# Patient Record
Sex: Female | Born: 1965 | Race: White | Hispanic: No | Marital: Single | Smoking: Current every day smoker
Health system: Southern US, Community
[De-identification: ages and names within clinical notes are randomized; demographics above are authoritative.]

## PROBLEM LIST (undated history)

## (undated) DIAGNOSIS — B269 Mumps without complication: Secondary | ICD-10-CM

## (undated) DIAGNOSIS — Z9109 Other allergy status, other than to drugs and biological substances: Secondary | ICD-10-CM

## (undated) DIAGNOSIS — K219 Gastro-esophageal reflux disease without esophagitis: Secondary | ICD-10-CM

## (undated) DIAGNOSIS — R87629 Unspecified abnormal cytological findings in specimens from vagina: Secondary | ICD-10-CM

## (undated) DIAGNOSIS — J302 Other seasonal allergic rhinitis: Secondary | ICD-10-CM

## (undated) DIAGNOSIS — B059 Measles without complication: Secondary | ICD-10-CM

## (undated) DIAGNOSIS — B019 Varicella without complication: Secondary | ICD-10-CM

## (undated) DIAGNOSIS — I10 Essential (primary) hypertension: Secondary | ICD-10-CM

## (undated) HISTORY — PX: WISDOM TOOTH EXTRACTION: SHX21

## (undated) HISTORY — DX: Unspecified abnormal cytological findings in specimens from vagina: R87.629

## (undated) HISTORY — DX: Mumps without complication: B26.9

## (undated) HISTORY — DX: Other allergy status, other than to drugs and biological substances: Z91.09

## (undated) HISTORY — DX: Gastro-esophageal reflux disease without esophagitis: K21.9

## (undated) HISTORY — DX: Other seasonal allergic rhinitis: J30.2

## (undated) HISTORY — DX: Measles without complication: B05.9

## (undated) HISTORY — PX: ESSURE TUBAL LIGATION: SUR464

## (undated) HISTORY — DX: Varicella without complication: B01.9

---
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
## 2014-02-23 ENCOUNTER — Ambulatory Visit (INDEPENDENT_AMBULATORY_CARE_PROVIDER_SITE_OTHER): Payer: 59 | Admitting: Physician Assistant

## 2014-02-23 ENCOUNTER — Encounter: Payer: Self-pay | Admitting: Physician Assistant

## 2014-02-23 VITALS — BP 128/88 | HR 82 | Temp 98.5°F | Resp 16 | Ht 64.0 in | Wt 198.0 lb

## 2014-02-23 DIAGNOSIS — J329 Chronic sinusitis, unspecified: Secondary | ICD-10-CM | POA: Insufficient documentation

## 2014-02-23 DIAGNOSIS — K219 Gastro-esophageal reflux disease without esophagitis: Secondary | ICD-10-CM | POA: Insufficient documentation

## 2014-02-23 DIAGNOSIS — Z1239 Encounter for other screening for malignant neoplasm of breast: Secondary | ICD-10-CM

## 2014-02-23 DIAGNOSIS — F429 Obsessive-compulsive disorder, unspecified: Secondary | ICD-10-CM | POA: Insufficient documentation

## 2014-02-23 DIAGNOSIS — Z1211 Encounter for screening for malignant neoplasm of colon: Secondary | ICD-10-CM

## 2014-02-23 DIAGNOSIS — Z0000 Encounter for general adult medical examination without abnormal findings: Secondary | ICD-10-CM | POA: Insufficient documentation

## 2014-02-23 LAB — CBC WITH DIFFERENTIAL/PLATELET
BASOS PCT: 1 % (ref 0–1)
Basophils Absolute: 0.1 10*3/uL (ref 0.0–0.1)
EOS ABS: 0.1 10*3/uL (ref 0.0–0.7)
EOS PCT: 1 % (ref 0–5)
HCT: 39.1 % (ref 36.0–46.0)
HEMOGLOBIN: 12.8 g/dL (ref 12.0–15.0)
Lymphocytes Relative: 31 % (ref 12–46)
Lymphs Abs: 2.9 10*3/uL (ref 0.7–4.0)
MCH: 28.6 pg (ref 26.0–34.0)
MCHC: 32.7 g/dL (ref 30.0–36.0)
MCV: 87.5 fL (ref 78.0–100.0)
MONOS PCT: 7 % (ref 3–12)
Monocytes Absolute: 0.6 10*3/uL (ref 0.1–1.0)
Neutro Abs: 5.5 10*3/uL (ref 1.7–7.7)
Neutrophils Relative %: 60 % (ref 43–77)
Platelets: 189 10*3/uL (ref 150–400)
RBC: 4.47 MIL/uL (ref 3.87–5.11)
RDW: 14.3 % (ref 11.5–15.5)
WBC: 9.2 10*3/uL (ref 4.0–10.5)

## 2014-02-23 LAB — HEPATIC FUNCTION PANEL
ALK PHOS: 78 U/L (ref 39–117)
ALT: 21 U/L (ref 0–35)
AST: 25 U/L (ref 0–37)
Albumin: 4.1 g/dL (ref 3.5–5.2)
BILIRUBIN TOTAL: 0.2 mg/dL (ref 0.2–1.2)
Bilirubin, Direct: <0.1 mg/dL (ref 0.0–0.3)
TOTAL PROTEIN: 6.7 g/dL (ref 6.0–8.3)

## 2014-02-23 LAB — LIPID PANEL
Cholesterol: 246 mg/dL — ABNORMAL HIGH (ref 0–200)
HDL: 53 mg/dL (ref >39)
LDL Cholesterol: 151 mg/dL — ABNORMAL HIGH (ref 0–99)
Total CHOL/HDL Ratio: 4.6 Ratio
Triglycerides: 209 mg/dL — ABNORMAL HIGH (ref <150)
VLDL: 42 mg/dL — ABNORMAL HIGH (ref 0–40)

## 2014-02-23 LAB — BASIC METABOLIC PANEL
BUN: 11 mg/dL (ref 6–23)
CALCIUM: 8.9 mg/dL (ref 8.4–10.5)
CO2: 28 meq/L (ref 19–32)
CREATININE: 0.65 mg/dL (ref 0.50–1.10)
Chloride: 103 mEq/L (ref 96–112)
GLUCOSE: 89 mg/dL (ref 70–99)
Potassium: 4.5 mEq/L (ref 3.5–5.3)
SODIUM: 139 meq/L (ref 135–145)

## 2014-02-23 LAB — HEMOGLOBIN A1C
Hgb A1c MFr Bld: 5.5 % (ref <5.7)
MEAN PLASMA GLUCOSE: 111 mg/dL (ref <117)

## 2014-02-23 LAB — TSH: TSH: 0.698 u[IU]/mL (ref 0.350–4.500)

## 2014-02-23 MED ORDER — AZITHROMYCIN 250 MG PO TABS: ORAL_TABLET | ORAL | Status: DC

## 2014-02-23 MED ORDER — FLUTICASONE PROPIONATE 50 MCG/ACT NA SUSP: 2.0000 | Freq: Every day | NASAL | Status: DC

## 2014-02-23 MED ORDER — OMEPRAZOLE 20 MG PO CPDR: 20.0000 mg | DELAYED_RELEASE_CAPSULE | Freq: Every day | ORAL | Status: DC

## 2014-02-23 MED ORDER — METHYLPREDNISOLONE ACETATE 40 MG/ML IJ SUSP
40.0000 mg | Freq: Once | INTRAMUSCULAR | Status: AC
  Administered 2014-02-23: 40 mg via INTRAMUSCULAR

## 2014-02-23 NOTE — Assessment & Plan Note (Signed)
History reviewed.  Referral placed for Mammogram and Colonoscopy. Immunizations UTD.  Will obtain fasting labs.  Patient to return for pelvic examination and PAP smear.

## 2014-02-23 NOTE — Progress Notes (Signed)
Pre visit review using our clinic review tool, if applicable. No additional management support is needed unless otherwise documented below in the visit note/SLS  

## 2014-02-23 NOTE — Assessment & Plan Note (Signed)
Symptoms controlled with Prozac.

## 2014-02-23 NOTE — Assessment & Plan Note (Signed)
Rx Azithromycin.  Increase fluids. Rest.  Saline nasal spray.  Flonase and Claritin.  Humidifier in bedroom.  Depo-Medrol injection given.

## 2014-02-23 NOTE — Patient Instructions (Signed)
Please obtain labs.  I will call you with your results. Please start Omeprazole daily.  Decrease alcohol intake.  Read information below on smoking cessation.  STOP BC Powders.  Use tylenol if needed for headache.  For sinusitis -- Increase fluid intake.  Rest.  Take anitbiotic as prescribed.  Use Flonase daily.  Place a humidifier in the bedroom.  For OCD -- Continue Prozac as directed.  Will schedule follow-up based on lab results.  Smoking Cessation Quitting smoking is important to your health and has many advantages. However, it is not always easy to quit since nicotine is a very addictive drug. Often times, people try 3 times or more before being able to quit. This document explains the best ways for you to prepare to quit smoking. Quitting takes hard work and a lot of effort, but you can do it. ADVANTAGES OF QUITTING SMOKING  You will live longer, feel better, and live better.  Your body will feel the impact of quitting smoking almost immediately.  Within 20 minutes, blood pressure decreases. Your pulse returns to its normal level.  After 8 hours, carbon monoxide levels in the blood return to normal. Your oxygen level increases.  After 24 hours, the chance of having a heart attack starts to decrease. Your breath, hair, and body stop smelling like smoke.  After 48 hours, damaged nerve endings begin to recover. Your sense of taste and smell improve.  After 72 hours, the body is virtually free of nicotine. Your bronchial tubes relax and breathing becomes easier.  After 2 to 12 weeks, lungs can hold more air. Exercise becomes easier and circulation improves.  The risk of having a heart attack, stroke, cancer, or lung disease is greatly reduced.  After 1 year, the risk of coronary heart disease is cut in half.  After 5 years, the risk of stroke falls to the same as a nonsmoker.  After 10 years, the risk of lung cancer is cut in half and the risk of other cancers decreases  significantly.  After 15 years, the risk of coronary heart disease drops, usually to the level of a nonsmoker.  If you are pregnant, quitting smoking will improve your chances of having a healthy baby.  The people you live with, especially any children, will be healthier.  You will have extra money to spend on things other than cigarettes. QUESTIONS TO THINK ABOUT BEFORE ATTEMPTING TO QUIT You may want to talk about your answers with your caregiver.  Why do you want to quit?  If you tried to quit in the past, what helped and what did not?  What will be the most difficult situations for you after you quit? How will you plan to handle them?  Who can help you through the tough times? Your family? Friends? A caregiver?  What pleasures do you get from smoking? What ways can you still get pleasure if you quit? Here are some questions to ask your caregiver:  How can you help me to be successful at quitting?  What medicine do you think would be best for me and how should I take it?  What should I do if I need more help?  What is smoking withdrawal like? How can I get information on withdrawal? GET READY  Set a quit date.  Change your environment by getting rid of all cigarettes, ashtrays, matches, and lighters in your home, car, or work. Do not let people smoke in your home.  Review your past attempts to quit.  Think about what worked and what did not. GET SUPPORT AND ENCOURAGEMENT You have a better chance of being successful if you have help. You can get support in many ways.  Tell your family, friends, and co-workers that you are going to quit and need their support. Ask them not to smoke around you.  Get individual, group, or telephone counseling and support. Programs are available at [employer] and health centers. Call your local health department for information about programs in your area.  Spiritual beliefs and practices may help some smokers quit.  Download a "quit  meter" on your computer to keep track of quit statistics, such as how long you have gone without smoking, cigarettes not smoked, and money saved.  Get a self-help book about quitting smoking and staying off of tobacco. Boyle yourself from urges to smoke. Talk to someone, go for a walk, or occupy your time with a task.  Change your normal routine. Take a different route to work. Drink tea instead of coffee. Eat breakfast in a different place.  Reduce your stress. Take a hot bath, exercise, or read a book.  Plan something enjoyable to do every day. Reward yourself for not smoking.  Explore interactive web-based programs that specialize in helping you quit. GET MEDICINE AND USE IT CORRECTLY Medicines can help you stop smoking and decrease the urge to smoke. Combining medicine with the above behavioral methods and support can greatly increase your chances of successfully quitting smoking.  Nicotine replacement therapy helps deliver nicotine to your body without the negative effects and risks of smoking. Nicotine replacement therapy includes nicotine gum, lozenges, inhalers, nasal sprays, and skin patches. Some may be available over-the-counter and others require a prescription.  Antidepressant medicine helps people abstain from smoking, but how this works is unknown. This medicine is available by prescription.  Nicotinic receptor partial agonist medicine simulates the effect of nicotine in your brain. This medicine is available by prescription. Ask your caregiver for advice about which medicines to use and how to use them based on your health history. Your caregiver will tell you what side effects to look out for if you choose to be on a medicine or therapy. Carefully read the information on the package. Do not use any other product containing nicotine while using a nicotine replacement product.  RELAPSE OR DIFFICULT SITUATIONS Most relapses occur within the  first 3 months after quitting. Do not be discouraged if you start smoking again. Remember, most people try several times before finally quitting. You may have symptoms of withdrawal because your body is used to nicotine. You may crave cigarettes, be irritable, feel very hungry, cough often, get headaches, or have difficulty concentrating. The withdrawal symptoms are only temporary. They are strongest when you first quit, but they will go away within 10 14 days. To reduce the chances of relapse, try to:  Avoid drinking alcohol. Drinking lowers your chances of successfully quitting.  Reduce the amount of caffeine you consume. Once you quit smoking, the amount of caffeine in your body increases and can give you symptoms, such as a rapid heartbeat, sweating, and anxiety.  Avoid smokers because they can make you want to smoke.  Do not let weight gain distract you. Many smokers will gain weight when they quit, usually less than 10 pounds. Eat a healthy diet and stay active. You can always lose the weight gained after you quit.  Find ways to improve your mood other than smoking.  FOR MORE INFORMATION  www.smokefree.gov  Document Released: 10/14/2001 Document Revised: 04/20/2012 Document Reviewed: 01/29/2012 [facility] Patient Information 2014 [place], [place].  Preventive Care for Adults, Female A healthy lifestyle and preventive care can promote health and wellness. Preventive health guidelines for women include the following key practices.  A routine yearly physical is a good way to check with your health care provider about your health and preventive screening. It is a chance to share any concerns and updates on your health and to receive a thorough exam.  Visit your dentist for a routine exam and preventive care every 6 months. Brush your teeth twice a day and floss once a day. Good oral hygiene prevents tooth decay and gum disease.  The frequency of eye exams is based on your age, health, family  medical history, use of contact lenses, and other factors. Follow your health care provider's recommendations for frequency of eye exams.  Eat a healthy diet. Foods like vegetables, fruits, whole grains, low-fat dairy products, and lean protein foods contain the nutrients you need without too many calories. Decrease your intake of foods high in solid fats, added sugars, and salt. Eat the right amount of calories for you.Get information about a proper diet from your health care provider, if necessary.  Regular physical exercise is one of the most important things you can do for your health. Most adults should get at least 150 minutes of moderate-intensity exercise (any activity that increases your heart rate and causes you to sweat) each week. In addition, most adults need muscle-strengthening exercises on 2 or more days a week.  Maintain a healthy weight. The body mass index (BMI) is a screening tool to identify possible weight problems. It provides an estimate of body fat based on height and weight. Your health care provider can find your BMI, and can help you achieve or maintain a healthy weight.For adults 20 years and older:  A BMI below 18.5 is considered underweight.  A BMI of 18.5 to 24.9 is normal.  A BMI of 25 to 29.9 is considered overweight.  A BMI of 30 and above is considered obese.  Maintain normal blood lipids and cholesterol levels by exercising and minimizing your intake of saturated fat. Eat a balanced diet with plenty of fruit and vegetables. Blood tests for lipids and cholesterol should begin at age 46 and be repeated every 5 years. If your lipid or cholesterol levels are high, you are over 50, or you are at high risk for heart disease, you may need your cholesterol levels checked more frequently.Ongoing high lipid and cholesterol levels should be treated with medicines if diet and exercise are not working.  If you smoke, find out from your health care provider how to quit. If  you do not use tobacco, do not start.  Lung cancer screening is recommended for adults aged 61 80 years who are at high risk for developing lung cancer because of a history of smoking. A yearly low-dose CT scan of the lungs is recommended for people who have at least a 30-pack-year history of smoking and are a current smoker or have quit within the past 15 years. A pack year of smoking is smoking an average of 1 pack of cigarettes a day for 1 year (for example: 1 pack a day for 30 years or 2 packs a day for 15 years). Yearly screening should continue until the smoker has stopped smoking for at least 15 years. Yearly screening should be stopped for people who develop  a health problem that would prevent them from having lung cancer treatment.  If you are pregnant, do not drink alcohol. If you are breastfeeding, be very cautious about drinking alcohol. If you are not pregnant and choose to drink alcohol, do not have more than 1 drink per day. One drink is considered to be 12 ounces (355 mL) of beer, 5 ounces (148 mL) of wine, or 1.5 ounces (44 mL) of liquor.  Avoid use of street drugs. Do not share needles with anyone. Ask for help if you need support or instructions about stopping the use of drugs.  High blood pressure causes heart disease and increases the risk of stroke. Your blood pressure should be checked at least every 1 to 2 years. Ongoing high blood pressure should be treated with medicines if weight loss and exercise do not work.  If you are 8 50 years old, ask your health care provider if you should take aspirin to prevent strokes.  Diabetes screening involves taking a blood sample to check your fasting blood sugar level. This should be done once every 3 years, after age 89, if you are within normal weight and without risk factors for diabetes. Testing should be considered at a younger age or be carried out more frequently if you are overweight and have at least 1 risk factor for  diabetes.  Breast cancer screening is essential preventive care for women. You should practice "breast self-awareness." This means understanding the normal appearance and feel of your breasts and may include breast self-examination. Any changes detected, no matter how small, should be reported to a health care provider. Women in their 29s and 30s should have a clinical breast exam (CBE) by a health care provider as part of a regular health exam every 1 to 3 years. After age 40, women should have a CBE every year. Starting at age 52, women should consider having a mammogram (breast X-ray test) every year. Women who have a family history of breast cancer should talk to their health care provider about genetic screening. Women at a high risk of breast cancer should talk to their health care providers about having an MRI and a mammogram every year.  Breast cancer gene (BRCA)-related cancer risk assessment is recommended for women who have family members with BRCA-related cancers. BRCA-related cancers include breast, ovarian, tubal, and peritoneal cancers. Having family members with these cancers may be associated with an increased risk for harmful changes (mutations) in the breast cancer genes BRCA1 and BRCA2. Results of the assessment will determine the need for genetic counseling and BRCA1 and BRCA2 testing.  The Pap test is a screening test for cervical cancer. A Pap test can show cell changes on the cervix that might become cervical cancer if left untreated. A Pap test is a procedure in which cells are obtained and examined from the lower end of the uterus (cervix).  Women should have a Pap test starting at age 41.  Between ages 27 and 72, Pap tests should be repeated every 2 years.  Beginning at age 48, you should have a Pap test every 3 years as long as the past 3 Pap tests have been normal.  Some women have medical problems that increase the chance of getting cervical cancer. Talk to your health  care provider about these problems. It is especially important to talk to your health care provider if a new problem develops soon after your last Pap test. In these cases, your health care provider may recommend  more frequent screening and Pap tests.  The above recommendations are the same for women who have or have not gotten the vaccine for human papillomavirus (HPV).  If you had a hysterectomy for a problem that was not cancer or a condition that could lead to cancer, then you no longer need Pap tests. Even if you no longer need a Pap test, a regular exam is a good idea to make sure no other problems are starting.  If you are between ages 12 and 28 years, and you have had normal Pap tests going back 10 years, you no longer need Pap tests. Even if you no longer need a Pap test, a regular exam is a good idea to make sure no other problems are starting.  If you have had past treatment for cervical cancer or a condition that could lead to cancer, you need Pap tests and screening for cancer for at least 20 years after your treatment.  If Pap tests have been discontinued, risk factors (such as a new sexual partner) need to be reassessed to determine if screening should be resumed.  The HPV test is an additional test that may be used for cervical cancer screening. The HPV test looks for the virus that can cause the cell changes on the cervix. The cells collected during the Pap test can be tested for HPV. The HPV test could be used to screen women aged 82 years and older, and should be used in women of any age who have unclear Pap test results. After the age of 85, women should have HPV testing at the same frequency as a Pap test.  Colorectal cancer can be detected and often prevented. Most routine colorectal cancer screening begins at the age of 105 years and continues through age 56 years. However, your health care provider may recommend screening at an earlier age if you have risk factors for colon  cancer. On a yearly basis, your health care provider may provide home test kits to check for hidden blood in the stool. Use of a small camera at the end of a tube, to directly examine the colon (sigmoidoscopy or colonoscopy), can detect the earliest forms of colorectal cancer. Talk to your health care provider about this at age 5, when routine screening begins. Direct exam of the colon should be repeated every 5 10 years through age 39 years, unless early forms of pre-cancerous polyps or small growths are found.  People who are at an increased risk for hepatitis B should be screened for this virus. You are considered at high risk for hepatitis B if:  You were born in a country where hepatitis B occurs often. Talk with your health care provider about which countries are considered high risk.  Your parents were born in a high-risk country and you have not received a shot to protect against hepatitis B (hepatitis B vaccine).  You have HIV or AIDS.  You use needles to inject street drugs.  You live with, or have sex with, someone who has Hepatitis B.  You get hemodialysis treatment.  You take certain medicines for conditions like cancer, organ transplantation, and autoimmune conditions.  Hepatitis C blood testing is recommended for all people born from [birth year] through 1965 and any individual with known risks for hepatitis C.  Practice safe sex. Use condoms and avoid high-risk sexual practices to reduce the spread of sexually transmitted infections (STIs). STIs include gonorrhea, chlamydia, syphilis, trichomonas, herpes, HPV, and human immunodeficiency virus (HIV). Herpes, HIV,  and HPV are viral illnesses that have no cure. They can result in disability, cancer, and death. Sexually active women aged 59 years and younger should be checked for chlamydia. Older women with new or multiple partners should also be tested for chlamydia. Testing for other STIs is recommended if you are sexually active and at  increased risk.  Osteoporosis is a disease in which the bones lose minerals and strength with aging. This can result in serious bone fractures or breaks. The risk of osteoporosis can be identified using a bone density scan. Women ages 55 years and over and women at risk for fractures or osteoporosis should discuss screening with their health care providers. Ask your health care provider whether you should take a calcium supplement or vitamin D to reduce the rate of osteoporosis.  Menopause can be associated with physical symptoms and risks. Hormone replacement therapy is available to decrease symptoms and risks. You should talk to your health care provider about whether hormone replacement therapy is right for you.  Use sunscreen. Apply sunscreen liberally and repeatedly throughout the day. You should seek shade when your shadow is shorter than you. Protect yourself by wearing long sleeves, pants, a wide-brimmed hat, and sunglasses year round, whenever you are outdoors.  Once a month, do a whole body skin exam, using a mirror to look at the skin on your back. Tell your health care provider of new moles, moles that have irregular borders, moles that are larger than a pencil eraser, or moles that have changed in shape or color.  Stay current with required vaccines (immunizations).  Influenza vaccine. All adults should be immunized every year.  Tetanus, diphtheria, and acellular pertussis (Td, Tdap) vaccine. Pregnant women should receive 1 dose of Tdap vaccine during each pregnancy. The dose should be obtained regardless of the length of time since the last dose. Immunization is preferred during the 27th 36th week of gestation. An adult who has not previously received Tdap or who does not know her vaccine status should receive 1 dose of Tdap. This initial dose should be followed by tetanus and diphtheria toxoids (Td) booster doses every 10 years. Adults with an unknown or incomplete history of completing  a 3-dose immunization series with Td-containing vaccines should begin or complete a primary immunization series including a Tdap dose. Adults should receive a Td booster every 10 years.  Varicella vaccine. An adult without evidence of immunity to varicella should receive 2 doses or a second dose if she has previously received 1 dose. Pregnant females who do not have evidence of immunity should receive the first dose after pregnancy. This first dose should be obtained before leaving the health care facility. The second dose should be obtained 4 8 weeks after the first dose.  Human papillomavirus (HPV) vaccine. Females aged 41 26 years who have not received the vaccine previously should obtain the 3-dose series. The vaccine is not recommended for use in pregnant females. However, pregnancy testing is not needed before receiving a dose. If a female is found to be pregnant after receiving a dose, no treatment is needed. In that case, the remaining doses should be delayed until after the pregnancy. Immunization is recommended for any person with an immunocompromised condition through the age of 75 years if she did not get any or all doses earlier. During the 3-dose series, the second dose should be obtained 4 8 weeks after the first dose. The third dose should be obtained 24 weeks after the first dose and  16 weeks after the second dose.  Zoster vaccine. One dose is recommended for adults aged 62 years or older unless certain conditions are present.  Measles, mumps, and rubella (MMR) vaccine. Adults born before [birth year] generally are considered immune to measles and mumps. Adults born in [birth year] or later should have 1 or more doses of MMR vaccine unless there is a contraindication to the vaccine or there is laboratory evidence of immunity to each of the three diseases. A routine second dose of MMR vaccine should be obtained at least 28 days after the first dose for students attending postsecondary schools, health care  workers, or international travelers. People who received inactivated measles vaccine or an unknown type of measles vaccine during 1963 1967 should receive 2 doses of MMR vaccine. People who received inactivated mumps vaccine or an unknown type of mumps vaccine before 1979 and are at high risk for mumps infection should consider immunization with 2 doses of MMR vaccine. For females of childbearing age, rubella immunity should be determined. If there is no evidence of immunity, females who are not pregnant should be vaccinated. If there is no evidence of immunity, females who are pregnant should delay immunization until after pregnancy. Unvaccinated health care workers born before [birth year] who lack laboratory evidence of measles, mumps, or rubella immunity or laboratory confirmation of disease should consider measles and mumps immunization with 2 doses of MMR vaccine or rubella immunization with 1 dose of MMR vaccine.  Pneumococcal 13-valent conjugate (PCV13) vaccine. When indicated, a person who is uncertain of her immunization history and has no record of immunization should receive the PCV13 vaccine. An adult aged 62 years or older who has certain medical conditions and has not been previously immunized should receive 1 dose of PCV13 vaccine. This PCV13 should be followed with a dose of pneumococcal polysaccharide (PPSV23) vaccine. The PPSV23 vaccine dose should be obtained at least 8 weeks after the dose of PCV13 vaccine. An adult aged 19 years or older who has certain medical conditions and previously received 1 or more doses of PPSV23 vaccine should receive 1 dose of PCV13. The PCV13 vaccine dose should be obtained 1 or more years after the last PPSV23 vaccine dose.  Pneumococcal polysaccharide (PPSV23) vaccine. When PCV13 is also indicated, PCV13 should be obtained first. All adults aged 47 years and older should be immunized. An adult younger than age 52 years who has certain medical conditions should be  immunized. Any person who resides in a nursing home or long-term care facility should be immunized. An adult smoker should be immunized. People with an immunocompromised condition and certain other conditions should receive both PCV13 and PPSV23 vaccines. People with human immunodeficiency virus (HIV) infection should be immunized as soon as possible after diagnosis. Immunization during chemotherapy or radiation therapy should be avoided. Routine use of PPSV23 vaccine is not recommended for American Indians, [place] Natives, or people younger than 65 years unless there are medical conditions that require PPSV23 vaccine. When indicated, people who have unknown immunization and have no record of immunization should receive PPSV23 vaccine. One-time revaccination 5 years after the first dose of PPSV23 is recommended for people aged 14 64 years who have chronic kidney failure, nephrotic syndrome, asplenia, or immunocompromised conditions. People who received 1 2 doses of PPSV23 before age 82 years should receive another dose of PPSV23 vaccine at age 90 years or later if at least 5 years have passed since the previous dose. Doses of PPSV23 are not needed for people immunized  with PPSV23 at or after age 106 years.  Meningococcal vaccine. Adults with asplenia or persistent complement component deficiencies should receive 2 doses of quadrivalent meningococcal conjugate (MenACWY-D) vaccine. The doses should be obtained at least 2 months apart. Microbiologists working with certain meningococcal bacteria, [employer] recruits, people at risk during an outbreak, and people who travel to or live in countries with a high rate of meningitis should be immunized. A first-year college student up through age 73 years who is living in a residence hall should receive a dose if she did not receive a dose on or after her 16th birthday. Adults who have certain high-risk conditions should receive one or more doses of vaccine.  Hepatitis A  vaccine. Adults who wish to be protected from this disease, have certain high-risk conditions, work with hepatitis A-infected animals, work in hepatitis A research labs, or travel to or work in countries with a high rate of hepatitis A should be immunized. Adults who were previously unvaccinated and who anticipate close contact with an international adoptee during the first 60 days after arrival in the [place] from a country with a high rate of hepatitis A should be immunized.  Hepatitis B vaccine. Adults who wish to be protected from this disease, have certain high-risk conditions, may be exposed to blood or other infectious body fluids, are household contacts or sex partners of hepatitis B positive people, are clients or workers in certain care facilities, or travel to or work in countries with a high rate of hepatitis B should be immunized.  Haemophilus influenzae type b (Hib) vaccine. A previously unvaccinated person with asplenia or sickle cell disease or having a scheduled splenectomy should receive 1 dose of Hib vaccine. Regardless of previous immunization, a recipient of a hematopoietic stem cell transplant should receive a 3-dose series 6 12 months after her successful transplant. Hib vaccine is not recommended for adults with HIV infection. Preventive Services / Frequency Ages 54 to 39years  Blood pressure check.** / Every 1 to 2 years.  Lipid and cholesterol check.** / Every 5 years beginning at age 69.  Clinical breast exam.** / Every 3 years for women in their 5s and 15s.  BRCA-related cancer risk assessment.** / For women who have family members with a BRCA-related cancer (breast, ovarian, tubal, or peritoneal cancers).  Pap test.** / Every 2 years from ages 27 through 28. Every 3 years starting at age 64 through age 19 or 43 with a history of 3 consecutive normal Pap tests.  HPV screening.** / Every 3 years from ages 25 through ages 77 to 68 with a history of 3 consecutive  normal Pap tests.  Hepatitis C blood test.** / For any individual with known risks for hepatitis C.  Skin self-exam. / Monthly.  Influenza vaccine. / Every year.  Tetanus, diphtheria, and acellular pertussis (Tdap, Td) vaccine.** / Consult your health care provider. Pregnant women should receive 1 dose of Tdap vaccine during each pregnancy. 1 dose of Td every 10 years.  Varicella vaccine.** / Consult your health care provider. Pregnant females who do not have evidence of immunity should receive the first dose after pregnancy.  HPV vaccine. / 3 doses over 6 months, if 19 and younger. The vaccine is not recommended for use in pregnant females. However, pregnancy testing is not needed before receiving a dose.  Measles, mumps, rubella (MMR) vaccine.** / You need at least 1 dose of MMR if you were born in 1957 or later. You may also need a  2nd dose. For females of childbearing age, rubella immunity should be determined. If there is no evidence of immunity, females who are not pregnant should be vaccinated. If there is no evidence of immunity, females who are pregnant should delay immunization until after pregnancy.  Pneumococcal 13-valent conjugate (PCV13) vaccine.** / Consult your health care provider.  Pneumococcal polysaccharide (PPSV23) vaccine.** / 1 to 2 doses if you smoke cigarettes or if you have certain conditions.  Meningococcal vaccine.** / 1 dose if you are age 26 to 64 years and a Market researcher living in a residence hall, or have one of several medical conditions, you need to get vaccinated against meningococcal disease. You may also need additional booster doses.  Hepatitis A vaccine.** / Consult your health care provider.  Hepatitis B vaccine.** / Consult your health care provider.  Haemophilus influenzae type b (Hib) vaccine.** / Consult your health care provider. Ages 24 to 64years  Blood pressure check.** / Every 1 to 2 years.  Lipid and cholesterol check.** /  Every 5 years beginning at age 15 years.  Lung cancer screening. / Every year if you are aged 22 80 years and have a 30-pack-year history of smoking and currently smoke or have quit within the past 15 years. Yearly screening is stopped once you have quit smoking for at least 15 years or develop a health problem that would prevent you from having lung cancer treatment.  Clinical breast exam.** / Every year after age 14 years.  BRCA-related cancer risk assessment.** / For women who have family members with a BRCA-related cancer (breast, ovarian, tubal, or peritoneal cancers).  Mammogram.** / Every year beginning at age 53 years and continuing for as long as you are in good health. Consult with your health care provider.  Pap test.** / Every 3 years starting at age 44 years through age 87 or 69 years with a history of 3 consecutive normal Pap tests.  HPV screening.** / Every 3 years from ages 75 years through ages 64 to 79 years with a history of 3 consecutive normal Pap tests.  Fecal occult blood test (FOBT) of stool. / Every year beginning at age 34 years and continuing until age 7 years. You may not need to do this test if you get a colonoscopy every 10 years.  Flexible sigmoidoscopy or colonoscopy.** / Every 5 years for a flexible sigmoidoscopy or every 10 years for a colonoscopy beginning at age 26 years and continuing until age 65 years.  Hepatitis C blood test.** / For all people born from [birth year] through 1965 and any individual with known risks for hepatitis C.  Skin self-exam. / Monthly.  Influenza vaccine. / Every year.  Tetanus, diphtheria, and acellular pertussis (Tdap/Td) vaccine.** / Consult your health care provider. Pregnant women should receive 1 dose of Tdap vaccine during each pregnancy. 1 dose of Td every 10 years.  Varicella vaccine.** / Consult your health care provider. Pregnant females who do not have evidence of immunity should receive the first dose after  pregnancy.  Zoster vaccine.** / 1 dose for adults aged 79 years or older.  Measles, mumps, rubella (MMR) vaccine.** / You need at least 1 dose of MMR if you were born in 1957 or later. You may also need a 2nd dose. For females of childbearing age, rubella immunity should be determined. If there is no evidence of immunity, females who are not pregnant should be vaccinated. If there is no evidence of immunity, females who are pregnant should delay  immunization until after pregnancy.  Pneumococcal 13-valent conjugate (PCV13) vaccine.** / Consult your health care provider.  Pneumococcal polysaccharide (PPSV23) vaccine.** / 1 to 2 doses if you smoke cigarettes or if you have certain conditions.  Meningococcal vaccine.** / Consult your health care provider.  Hepatitis A vaccine.** / Consult your health care provider.  Hepatitis B vaccine.** / Consult your health care provider.  Haemophilus influenzae type b (Hib) vaccine.** / Consult your health care provider. Ages 52 years and over  Blood pressure check.** / Every 1 to 2 years.  Lipid and cholesterol check.** / Every 5 years beginning at age 67 years.  Lung cancer screening. / Every year if you are aged 1 80 years and have a 30-pack-year history of smoking and currently smoke or have quit within the past 15 years. Yearly screening is stopped once you have quit smoking for at least 15 years or develop a health problem that would prevent you from having lung cancer treatment.  Clinical breast exam.** / Every year after age 83 years.  BRCA-related cancer risk assessment.** / For women who have family members with a BRCA-related cancer (breast, ovarian, tubal, or peritoneal cancers).  Mammogram.** / Every year beginning at age 74 years and continuing for as long as you are in good health. Consult with your health care provider.  Pap test.** / Every 3 years starting at age 59 years through age 34 or 93 years with 3 consecutive normal Pap  tests. Testing can be stopped between 65 and 70 years with 3 consecutive normal Pap tests and no abnormal Pap or HPV tests in the past 10 years.  HPV screening.** / Every 3 years from ages 59 years through ages 9 or 56 years with a history of 3 consecutive normal Pap tests. Testing can be stopped between 65 and 70 years with 3 consecutive normal Pap tests and no abnormal Pap or HPV tests in the past 10 years.  Fecal occult blood test (FOBT) of stool. / Every year beginning at age 61 years and continuing until age 89 years. You may not need to do this test if you get a colonoscopy every 10 years.  Flexible sigmoidoscopy or colonoscopy.** / Every 5 years for a flexible sigmoidoscopy or every 10 years for a colonoscopy beginning at age 110 years and continuing until age 29 years.  Hepatitis C blood test.** / For all people born from [birth year] through 1967 and any individual with known risks for hepatitis C.  Osteoporosis screening.** / A one-time screening for women ages 51 years and over and women at risk for fractures or osteoporosis.  Skin self-exam. / Monthly.  Influenza vaccine. / Every year.  Tetanus, diphtheria, and acellular pertussis (Tdap/Td) vaccine.** / 1 dose of Td every 10 years.  Varicella vaccine.** / Consult your health care provider.  Zoster vaccine.** / 1 dose for adults aged 44 years or older.  Pneumococcal 13-valent conjugate (PCV13) vaccine.** / Consult your health care provider.  Pneumococcal polysaccharide (PPSV23) vaccine.** / 1 dose for all adults aged 42 years and older.  Meningococcal vaccine.** / Consult your health care provider.  Hepatitis A vaccine.** / Consult your health care provider.  Hepatitis B vaccine.** / Consult your health care provider.  Haemophilus influenzae type b (Hib) vaccine.** / Consult your health care provider. ** Family history and personal history of risk and conditions may change your health care provider's recommendations. Document  Released: 12/16/2001 Document Revised: 08/10/2013 Document Reviewed: 03/17/2011 [facility] Patient Information 2014 [place], [place].

## 2014-02-23 NOTE — Progress Notes (Signed)
Patient presents to clinic today to establish care.  Acute Concerns: Patient complains of sinus pressure, sinus pain, headache and post-nasal drip over the past month.  Does have some seasonal allergies.  Endorses tooth pain but denies ear pain.  Denies recent travel or sick contact.  Chronic Issues: Anxiety/OCD -- controlled with 60 mg Prozac daily  Allergic Rhinitis -- controlled with Claritin.    GERD -- has been taking zantac without adequate relief of symptoms.  Endorses indigestion.  Denies abdominal pain, nausea or vomiting.  Is a current smoker.  Does drink alcohol occasionally. Has been using BC powders.  Health Maintenance: Dental -- Overdue Vision -- Overdue Immunizations --  Endorses Tetanus within past 10 years.  2010. Colonoscopy -- Due for colonoscopy.  Referral needed. Mammogram -- Last mammogram 4-5 years ago.  Due mammogram.   PAP --  Last 5 years ago; last 3 PAPs normal.  Past Medical History  Diagnosis Date  . GERD (gastroesophageal reflux disease)   . Seasonal allergies   . Environmental allergies   . Chicken pox   . Mumps   . Measles     Past Surgical History  Procedure Laterality Date  . Tonsillectomy  1984  . Wisdom tooth extraction    . Essure tubal ligation      No current outpatient prescriptions on file prior to visit.   No current facility-administered medications on file prior to visit.    Allergies  Allergen Reactions  . Penicillins Anaphylaxis    Family History  Problem Relation Age of Onset  . Healthy Mother     Living  . Healthy Father     Living  . Diabetes Maternal Grandfather     Borderline  . COPD Maternal Aunt   . COPD Maternal Uncle   . Alzheimer's disease Maternal Grandmother   . Alzheimer's disease Paternal Grandfather   . Healthy Brother     x1  . Allergies Son     x1    History   Social History  . Marital Status: Single    Spouse Name: N/A    Number of Children: N/A  . Years of Education: N/A    Occupational History  . Not on file.   Social History Main Topics  . Smoking status: Current Every Day Smoker -- 1.00 packs/day for 30 years  . Smokeless tobacco: Never Used  . Alcohol Use: 6.0 oz/week    10 Shots of liquor per week  . Drug Use: No  . Sexual Activity: No   Other Topics Concern  . Not on file   Social History Narrative  . No narrative on file   Review of Systems  Constitutional: Negative for fever and weight loss.  HENT: Negative for ear pain, hearing loss and tinnitus.   Eyes: Negative for blurred vision, double vision and photophobia.  Respiratory: Negative for cough and shortness of breath.   Cardiovascular: Negative for chest pain and palpitations.  Gastrointestinal: Positive for heartburn. Negative for nausea, vomiting, abdominal pain, diarrhea, constipation, blood in stool and melena.  Genitourinary: Negative for dysuria, urgency, frequency, hematuria and flank pain.  Neurological: Positive for headaches. Negative for dizziness and loss of consciousness.  Endo/Heme/Allergies: Positive for environmental allergies.  Psychiatric/Behavioral: Negative for depression, suicidal ideas, hallucinations and substance abuse. The patient is nervous/anxious. The patient does not have insomnia.    BP 128/88  Pulse 82  Temp(Src) 98.5 F (36.9 C) (Oral)  Resp 16  Ht 5\' 4"  (1.626 m)  Wt 198 lb (  89.812 kg)  BMI 33.97 kg/m2  SpO2 98%  Physical Exam  Vitals reviewed. Constitutional: She is oriented to person, place, and time and well-developed, well-nourished, and in no distress.  HENT:  Head: Normocephalic and atraumatic.  Right Ear: External ear normal.  Left Ear: External ear normal.  Nose: Nose normal.  Mouth/Throat: Oropharynx is clear and moist. No oropharyngeal exudate.  TM within normal limits bilaterally.  + TTP of maxillary sinuses noted.  Eyes: Conjunctivae are normal. Pupils are equal, round, and reactive to light.  Neck: Neck supple.   Cardiovascular: Normal rate, regular rhythm, normal heart sounds and intact distal pulses.   Pulmonary/Chest: Effort normal and breath sounds normal. No respiratory distress. She has no wheezes. She has no rales. She exhibits no tenderness.  Abdominal: Soft. Bowel sounds are normal. She exhibits no distension and no mass. There is no tenderness. There is no rebound and no guarding.  Neurological: She is alert and oriented to person, place, and time.  Skin: Skin is warm and dry. No rash noted.  Psychiatric: Affect normal.   Assessment/Plan: GERD (gastroesophageal reflux disease) STOP BC powders.  Rx Omperazole.  Increase fluid intake.  Avoid late-night eating.  Elevate HOB.    Visit for preventive health examination History reviewed.  Referral placed for Mammogram and Colonoscopy. Immunizations UTD.  Will obtain fasting labs.  Patient to return for pelvic examination and PAP smear.  Sinusitis Rx Azithromycin.  Increase fluids. Rest.  Saline nasal spray.  Flonase and Claritin.  Humidifier in bedroom.  Depo-Medrol injection given.  OCD (obsessive compulsive disorder) Symptoms controlled with Prozac.

## 2014-02-23 NOTE — Assessment & Plan Note (Signed)
STOP BC powders.  Rx Omperazole.  Increase fluid intake.  Avoid late-night eating.  Elevate HOB.

## 2014-02-24 ENCOUNTER — Telehealth: Payer: Self-pay | Admitting: Physician Assistant

## 2014-02-24 LAB — URINALYSIS, ROUTINE W REFLEX MICROSCOPIC
Bilirubin Urine: NEGATIVE
Glucose, UA: NEGATIVE mg/dL
HGB URINE DIPSTICK: NEGATIVE
Ketones, ur: NEGATIVE mg/dL
Leukocytes, UA: NEGATIVE
NITRITE: NEGATIVE
Protein, ur: NEGATIVE mg/dL
Specific Gravity, Urine: 1.027 (ref 1.005–1.030)
UROBILINOGEN UA: 0.2 mg/dL (ref 0.0–1.0)
pH: 7 (ref 5.0–8.0)

## 2014-02-24 NOTE — Telephone Encounter (Signed)
Relevant patient education assigned to patient using Emmi. ° °

## 2014-03-13 ENCOUNTER — Telehealth: Payer: Self-pay | Admitting: *Deleted

## 2014-03-13 DIAGNOSIS — F988 Other specified behavioral and emotional disorders with onset usually occurring in childhood and adolescence: Secondary | ICD-10-CM

## 2014-03-13 DIAGNOSIS — F32A Depression, unspecified: Secondary | ICD-10-CM

## 2014-03-13 DIAGNOSIS — F329 Major depressive disorder, single episode, unspecified: Secondary | ICD-10-CM

## 2014-03-13 DIAGNOSIS — F419 Anxiety disorder, unspecified: Principal | ICD-10-CM

## 2014-03-13 MED ORDER — FLUOXETINE HCL 20 MG PO CAPS: 20.0000 mg | ORAL_CAPSULE | Freq: Every day | ORAL | Status: DC

## 2014-03-13 MED ORDER — FLUOXETINE HCL 40 MG PO CAPS: 40.0000 mg | ORAL_CAPSULE | Freq: Every day | ORAL | Status: DC

## 2014-03-13 NOTE — Telephone Encounter (Signed)
We did discuss this at her visit.  Her blood work looks good. I will send in refills of the Prozac to her pharmacy. I can restart the Adderall but will not be back at [facility] until Wednesday. I have to print and sign this Rx. She can pick up the prescription as early as Wednesday morning.

## 2014-03-13 NOTE — Telephone Encounter (Signed)
Received message from pt requesting refill of prozac 20 & 40mg  and wants to restart adderall 10mg . Pt states she was on adderall years ago and talked with Provider at last visit re: both medications and was told that a decision would be made after pt completed labs. Pt wants to know if Rxs can be sent?  Please advise.

## 2014-03-13 NOTE — Telephone Encounter (Signed)
Notified pt and she voices understanding. States she will call back Wednesday morning to check on status of Adderall.

## 2014-03-15 MED ORDER — AMPHETAMINE-DEXTROAMPHETAMINE 10 MG PO TABS: 10.0000 mg | ORAL_TABLET | Freq: Every day | ORAL | Status: DC

## 2014-03-15 NOTE — Telephone Encounter (Signed)
Patient has p/u Rx at front desk per log in/out book/SLS

## 2014-03-15 NOTE — Telephone Encounter (Signed)
Rx printed, signed and placed a front desk for pick up.

## 2014-03-28 ENCOUNTER — Encounter: Payer: Self-pay | Admitting: Physician Assistant

## 2014-03-29 ENCOUNTER — Ambulatory Visit: Payer: 59 | Admitting: Physician Assistant

## 2014-04-12 ENCOUNTER — Ambulatory Visit: Payer: 59 | Admitting: Physician Assistant

## 2014-04-12 DIAGNOSIS — Z0289 Encounter for other administrative examinations: Secondary | ICD-10-CM

## 2014-04-19 ENCOUNTER — Ambulatory Visit: Payer: 59 | Admitting: Physician Assistant

## 2014-04-20 ENCOUNTER — Ambulatory Visit (INDEPENDENT_AMBULATORY_CARE_PROVIDER_SITE_OTHER): Payer: 59 | Admitting: Physician Assistant

## 2014-04-20 ENCOUNTER — Encounter: Payer: Self-pay | Admitting: Physician Assistant

## 2014-04-20 VITALS — BP 116/78 | HR 83 | Temp 98.3°F | Resp 16 | Ht 64.0 in | Wt 205.8 lb

## 2014-04-20 DIAGNOSIS — F988 Other specified behavioral and emotional disorders with onset usually occurring in childhood and adolescence: Secondary | ICD-10-CM

## 2014-04-20 DIAGNOSIS — F429 Obsessive-compulsive disorder, unspecified: Secondary | ICD-10-CM

## 2014-04-20 MED ORDER — AMPHETAMINE-DEXTROAMPHETAMINE 10 MG PO TABS: 10.0000 mg | ORAL_TABLET | Freq: Every day | ORAL | Status: DC

## 2014-04-20 MED ORDER — FLUOXETINE HCL 40 MG PO CAPS: 40.0000 mg | ORAL_CAPSULE | Freq: Every day | ORAL | Status: DC

## 2014-04-20 MED ORDER — AMPHETAMINE-DEXTROAMPHETAMINE 10 MG PO TABS
10.0000 mg | ORAL_TABLET | Freq: Every day | ORAL | Status: DC
Start: 2014-04-20 — End: 2014-04-20

## 2014-04-20 MED ORDER — FLUOXETINE HCL 20 MG PO CAPS: 20.0000 mg | ORAL_CAPSULE | Freq: Every day | ORAL | Status: DC

## 2014-04-20 NOTE — Progress Notes (Signed)
Pre visit review using our clinic review tool, if applicable. No additional management support is needed unless otherwise documented below in the visit note/SLS  

## 2014-04-20 NOTE — Patient Instructions (Signed)
Please continue medications as directed.  Follow-up in 3 months.  Return sooner if you need anything.  Keep up the diet and exercise.  Our goal is 150 minutes of cardio per week!  Exercise to Stay Healthy Exercise helps you become and stay healthy. EXERCISE IDEAS AND TIPS Choose exercises that:  You enjoy.  Fit into your day. You do not need to exercise really hard to be healthy. You can do exercises at a slow or medium level and stay healthy. You can:  Stretch before and after working out.  Try yoga, Pilates, or tai chi.  Lift weights.  Walk fast, swim, jog, run, climb stairs, bicycle, dance, or rollerskate.  Take aerobic classes. Exercises that burn about 150 calories:  Running 1  miles in 15 minutes.  Playing volleyball for 45 to 60 minutes.  Washing and waxing a car for 45 to 60 minutes.  Playing touch football for 45 minutes.  Walking 1  miles in 35 minutes.  Pushing a stroller 1  miles in 30 minutes.  Playing basketball for 30 minutes.  Raking leaves for 30 minutes.  Bicycling 5 miles in 30 minutes.  Walking 2 miles in 30 minutes.  Dancing for 30 minutes.  Shoveling snow for 15 minutes.  Swimming laps for 20 minutes.  Walking up stairs for 15 minutes.  Bicycling 4 miles in 15 minutes.  Gardening for 30 to 45 minutes.  Jumping rope for 15 minutes.  Washing windows or floors for 45 to 60 minutes. Document Released: 11/22/2010 Document Revised: 01/12/2012 Document Reviewed: 11/22/2010 [facility] Patient Information 2015 [place], [place]. This information is not intended to replace advice given to you by your health care provider. Make sure you discuss any questions you have with your health care provider.

## 2014-04-21 ENCOUNTER — Telehealth: Payer: Self-pay | Admitting: Physician Assistant

## 2014-04-21 NOTE — Telephone Encounter (Signed)
Relevant patient education assigned to patient using Emmi. ° °

## 2014-04-23 DIAGNOSIS — F419 Anxiety disorder, unspecified: Secondary | ICD-10-CM

## 2014-04-23 DIAGNOSIS — F32A Depression, unspecified: Secondary | ICD-10-CM | POA: Insufficient documentation

## 2014-04-23 DIAGNOSIS — F988 Other specified behavioral and emotional disorders with onset usually occurring in childhood and adolescence: Secondary | ICD-10-CM | POA: Insufficient documentation

## 2014-04-23 DIAGNOSIS — F329 Major depressive disorder, single episode, unspecified: Secondary | ICD-10-CM | POA: Insufficient documentation

## 2014-04-23 NOTE — Assessment & Plan Note (Signed)
Well controlled.  Continue current regimen.  Vital signs within normal limits.  Follow-up 6 months.

## 2014-04-23 NOTE — Progress Notes (Signed)
Patient presents to clinic today  For medication management.  Patient currently on daily Adderall for ADD and Prozac 60 mg for OCD symptoms.  Patient endorses doing well on medications.  Denies chest pain, palpitations, anorexia, insomnia.  Denies depressed mood with SSRI.  Past Medical History  Diagnosis Date  . GERD (gastroesophageal reflux disease)   . Seasonal allergies   . Environmental allergies   . Chicken pox   . Mumps   . Measles     Current Outpatient Prescriptions on File Prior to Visit  Medication Sig Dispense Refill  . cetirizine (ZYRTEC) 10 MG tablet Take 10 mg by mouth daily.      Marland Kitchen omeprazole (PRILOSEC) 20 MG capsule Take 1 capsule (20 mg total) by mouth daily.  30 capsule  3   No current facility-administered medications on file prior to visit.    Allergies  Allergen Reactions  . Penicillins Anaphylaxis    Family History  Problem Relation Age of Onset  . Healthy Mother     Living  . Healthy Father     Living  . Diabetes Maternal Grandfather     Borderline  . COPD Maternal Aunt   . COPD Maternal Uncle   . Alzheimer's disease Maternal Grandmother   . Alzheimer's disease Paternal Grandfather   . Healthy Brother     x1  . Allergies Son     x1    History   Social History  . Marital Status: Single    Spouse Name: N/A    Number of Children: N/A  . Years of Education: N/A   Social History Main Topics  . Smoking status: Current Every Day Smoker -- 1.00 packs/day for 30 years  . Smokeless tobacco: Never Used  . Alcohol Use: 6.0 oz/week    10 Shots of liquor per week  . Drug Use: No  . Sexual Activity: No   Other Topics Concern  . None   Social History Narrative  . None    Review of Systems - See HPI.  All other ROS are negative.  BP 116/78  Pulse 83  Temp(Src) 98.3 F (36.8 C) (Oral)  Resp 16  Ht $R'5\' 4"'PU$  (1.626 m)  Wt 205 lb 12 oz (93.328 kg)  BMI 35.30 kg/m2  SpO2 97%  Physical Exam  Vitals reviewed. Constitutional: She is oriented  to person, place, and time and well-developed, well-nourished, and in no distress.  HENT:  Head: Normocephalic and atraumatic.  Eyes: Conjunctivae are normal. Pupils are equal, round, and reactive to light.  Neck: Neck supple. No thyromegaly present.  Cardiovascular: Normal rate, regular rhythm, normal heart sounds and intact distal pulses.   Pulmonary/Chest: Effort normal and breath sounds normal. No respiratory distress. She has no wheezes. She has no rales. She exhibits no tenderness.  Neurological: She is alert and oriented to person, place, and time.  Skin: Skin is warm and dry. No rash noted.  Psychiatric: Affect normal.    Recent Results (from the past 2160 hour(s))  CBC WITH DIFFERENTIAL     Status: None   Collection Time    02/23/14  9:34 AM      Result Value Ref Range   WBC 9.2  4.0 - 10.5 K/uL   RBC 4.47  3.87 - 5.11 MIL/uL   Hemoglobin 12.8  12.0 - 15.0 g/dL   HCT 39.1  36.0 - 46.0 %   MCV 87.5  78.0 - 100.0 fL   MCH 28.6  26.0 - 34.0 pg  MCHC 32.7  30.0 - 36.0 g/dL   RDW 14.3  11.5 - 15.5 %   Platelets 189  150 - 400 K/uL   Neutrophils Relative % 60  43 - 77 %   Neutro Abs 5.5  1.7 - 7.7 K/uL   Lymphocytes Relative 31  12 - 46 %   Lymphs Abs 2.9  0.7 - 4.0 K/uL   Monocytes Relative 7  3 - 12 %   Monocytes Absolute 0.6  0.1 - 1.0 K/uL   Eosinophils Relative 1  0 - 5 %   Eosinophils Absolute 0.1  0.0 - 0.7 K/uL   Basophils Relative 1  0 - 1 %   Basophils Absolute 0.1  0.0 - 0.1 K/uL   Smear Review Criteria for review not met    BASIC METABOLIC PANEL     Status: None   Collection Time    02/23/14  9:34 AM      Result Value Ref Range   Sodium 139  135 - 145 mEq/L   Potassium 4.5  3.5 - 5.3 mEq/L   Chloride 103  96 - 112 mEq/L   CO2 28  19 - 32 mEq/L   Glucose, Bld 89  70 - 99 mg/dL   BUN 11  6 - 23 mg/dL   Creat 0.65  0.50 - 1.10 mg/dL   Calcium 8.9  8.4 - 10.5 mg/dL  HEPATIC FUNCTION PANEL     Status: None   Collection Time    02/23/14  9:34 AM       Result Value Ref Range   Total Bilirubin 0.2  0.2 - 1.2 mg/dL   Bilirubin, Direct <0.1  0.0 - 0.3 mg/dL   Indirect Bilirubin NOT CALC  0.2 - 1.2 mg/dL   Alkaline Phosphatase 78  39 - 117 U/L   AST 25  0 - 37 U/L   ALT 21  0 - 35 U/L   Total Protein 6.7  6.0 - 8.3 g/dL   Albumin 4.1  3.5 - 5.2 g/dL  TSH     Status: None   Collection Time    02/23/14  9:34 AM      Result Value Ref Range   TSH 0.698  0.350 - 4.500 uIU/mL  HEMOGLOBIN A1C     Status: None   Collection Time    02/23/14  9:34 AM      Result Value Ref Range   Hemoglobin A1C 5.5  <5.7 %   Comment:                                                                            According to the ADA Clinical Practice Recommendations for 2011, when     HbA1c is used as a screening test:             >=6.5%   Diagnostic of Diabetes Mellitus                (if abnormal result is confirmed)           5.7-6.4%   Increased risk of developing Diabetes Mellitus           References:Diagnosis and Classification of Diabetes Mellitus,Diabetes  MCNO,7096,28(ZMOQH 1):S62-S69 and Standards of Medical Care in             Diabetes - 2011,Diabetes UTML,4650,35 (Suppl 1):S11-S61.         Mean Plasma Glucose 111  <117 mg/dL  URINALYSIS, ROUTINE W REFLEX MICROSCOPIC     Status: None   Collection Time    02/23/14  9:34 AM      Result Value Ref Range   Color, Urine YELLOW  YELLOW   APPearance CLEAR  CLEAR   Specific Gravity, Urine 1.027  1.005 - 1.030   pH 7.0  5.0 - 8.0   Glucose, UA NEG  NEG mg/dL   Bilirubin Urine NEG  NEG   Ketones, ur NEG  NEG mg/dL   Hgb urine dipstick NEG  NEG   Protein, ur NEG  NEG mg/dL   Urobilinogen, UA 0.2  0.0 - 1.0 mg/dL   Nitrite NEG  NEG   Leukocytes, UA NEG  NEG  LIPID PANEL     Status: Abnormal   Collection Time    02/23/14  9:34 AM      Result Value Ref Range   Cholesterol 246 (*) 0 - 200 mg/dL   Comment: ATP III Classification:           < 200        mg/dL        Desirable          200 - 239      mg/dL        Borderline High          >= 240        mg/dL        High         Triglycerides 209 (*) <150 mg/dL   HDL 53  >39 mg/dL   Total CHOL/HDL Ratio 4.6     VLDL 42 (*) 0 - 40 mg/dL   LDL Cholesterol 151 (*) 0 - 99 mg/dL   Comment:       Total Cholesterol/HDL Ratio:CHD Risk                            Coronary Heart Disease Risk Table                                            Men       Women              1/2 Average Risk              3.4        3.3                  Average Risk              5.0        4.4               2X Average Risk              9.6        7.1               3X Average Risk             23.4       11.0     Use the calculated Patient Ratio above  and the CHD Risk table      to determine the patient's CHD Risk.     ATP III Classification (LDL):           < 100        mg/dL         Optimal          100 - 129     mg/dL         Near or Above Optimal          130 - 159     mg/dL         Borderline High          160 - 189     mg/dL         High           > 190        mg/dL         Very High          Assessment/Plan: OCD (obsessive compulsive disorder) Well-controlled.  Continue current regimen.  Medications refilled.  Follow-up 6 months.  ADD (attention deficit disorder) Well controlled.  Continue current regimen.  Vital signs within normal limits.  Follow-up 6 months.

## 2014-04-23 NOTE — Assessment & Plan Note (Signed)
Well-controlled.  Continue current regimen.  Medications refilled.  Follow-up 6 months.

## 2014-05-29 ENCOUNTER — Telehealth: Payer: Self-pay | Admitting: Physician Assistant

## 2014-05-29 NOTE — Telephone Encounter (Signed)
Last Rx sent to pharmacy 04.23.15, #[id number]; verified this with pharmacist, not due until 08.23.15, too early for request/SLS

## 2014-05-29 NOTE — Telephone Encounter (Signed)
Requesting refill on Omeprazole 20mg  take 1 cap by mouth daily, please call into [facility] [phone] Fax [phone]

## 2014-07-21 ENCOUNTER — Ambulatory Visit: Payer: 59 | Admitting: Physician Assistant

## 2014-08-03 ENCOUNTER — Telehealth: Payer: Self-pay | Admitting: Physician Assistant

## 2014-08-03 DIAGNOSIS — F988 Other specified behavioral and emotional disorders with onset usually occurring in childhood and adolescence: Secondary | ICD-10-CM

## 2014-08-03 NOTE — Telephone Encounter (Signed)
Caller name: Dianca Relation to pt: Call back number:[phone]   Reason for call:  Pt has apt scheduled for 10/1, but is out of Rx amphetamine-dextroamphetamine (ADDERALL) 10 MG tablet and is requesting a refill.  Thanks.

## 2014-08-04 MED ORDER — AMPHETAMINE-DEXTROAMPHETAMINE 10 MG PO TABS: 10.0000 mg | ORAL_TABLET | Freq: Every day | ORAL | Status: DC

## 2014-08-04 NOTE — Telephone Encounter (Signed)
Patient has appointment on 10.19.15 w/[facility], would you be willing to sign a #30 Rx on her Adderall [Last Rx was fill on or after 08.17.15]/SLS Please Advise.

## 2014-08-04 NOTE — Telephone Encounter (Signed)
Patient informed, understood Rx is ready for pick-up during regular business hours/SLS

## 2014-08-04 NOTE — Telephone Encounter (Signed)
OK to print Adderall 30 day supply

## 2014-08-15 ENCOUNTER — Ambulatory Visit (HOSPITAL_BASED_OUTPATIENT_CLINIC_OR_DEPARTMENT_OTHER)
Admission: RE | Admit: 2014-08-15 | Discharge: 2014-08-15 | Disposition: A | Discharge status: Home / Self Care | Payer: 59 | Source: Ambulatory Visit | Attending: Physician Assistant | Admitting: Physician Assistant

## 2014-08-15 DIAGNOSIS — Z1231 Encounter for screening mammogram for malignant neoplasm of breast: Secondary | ICD-10-CM | POA: Insufficient documentation

## 2014-08-15 DIAGNOSIS — Z1239 Encounter for other screening for malignant neoplasm of breast: Secondary | ICD-10-CM

## 2014-08-17 ENCOUNTER — Other Ambulatory Visit: Payer: Self-pay | Admitting: Physician Assistant

## 2014-08-17 DIAGNOSIS — R928 Other abnormal and inconclusive findings on diagnostic imaging of breast: Secondary | ICD-10-CM

## 2014-08-21 ENCOUNTER — Ambulatory Visit: Payer: 59 | Admitting: Physician Assistant

## 2014-08-21 DIAGNOSIS — Z0289 Encounter for other administrative examinations: Secondary | ICD-10-CM

## 2014-08-29 ENCOUNTER — Ambulatory Visit (INDEPENDENT_AMBULATORY_CARE_PROVIDER_SITE_OTHER): Payer: 59 | Admitting: Physician Assistant

## 2014-08-29 ENCOUNTER — Encounter: Payer: Self-pay | Admitting: Physician Assistant

## 2014-08-29 VITALS — BP 138/109 | HR 77 | Temp 98.4°F | Resp 16 | Ht 64.0 in | Wt 195.4 lb

## 2014-08-29 DIAGNOSIS — J0100 Acute maxillary sinusitis, unspecified: Secondary | ICD-10-CM

## 2014-08-29 DIAGNOSIS — F988 Other specified behavioral and emotional disorders with onset usually occurring in childhood and adolescence: Secondary | ICD-10-CM

## 2014-08-29 DIAGNOSIS — F42 Obsessive-compulsive disorder: Secondary | ICD-10-CM

## 2014-08-29 DIAGNOSIS — F909 Attention-deficit hyperactivity disorder, unspecified type: Secondary | ICD-10-CM

## 2014-08-29 DIAGNOSIS — F429 Obsessive-compulsive disorder, unspecified: Secondary | ICD-10-CM

## 2014-08-29 MED ORDER — FLUOXETINE HCL 60 MG PO TABS: 1.0000 | ORAL_TABLET | Freq: Every day | ORAL | Status: DC

## 2014-08-29 MED ORDER — AMPHETAMINE-DEXTROAMPHETAMINE 10 MG PO TABS: 10.0000 mg | ORAL_TABLET | Freq: Every day | ORAL | Status: DC

## 2014-08-29 MED ORDER — AZITHROMYCIN 250 MG PO TABS: ORAL_TABLET | ORAL | Status: DC

## 2014-08-29 NOTE — Assessment & Plan Note (Signed)
Doing well.  Denies ADR.  Will continue current regimen.  Medications refilled.  Follow-up in 6 months.

## 2014-08-29 NOTE — Assessment & Plan Note (Signed)
Doing very well.  Continue current regimen.  Follow-up in 6 months.

## 2014-08-29 NOTE — Patient Instructions (Signed)
Please take antibiotic as directed.  Increase fluid intake.  Use Saline nasal spray.  Take a daily multivitamin. Delsym for cough.  Place a humidifier in the bedroom.  Please call or return clinic if symptoms are not improving.  Please continue other medications as directed.  Follow-up in 6 months.  Sinusitis Sinusitis is redness, soreness, and swelling (inflammation) of the paranasal sinuses. Paranasal sinuses are air pockets within the bones of your face (beneath the eyes, the middle of the forehead, or above the eyes). In healthy paranasal sinuses, mucus is able to drain out, and air is able to circulate through them by way of your nose. However, when your paranasal sinuses are inflamed, mucus and air can become trapped. This can allow bacteria and other germs to grow and cause infection. Sinusitis can develop quickly and last only a short time (acute) or continue over a long period (chronic). Sinusitis that lasts for more than 12 weeks is considered chronic.  CAUSES  Causes of sinusitis include:  Allergies.  Structural abnormalities, such as displacement of the cartilage that separates your nostrils (deviated septum), which can decrease the air flow through your nose and sinuses and affect sinus drainage.  Functional abnormalities, such as when the small hairs (cilia) that line your sinuses and help remove mucus do not work properly or are not present. SYMPTOMS  Symptoms of acute and chronic sinusitis are the same. The primary symptoms are pain and pressure around the affected sinuses. Other symptoms include:  Upper toothache.  Earache.  Headache.  Bad breath.  Decreased sense of smell and taste.  A cough, which worsens when you are lying flat.  Fatigue.  Fever.  Thick drainage from your nose, which often is green and may contain pus (purulent).  Swelling and warmth over the affected sinuses. DIAGNOSIS  Your caregiver will perform a physical exam. During the exam, your  caregiver may:  Look in your nose for signs of abnormal growths in your nostrils (nasal polyps).  Tap over the affected sinus to check for signs of infection.  View the inside of your sinuses (endoscopy) with a special imaging device with a light attached (endoscope), which is inserted into your sinuses. If your caregiver suspects that you have chronic sinusitis, one or more of the following tests may be recommended:  Allergy tests.  Nasal culture A sample of mucus is taken from your nose and sent to a lab and screened for bacteria.  Nasal cytology A sample of mucus is taken from your nose and examined by your caregiver to determine if your sinusitis is related to an allergy. TREATMENT  Most cases of acute sinusitis are related to a viral infection and will resolve on their own within 10 days. Sometimes medicines are prescribed to help relieve symptoms (pain medicine, decongestants, nasal steroid sprays, or saline sprays).  However, for sinusitis related to a bacterial infection, your caregiver will prescribe antibiotic medicines. These are medicines that will help kill the bacteria causing the infection.  Rarely, sinusitis is caused by a fungal infection. In theses cases, your caregiver will prescribe antifungal medicine. For some cases of chronic sinusitis, surgery is needed. Generally, these are cases in which sinusitis recurs more than 3 times per year, despite other treatments. HOME CARE INSTRUCTIONS   Drink plenty of water. Water helps thin the mucus so your sinuses can drain more easily.  Use a humidifier.  Inhale steam 3 to 4 times a day (for example, sit in the bathroom with the shower running).  Apply a warm, moist washcloth to your face 3 to 4 times a day, or as directed by your caregiver.  Use saline nasal sprays to help moisten and clean your sinuses.  Take over-the-counter or prescription medicines for pain, discomfort, or fever only as directed by your caregiver. SEEK  IMMEDIATE MEDICAL CARE IF:  You have increasing pain or severe headaches.  You have nausea, vomiting, or drowsiness.  You have swelling around your face.  You have vision problems.  You have a stiff neck.  You have difficulty breathing. MAKE SURE YOU:   Understand these instructions.  Will watch your condition.  Will get help right away if you are not doing well or get worse. Document Released: 10/20/2005 Document Revised: 01/12/2012 Document Reviewed: 11/04/2011 [facility] Patient Information 2014 [place], [place].

## 2014-08-29 NOTE — Assessment & Plan Note (Signed)
Rx Azithromycin.  Increase fluids.  Rest.  Saline nasal spray.  Delsym.  Humidifier in bedroom. Return precautions discussed with patient.

## 2014-08-29 NOTE — Progress Notes (Signed)
Pre visit review using our clinic review tool, if applicable. No additional management support is needed unless otherwise documented below in the visit note/SLS  

## 2014-08-29 NOTE — Progress Notes (Signed)
Patient presents to clinic today for follow-up of ADD.  Patient currently on Adderall 10 mg daily and Prozac 60 mg daily.  Endorses taking medication as directed.  Denies chest pain, palpitations, insomnia or anorexia.  Denies depressed mood, SI/HI.  Patient with recent abnormal screening mammogram showing mass/increased density of L breast.  Has scheduled diagnostic mammogram and [place] of L Breast.  Patient c/o sinus pressure, sinus pain, tooth pain and left ear pain over the past 1.5 weeks.  Denies cough or shortness of breath.  Endorses rhinorrhea. Unsure of fever. Denies recent travel or sick contact.  Past Medical History  Diagnosis Date  . GERD (gastroesophageal reflux disease)   . Seasonal allergies   . Environmental allergies   . Chicken pox   . Mumps   . Measles     Current Outpatient Prescriptions on File Prior to Visit  Medication Sig Dispense Refill  . cetirizine (ZYRTEC) 10 MG tablet Take 10 mg by mouth daily.      Marland Kitchen omeprazole (PRILOSEC) 20 MG capsule Take 1 capsule (20 mg total) by mouth daily.  30 capsule  3   No current facility-administered medications on file prior to visit.    Allergies  Allergen Reactions  . Penicillins Anaphylaxis    Family History  Problem Relation Age of Onset  . Healthy Mother     Living  . Healthy Father     Living  . Diabetes Maternal Grandfather     Borderline  . COPD Maternal Aunt   . COPD Maternal Uncle   . Alzheimer's disease Maternal Grandmother   . Alzheimer's disease Paternal Grandfather   . Healthy Brother     x1  . Allergies Son     x1    History   Social History  . Marital Status: Single    Spouse Name: N/A    Number of Children: N/A  . Years of Education: N/A   Social History Main Topics  . Smoking status: Current Every Day Smoker -- 1.00 packs/day for 30 years  . Smokeless tobacco: Never Used  . Alcohol Use: 6.0 oz/week    10 Shots of liquor per week  . Drug Use: No  . Sexual Activity: No   Other  Topics Concern  . None   Social History Narrative  . None   Review of Systems - See HPI.  All other ROS are negative.  BP 138/109  Pulse 77  Temp(Src) 98.4 F (36.9 C) (Oral)  Resp 16  Ht 5\' 4"  (1.626 m)  Wt 195 lb 6 oz (88.622 kg)  BMI 33.52 kg/m2  SpO2 100%  Physical Exam  Constitutional: She is oriented to person, place, and time and well-developed, well-nourished, and in no distress.  HENT:  Head: Normocephalic and atraumatic.  Right Ear: External ear normal.  Left Ear: External ear normal.  Nose: Right sinus exhibits maxillary sinus tenderness. Left sinus exhibits maxillary sinus tenderness.  Mouth/Throat: Uvula is midline, oropharynx is clear and moist and mucous membranes are normal. No oropharyngeal exudate.  Eyes: Conjunctivae are normal. Pupils are equal, round, and reactive to light.  Neck: Neck supple.  Cardiovascular: Normal rate, regular rhythm, normal heart sounds and intact distal pulses.   Pulmonary/Chest: Effort normal and breath sounds normal. No respiratory distress. She has no wheezes. She has no rales. She exhibits no tenderness.  Lymphadenopathy:    She has no cervical adenopathy.  Neurological: She is alert and oriented to person, place, and time.  Skin: Skin  is warm and dry. No rash noted.  Psychiatric: Affect normal.   Assessment/Plan: OCD (obsessive compulsive disorder) Doing very well.  Continue current regimen.  Follow-up in 6 months.  ADD (attention deficit disorder) Doing well.  Denies ADR.  Will continue current regimen.  Medications refilled.  Follow-up in 6 months.  Sinusitis Rx Azithromycin.  Increase fluids.  Rest.  Saline nasal spray.  Delsym.  Humidifier in bedroom. Return precautions discussed with patient.

## 2014-09-07 ENCOUNTER — Ambulatory Visit
Admission: RE | Admit: 2014-09-07 | Discharge: 2014-09-07 | Disposition: A | Discharge status: Home / Self Care | Payer: 59 | Source: Ambulatory Visit | Attending: Physician Assistant | Admitting: Physician Assistant

## 2014-09-07 DIAGNOSIS — R928 Other abnormal and inconclusive findings on diagnostic imaging of breast: Secondary | ICD-10-CM

## 2014-09-27 ENCOUNTER — Encounter: Payer: Self-pay | Admitting: Physician Assistant

## 2014-11-30 ENCOUNTER — Encounter: Payer: Self-pay | Admitting: Medical

## 2014-11-30 ENCOUNTER — Ambulatory Visit (INDEPENDENT_AMBULATORY_CARE_PROVIDER_SITE_OTHER): Payer: 59 | Admitting: Medical

## 2014-11-30 VITALS — BP 152/92 | HR 82 | Temp 98.2°F | Wt 197.2 lb

## 2014-11-30 DIAGNOSIS — T7840XA Allergy, unspecified, initial encounter: Secondary | ICD-10-CM

## 2014-11-30 MED ORDER — AMMONIUM LACTATE 12 % EX CREA: TOPICAL_CREAM | CUTANEOUS | Status: DC | PRN

## 2014-11-30 MED ORDER — HYDROXYZINE HCL 25 MG PO TABS: 25.0000 mg | ORAL_TABLET | Freq: Three times a day (TID) | ORAL | Status: DC | PRN

## 2014-11-30 MED ORDER — PREDNISONE 20 MG PO TABS: ORAL_TABLET | ORAL | Status: DC

## 2014-11-30 NOTE — Assessment & Plan Note (Signed)
Your have baseline psoriasis with new rash area in different regions.  I will treat you for allergic reaction. This treatment may help you psoriasis for short term as well.  I am prescribing prednisone, hydroxyzine, and lac-hydrin rx.

## 2014-11-30 NOTE — Progress Notes (Signed)
Subjective:    Patient ID: Lauren Case, female    DOB: Oct 25, 1966, 49 y.o.   MRN: [id number]  HPI   Pt has recent rash that itches both arms. Reports rash occurred after no known particular exposure. On review pt does not report any suspicious exposure to soaps, creams, detergents, make up, lotions, detergents, animal exposure exposure, plants or insect bites.  Pt rash is in the area of elbows and forearms. Pt reports no shortness of breath or wheezing. . Pt is not diabetic.  This occurred about one month ago.  Baseline large patches of lichinfication. She has psoriasis. So faint area on her forearms showing  Rash on anterior aspect.    Past Medical History  Diagnosis Date  . GERD (gastroesophageal reflux disease)   . Seasonal allergies   . Environmental allergies   . Chicken pox   . Mumps   . Measles     History   Social History  . Marital Status: Single    Spouse Name: N/A    Number of Children: N/A  . Years of Education: N/A   Occupational History  . Not on file.   Social History Main Topics  . Smoking status: Current Every Day Smoker -- 1.00 packs/day for 30 years  . Smokeless tobacco: Never Used  . Alcohol Use: 6.0 oz/week    10 Shots of liquor per week  . Drug Use: No  . Sexual Activity: No   Other Topics Concern  . Not on file   Social History Narrative    Past Surgical History  Procedure Laterality Date  . Tonsillectomy  1984  . Wisdom tooth extraction    . Essure tubal ligation      Family History  Problem Relation Age of Onset  . Healthy Mother     Living  . Healthy Father     Living  . Diabetes Maternal Grandfather     Borderline  . COPD Maternal Aunt   . COPD Maternal Uncle   . Alzheimer's disease Maternal Grandmother   . Alzheimer's disease Paternal Grandfather   . Healthy Brother     x1  . Allergies Son     x1    Allergies  Allergen Reactions  . Penicillins Anaphylaxis    Current Outpatient Prescriptions on File  Prior to Visit  Medication Sig Dispense Refill  . amphetamine-dextroamphetamine (ADDERALL) 10 MG tablet Take 1 tablet (10 mg total) by mouth daily with breakfast. 30 tablet 0  . cetirizine (ZYRTEC) 10 MG tablet Take 10 mg by mouth daily.    Marland Kitchen FLUoxetine HCl 60 MG TABS Take 1 tablet by mouth daily. 90 tablet 1  . omeprazole (PRILOSEC) 20 MG capsule Take 1 capsule (20 mg total) by mouth daily. 30 capsule 3   No current facility-administered medications on file prior to visit.    BP 152/92 mmHg  Pulse 82  Temp(Src) 98.2 F (36.8 C) (Oral)  Wt 197 lb 3.2 oz (89.449 kg)  SpO2 99%     Review of Systems  Constitutional: Negative for fever, chills and fatigue.  Respiratory: Negative for cough, choking, chest tightness, shortness of breath and wheezing.   Cardiovascular: Negative for chest pain and palpitations.  Skin: Positive for rash.  Hematological: Negative for adenopathy. Does not bruise/bleed easily.       Objective:   Physical Exam   General- No acute distress. Pleasant patient. Neck- Full range of motion, no jvd Lungs- Clear, even and unlabored. Heart- regular rate and rhythm.  Neurologic- CNII- XII grossly intact.  Skin- lichinfied appearance elbows and over mcp regions on her hands.(new faint red areas on her forearms. That spread up her arm.         Assessment & Plan:

## 2014-11-30 NOTE — Patient Instructions (Addendum)
Your have baseline psoriasis with new rash area in different regions.  I will treat you for allergic reaction. This treatment may help you psoriasis for short term as well.  I am prescribing prednisone, hydroxyzine, and lac-hydrin rx.  I would consider adding doxycycline in 5 days if follicles look inflamed.  Follow up in 7-10 days or as needed.

## 2014-11-30 NOTE — Progress Notes (Signed)
Pre visit review using our clinic review tool, if applicable. No additional management support is needed unless otherwise documented below in the visit note. 

## 2014-12-07 ENCOUNTER — Encounter: Payer: Self-pay | Admitting: Medical

## 2014-12-07 ENCOUNTER — Telehealth: Payer: Self-pay | Admitting: Medical

## 2014-12-07 NOTE — Telephone Encounter (Signed)
Pt needs to be seen for various new symptoms.

## 2014-12-07 NOTE — Telephone Encounter (Signed)
Patient scheduled for tomorrow 12/08/14.

## 2014-12-08 ENCOUNTER — Encounter: Payer: Self-pay | Admitting: Medical

## 2014-12-08 ENCOUNTER — Ambulatory Visit (INDEPENDENT_AMBULATORY_CARE_PROVIDER_SITE_OTHER): Payer: 59 | Admitting: Medical

## 2014-12-08 ENCOUNTER — Encounter: Payer: Self-pay | Admitting: Physician Assistant

## 2014-12-08 VITALS — BP 150/90 | HR 84 | Temp 98.3°F | Ht 64.0 in | Wt 197.0 lb

## 2014-12-08 DIAGNOSIS — R030 Elevated blood-pressure reading, without diagnosis of hypertension: Secondary | ICD-10-CM

## 2014-12-08 DIAGNOSIS — IMO0001 Reserved for inherently not codable concepts without codable children: Secondary | ICD-10-CM

## 2014-12-08 DIAGNOSIS — L409 Psoriasis, unspecified: Secondary | ICD-10-CM

## 2014-12-08 DIAGNOSIS — B370 Candidal stomatitis: Secondary | ICD-10-CM

## 2014-12-08 MED ORDER — FLUCONAZOLE 150 MG PO TABS: ORAL_TABLET | ORAL | Status: DC

## 2014-12-08 MED ORDER — MAGIC MOUTHWASH: ORAL | Status: DC

## 2014-12-08 NOTE — Patient Instructions (Signed)
Thrush Possible of mouth and esophagus post prednisone use. Rx diflucan and magic mouthwash.   Elevated BP Dash diet, mild exercise, check bp daily(otc cuff), and bring log book of readings in 3 weeks on followup.   Psoriasis (a type of skin inflammation) Continue moisturizer but refer to dermatologist.    Robbins stands for "Dietary Approaches to Stop Hypertension." The DASH eating plan is a healthy eating plan that has been shown to reduce high blood pressure (hypertension). Additional health benefits may include reducing the risk of type 2 diabetes mellitus, heart disease, and stroke. The DASH eating plan may also help with weight loss. WHAT DO I NEED TO KNOW ABOUT THE DASH EATING PLAN? For the DASH eating plan, you will follow these general guidelines:  Choose foods with a percent daily value for sodium of less than 5% (as listed on the food label).  Use salt-free seasonings or herbs instead of table salt or sea salt.  Check with your health care provider or pharmacist before using salt substitutes.  Eat lower-sodium products, often labeled as "lower sodium" or "no salt added."  Eat fresh foods.  Eat more vegetables, fruits, and low-fat dairy products.  Choose whole grains. Look for the word "whole" as the first word in the ingredient list.  Choose fish and skinless chicken or [place] more often than red meat. Limit fish, poultry, and meat to 6 oz (170 g) each day.  Limit sweets, desserts, sugars, and sugary drinks.  Choose heart-healthy fats.  Limit cheese to 1 oz (28 g) per day.  Eat more home-cooked food and less restaurant, buffet, and fast food.  Limit fried foods.  Cook foods using methods other than frying.  Limit canned vegetables. If you do use them, rinse them well to decrease the sodium.  When eating at a restaurant, ask that your food be prepared with less salt, or no salt if possible. WHAT FOODS CAN I EAT? Seek help from a dietitian for  individual calorie needs. Grains Whole grain or whole wheat bread. Brown rice. Whole grain or whole wheat pasta. Quinoa, bulgur, and whole grain cereals. Low-sodium cereals. Corn or whole wheat flour tortillas. Whole grain cornbread. Whole grain crackers. Low-sodium crackers. Vegetables Fresh or frozen vegetables (raw, steamed, roasted, or grilled). Low-sodium or reduced-sodium tomato and vegetable juices. Low-sodium or reduced-sodium tomato sauce and paste. Low-sodium or reduced-sodium canned vegetables.  Fruits All fresh, canned (in natural juice), or frozen fruits. Meat and Other Protein Products Ground beef (85% or leaner), grass-fed beef, or beef trimmed of fat. Skinless chicken or [place]. Ground chicken or [place]. Pork trimmed of fat. All fish and seafood. Eggs. Dried beans, peas, or lentils. Unsalted nuts and seeds. Unsalted canned beans. Dairy Low-fat dairy products, such as skim or 1% milk, 2% or reduced-fat cheeses, low-fat ricotta or cottage cheese, or plain low-fat yogurt. Low-sodium or reduced-sodium cheeses. Fats and Oils Tub margarines without trans fats. Light or reduced-fat mayonnaise and salad dressings (reduced sodium). Avocado. Safflower, olive, or canola oils. Natural peanut or almond butter. Other Unsalted popcorn and pretzels. The items listed above may not be a complete list of recommended foods or beverages. Contact your dietitian for more options. WHAT FOODS ARE NOT RECOMMENDED? Grains White bread. White pasta. White rice. Refined cornbread. Bagels and croissants. Crackers that contain trans fat. Vegetables Creamed or fried vegetables. Vegetables in a cheese sauce. Regular canned vegetables. Regular canned tomato sauce and paste. Regular tomato and vegetable juices. Fruits Dried fruits. Canned fruit in  light or heavy syrup. Fruit juice. Meat and Other Protein Products Fatty cuts of meat. Ribs, chicken wings, bacon, sausage, bologna, salami, chitterlings, fatback, hot  dogs, bratwurst, and packaged luncheon meats. Salted nuts and seeds. Canned beans with salt. Dairy Whole or 2% milk, cream, half-and-half, and cream cheese. Whole-fat or sweetened yogurt. Full-fat cheeses or blue cheese. Nondairy creamers and whipped toppings. Processed cheese, cheese spreads, or cheese curds. Condiments Onion and garlic salt, seasoned salt, table salt, and sea salt. Canned and packaged gravies. Worcestershire sauce. Tartar sauce. Barbecue sauce. Teriyaki sauce. Soy sauce, including reduced sodium. Steak sauce. Fish sauce. Oyster sauce. Cocktail sauce. Horseradish. Ketchup and mustard. Meat flavorings and tenderizers. Bouillon cubes. Hot sauce. Tabasco sauce. Marinades. Taco seasonings. Relishes. Fats and Oils Butter, stick margarine, lard, shortening, ghee, and bacon fat. Coconut, palm kernel, or palm oils. Regular salad dressings. Other Pickles and olives. Salted popcorn and pretzels. The items listed above may not be a complete list of foods and beverages to avoid. Contact your dietitian for more information. WHERE CAN I FIND MORE INFORMATION? National Heart, Lung, and Blood Institute: travelstabloid.com Document Released: 10/09/2011 Document Revised: 03/06/2014 Document Reviewed: 08/24/2013 [facility] Patient Information 2015 [place], [place]. This information is not intended to replace advice given to you by your health care provider. Make sure you discuss any questions you have with your health care provider.

## 2014-12-08 NOTE — Progress Notes (Signed)
Pre visit review using our clinic review tool, if applicable. No additional management support is needed unless otherwise documented below in the visit note. 

## 2014-12-08 NOTE — Assessment & Plan Note (Signed)
Dash diet, mild exercise, check bp daily(otc cuff), and bring log book of readings in 3 weeks on followup.

## 2014-12-08 NOTE — Assessment & Plan Note (Signed)
Continue moisturizer but refer to dermatologist.

## 2014-12-08 NOTE — Assessment & Plan Note (Signed)
Possible of mouth and esophagus post prednisone use. Rx diflucan and magic mouthwash.

## 2014-12-08 NOTE — Progress Notes (Signed)
Subjective:     Patient ID: Lauren Case, female   DOB: 02-06-1966, 49 y.o.   MRN: [id number]  HPI  Pt states her skin rash go better with use of the prednisone. Pt states mild face flushed feeling. This helped her psoriasis. Hand rash sites and elbows cleared up.  Pt at end of the prednisone course tongue felt little with whitish color change and sore tongue. Also when she swallows it hurts.   Arms are itching again and rash is reoccuring.  No sob or wheezing. No hx of angiodema. Only allergic reaction to pcn.       Review of Systems  Constitutional: Negative for fever, chills, diaphoresis, activity change and fatigue.  Respiratory: Negative for cough, chest tightness and shortness of breath.   Cardiovascular: Negative for chest pain, palpitations and leg swelling.  Gastrointestinal: Negative for nausea, vomiting and abdominal pain.  Musculoskeletal: Negative for neck pain and neck stiffness.  Skin: Positive for rash.       Rash on hands and elbows better but whitish film to tongue.  Neurological: Negative for dizziness, tremors, seizures, syncope, facial asymmetry, speech difficulty, weakness, light-headedness, numbness and headaches.  Psychiatric/Behavioral: Negative for behavioral problems, confusion and agitation. The patient is not nervous/anxious.    Past Medical History  Diagnosis Date  . GERD (gastroesophageal reflux disease)   . Seasonal allergies   . Environmental allergies   . Chicken pox   . Mumps   . Measles     History   Social History  . Marital Status: Single    Spouse Name: N/A    Number of Children: N/A  . Years of Education: N/A   Occupational History  . Not on file.   Social History Main Topics  . Smoking status: Current Every Day Smoker -- 1.00 packs/day for 30 years  . Smokeless tobacco: Never Used  . Alcohol Use: 6.0 oz/week    10 Shots of liquor per week  . Drug Use: No  . Sexual Activity: No   Other Topics Concern  . Not on file    Social History Narrative    Past Surgical History  Procedure Laterality Date  . Tonsillectomy  1984  . Wisdom tooth extraction    . Essure tubal ligation      Family History  Problem Relation Age of Onset  . Healthy Mother     Living  . Healthy Father     Living  . Diabetes Maternal Grandfather     Borderline  . COPD Maternal Aunt   . COPD Maternal Uncle   . Alzheimer's disease Maternal Grandmother   . Alzheimer's disease Paternal Grandfather   . Healthy Brother     x1  . Allergies Son     x1    Allergies  Allergen Reactions  . Penicillins Anaphylaxis    Current Outpatient Prescriptions on File Prior to Visit  Medication Sig Dispense Refill  . ammonium lactate (LAC-HYDRIN) 12 % cream Apply topically as needed for dry skin. 140 g 1  . amphetamine-dextroamphetamine (ADDERALL) 10 MG tablet Take 1 tablet (10 mg total) by mouth daily with breakfast. 30 tablet 0  . cetirizine (ZYRTEC) 10 MG tablet Take 10 mg by mouth daily.    Marland Kitchen FLUoxetine HCl 60 MG TABS Take 1 tablet by mouth daily. 90 tablet 1  . hydrOXYzine (ATARAX/VISTARIL) 25 MG tablet Take 1 tablet (25 mg total) by mouth every 8 (eight) hours as needed for itching. 21 tablet 0  . omeprazole (PRILOSEC)  20 MG capsule Take 1 capsule (20 mg total) by mouth daily. 30 capsule 3   No current facility-administered medications on file prior to visit.    BP 160/97 mmHg  Pulse 84  Temp(Src) 98.3 F (36.8 C) (Oral)  Ht 5\' 4"  (1.626 m)  Wt 197 lb (89.359 kg)  BMI 33.80 kg/m2  SpO2 95%     Objective:   Physical Exam  General Mental Status- Alert. General Appearance- Not in acute distress.   Skin General: Color- Normal Color. Moisture- Normal Moisture.  Neck Carotid Arteries- Normal color. Moisture- Normal Moisture. No carotid bruits. No JVD.  Chest and Lung Exam Auscultation: Breath Sounds:-Normal.  Cardiovascular Auscultation:Rythm- Regular. Murmurs & Other Heart Sounds:Auscultation of the heart  reveals- No Murmurs.   Neurologic Cranial Nerve exam:- CN III-XII intact(No nystagmus), symmetric smile. Strength:- 5/5 equal and symmetric strength both upper and lower extremities.  Skin- on hands and elbows prior rash is much imporved. Still faint mild rash. But does not have lichinified appearance.  Heent- exam negative. But faint raw appearance to tongue with faint whitsh film and tender to tough. Assessment:     Plan:

## 2015-01-02 ENCOUNTER — Other Ambulatory Visit: Payer: Self-pay | Admitting: Physician Assistant

## 2015-01-02 DIAGNOSIS — K219 Gastro-esophageal reflux disease without esophagitis: Secondary | ICD-10-CM

## 2015-01-02 MED ORDER — OMEPRAZOLE 20 MG PO CPDR: 20.0000 mg | DELAYED_RELEASE_CAPSULE | Freq: Every day | ORAL | Status: DC

## 2015-01-22 ENCOUNTER — Other Ambulatory Visit: Payer: Self-pay | Admitting: *Deleted

## 2015-01-22 DIAGNOSIS — F429 Obsessive-compulsive disorder, unspecified: Secondary | ICD-10-CM

## 2015-01-22 NOTE — Telephone Encounter (Signed)
Medication Detail      Disp Refills Start End     FLUoxetine HCl 60 MG TABS 90 tablet 1 08/29/2014     Sig - Route: Take 1 tablet by mouth daily. - Oral    E-Prescribing Status: Receipt confirmed by pharmacy (08/29/2014 11:43 AM EDT)     Associated Diagnoses    OCD (obsessive compulsive disorder)      Rx request Denied, Too Soon for request/SLS

## 2015-02-28 ENCOUNTER — Ambulatory Visit: Payer: 59 | Admitting: Physician Assistant

## 2015-03-14 ENCOUNTER — Ambulatory Visit: Payer: 59 | Admitting: Physician Assistant

## 2015-03-14 DIAGNOSIS — Z0289 Encounter for other administrative examinations: Secondary | ICD-10-CM

## 2015-03-19 ENCOUNTER — Telehealth: Payer: Self-pay | Admitting: Physician Assistant

## 2015-03-19 ENCOUNTER — Encounter: Payer: Self-pay | Admitting: Physician Assistant

## 2015-03-19 NOTE — Telephone Encounter (Signed)
charge 

## 2015-03-19 NOTE — Telephone Encounter (Signed)
PT was no show for appt on 03/14/15- lm on vm to reschedule same day. Letter sent- charge PT?

## 2015-03-29 ENCOUNTER — Telehealth: Payer: Self-pay | Admitting: *Deleted

## 2015-03-29 DIAGNOSIS — F429 Obsessive-compulsive disorder, unspecified: Secondary | ICD-10-CM

## 2015-03-29 MED ORDER — FLUOXETINE HCL 60 MG PO TABS: 1.0000 | ORAL_TABLET | Freq: Every day | ORAL | Status: DC

## 2015-03-29 NOTE — Telephone Encounter (Signed)
Pt is requesting refill for fluoxetine 60 mg tabs. She has no showed the last 2 appts with [facility]. Last OV was 08/29/14 with Einar Pheasant and has been seen by Percell Miller twice since. Please advise.

## 2015-03-29 NOTE — Telephone Encounter (Signed)
She can have 30 tablets with 0 refills.  Needs follow-up scheduled.

## 2015-03-29 NOTE — Telephone Encounter (Signed)
Med e-scribed to pharmacy for 30, 0 refills.

## 2015-05-22 ENCOUNTER — Ambulatory Visit (INDEPENDENT_AMBULATORY_CARE_PROVIDER_SITE_OTHER): Payer: 59 | Admitting: Physician Assistant

## 2015-05-22 ENCOUNTER — Encounter: Payer: Self-pay | Admitting: Physician Assistant

## 2015-05-22 VITALS — BP 154/92 | HR 82 | Temp 98.0°F | Ht 64.0 in | Wt 201.0 lb

## 2015-05-22 DIAGNOSIS — K219 Gastro-esophageal reflux disease without esophagitis: Secondary | ICD-10-CM

## 2015-05-22 DIAGNOSIS — F909 Attention-deficit hyperactivity disorder, unspecified type: Secondary | ICD-10-CM

## 2015-05-22 DIAGNOSIS — K140 Glossitis: Secondary | ICD-10-CM | POA: Diagnosis not present

## 2015-05-22 DIAGNOSIS — F429 Obsessive-compulsive disorder, unspecified: Secondary | ICD-10-CM

## 2015-05-22 DIAGNOSIS — F42 Obsessive-compulsive disorder: Secondary | ICD-10-CM

## 2015-05-22 DIAGNOSIS — F988 Other specified behavioral and emotional disorders with onset usually occurring in childhood and adolescence: Secondary | ICD-10-CM

## 2015-05-22 LAB — VITAMIN B12: Vitamin B-12: 285 pg/mL (ref 211–911)

## 2015-05-22 LAB — IBC PANEL
Iron: 22 ug/dL — ABNORMAL LOW (ref 42–145)
SATURATION RATIOS: 3.8 % — AB (ref 20.0–50.0)
Transferrin: 416 mg/dL — ABNORMAL HIGH (ref 212.0–360.0)

## 2015-05-22 LAB — IRON: Iron: 22 ug/dL — ABNORMAL LOW (ref 42–145)

## 2015-05-22 MED ORDER — CLOBETASOL PROPIONATE 0.05 % EX OINT: 1.0000 "application " | TOPICAL_OINTMENT | Freq: Two times a day (BID) | CUTANEOUS | Status: DC

## 2015-05-22 MED ORDER — FLUOXETINE HCL 60 MG PO TABS: 1.0000 | ORAL_TABLET | Freq: Every day | ORAL | Status: DC

## 2015-05-22 MED ORDER — OMEPRAZOLE 20 MG PO CPDR: 20.0000 mg | DELAYED_RELEASE_CAPSULE | Freq: Every day | ORAL | Status: DC

## 2015-05-22 NOTE — Progress Notes (Signed)
Patient presents to clinic today c/o for medication management.  ADD -- Patient endorses has been out of Adderall for several weeks and has been doing well. Want to discontinue the medication for now.  Depression -- Along with OCD. Currently well-controlled with Fluoxetine 60 mg daily without side effects. Denies depressed mood or anxiety. Compulsions well controlled with this medication.  GERD -- Doing very well with Omeprazole. Denies breakthrough symptoms.  Patient c/o burning and irritation of tongue that has been gradually worsening over the past 6 months. Does endorse history of iron deficiency and pika. Denies trauma or injury to mouth or tongue. Denies difficulty swallowing.  Past Medical History  Diagnosis Date  . GERD (gastroesophageal reflux disease)   . Seasonal allergies   . Environmental allergies   . Chicken pox   . Mumps   . Measles     Current Outpatient Prescriptions on File Prior to Visit  Medication Sig Dispense Refill  . cetirizine (ZYRTEC) 10 MG tablet Take 10 mg by mouth daily.     No current facility-administered medications on file prior to visit.    Allergies  Allergen Reactions  . Penicillins Anaphylaxis    Family History  Problem Relation Age of Onset  . Healthy Mother     Living  . Healthy Father     Living  . Diabetes Maternal Grandfather     Borderline  . COPD Maternal Aunt   . COPD Maternal Uncle   . Alzheimer's disease Maternal Grandmother   . Alzheimer's disease Paternal Grandfather   . Healthy Brother     x1  . Allergies Son     x1    History   Social History  . Marital Status: Single    Spouse Name: N/A  . Number of Children: N/A  . Years of Education: N/A   Social History Main Topics  . Smoking status: Current Every Day Smoker -- 1.00 packs/day for 30 years  . Smokeless tobacco: Never Used  . Alcohol Use: 6.0 oz/week    10 Shots of liquor per week  . Drug Use: No  . Sexual Activity: No   Other Topics Concern    . None   Social History Narrative   Review of Systems - See HPI.  All other ROS are negative.  BP 154/92 mmHg  Pulse 82  Temp(Src) 98 F (36.7 C) (Oral)  Ht 5\' 4"  (1.626 m)  Wt 201 lb (91.173 kg)  BMI 34.48 kg/m2  SpO2 98%  Physical Exam  Constitutional: She is oriented to person, place, and time and well-developed, well-nourished, and in no distress.  HENT:  Head: Normocephalic and atraumatic.  Right Ear: Tympanic membrane and ear canal normal.  Left Ear: Tympanic membrane and ear canal normal.  Mouth/Throat: Uvula is midline and mucous membranes are normal.  Eyes: Conjunctivae are normal.  Neck: Neck supple.  Cardiovascular: Normal rate, regular rhythm, normal heart sounds and intact distal pulses.   Pulmonary/Chest: Effort normal and breath sounds normal. No respiratory distress. She has no wheezes. She has no rales. She exhibits no tenderness.  Neurological: She is alert and oriented to person, place, and time.  Skin: Skin is warm and dry. No rash noted.  Psychiatric: Affect normal.  Vitals reviewed.  Assessment/Plan: GERD (gastroesophageal reflux disease) Doing well. Continue current regimen. Medications refilled.  ADD (attention deficit disorder) Will discontinue Adderall per patient preference. Follow-up PRN.  OCD (obsessive compulsive disorder) Doing very well with Fluoxetine daily. Mood also well controlled. Will continue  current regimen. Medication refilled. Follow-up 6 months.  Glossitis Encouraged Peroxyl mouthwash. Will check Iron panel and b12 level today.

## 2015-05-22 NOTE — Assessment & Plan Note (Signed)
Will discontinue Adderall per patient preference. Follow-up PRN.

## 2015-05-22 NOTE — Progress Notes (Signed)
Pre visit review using our clinic review tool, if applicable. No additional management support is needed unless otherwise documented below in the visit note. 

## 2015-05-22 NOTE — Assessment & Plan Note (Signed)
Doing very well with Fluoxetine daily. Mood also well controlled. Will continue current regimen. Medication refilled. Follow-up 6 months.

## 2015-05-22 NOTE — Assessment & Plan Note (Signed)
Encouraged Peroxyl mouthwash. Will check Iron panel and b12 level today.

## 2015-05-22 NOTE — Patient Instructions (Signed)
Please continue medications as directed except for Adderall which we are stopping. Please start the Clobetasol ointment as directed.  Use Peroxyl mouthwash daily to help calm down inflammation on tongue as well as to clean tongue well.  Go to the lab for blood work. I will call you with your results.

## 2015-05-22 NOTE — Assessment & Plan Note (Signed)
Doing well. Continue current regimen. Medications refilled.

## 2015-08-01 ENCOUNTER — Other Ambulatory Visit: Payer: Self-pay | Admitting: Physician Assistant

## 2015-08-01 DIAGNOSIS — Z1231 Encounter for screening mammogram for malignant neoplasm of breast: Secondary | ICD-10-CM

## 2015-08-09 ENCOUNTER — Ambulatory Visit (HOSPITAL_BASED_OUTPATIENT_CLINIC_OR_DEPARTMENT_OTHER)
Admission: RE | Admit: 2015-08-09 | Discharge: 2015-08-09 | Disposition: A | Discharge status: Home / Self Care | Payer: 59 | Source: Ambulatory Visit | Attending: Physician Assistant | Admitting: Physician Assistant

## 2015-08-09 DIAGNOSIS — Z1231 Encounter for screening mammogram for malignant neoplasm of breast: Secondary | ICD-10-CM | POA: Diagnosis present

## 2015-10-12 ENCOUNTER — Other Ambulatory Visit: Payer: Self-pay | Admitting: *Deleted

## 2015-10-12 DIAGNOSIS — F429 Obsessive-compulsive disorder, unspecified: Secondary | ICD-10-CM

## 2015-10-12 MED ORDER — FLUOXETINE HCL 60 MG PO TABS: 1.0000 | ORAL_TABLET | Freq: Every day | ORAL | Status: DC

## 2015-11-13 ENCOUNTER — Other Ambulatory Visit: Payer: Self-pay | Admitting: *Deleted

## 2015-11-13 DIAGNOSIS — K219 Gastro-esophageal reflux disease without esophagitis: Secondary | ICD-10-CM

## 2015-11-13 MED ORDER — OMEPRAZOLE 20 MG PO CPDR: 20.0000 mg | DELAYED_RELEASE_CAPSULE | Freq: Every day | ORAL | Status: DC

## 2015-11-13 NOTE — Telephone Encounter (Signed)
Rx sent to the pharmacy by e-script.//AB/CMA 

## 2015-11-23 ENCOUNTER — Encounter: Payer: 59 | Admitting: Physician Assistant

## 2015-11-30 ENCOUNTER — Encounter: Payer: Self-pay | Admitting: General Practice

## 2015-11-30 ENCOUNTER — Telehealth: Payer: Self-pay | Admitting: General Practice

## 2015-11-30 NOTE — Telephone Encounter (Signed)
Pre-visit phone call for pt completed. Chart updated to reflect.   

## 2015-12-03 ENCOUNTER — Encounter: Payer: Self-pay | Admitting: Physician Assistant

## 2015-12-03 ENCOUNTER — Other Ambulatory Visit (HOSPITAL_COMMUNITY)
Admission: RE | Admit: 2015-12-03 | Discharge: 2015-12-03 | Disposition: A | Discharge status: Home / Self Care | Payer: 59 | Source: Ambulatory Visit | Attending: Physician Assistant | Admitting: Physician Assistant

## 2015-12-03 ENCOUNTER — Ambulatory Visit (INDEPENDENT_AMBULATORY_CARE_PROVIDER_SITE_OTHER): Payer: 59 | Admitting: Physician Assistant

## 2015-12-03 VITALS — BP 148/78 | HR 82 | Temp 98.2°F | Resp 16 | Ht 64.0 in | Wt 199.6 lb

## 2015-12-03 DIAGNOSIS — Z01411 Encounter for gynecological examination (general) (routine) with abnormal findings: Secondary | ICD-10-CM | POA: Insufficient documentation

## 2015-12-03 DIAGNOSIS — Z0000 Encounter for general adult medical examination without abnormal findings: Secondary | ICD-10-CM

## 2015-12-03 DIAGNOSIS — Z124 Encounter for screening for malignant neoplasm of cervix: Secondary | ICD-10-CM

## 2015-12-03 DIAGNOSIS — R7989 Other specified abnormal findings of blood chemistry: Secondary | ICD-10-CM | POA: Diagnosis not present

## 2015-12-03 DIAGNOSIS — Z1151 Encounter for screening for human papillomavirus (HPV): Secondary | ICD-10-CM | POA: Diagnosis present

## 2015-12-03 DIAGNOSIS — K219 Gastro-esophageal reflux disease without esophagitis: Secondary | ICD-10-CM

## 2015-12-03 LAB — COMPREHENSIVE METABOLIC PANEL
ALT: 38 U/L — ABNORMAL HIGH (ref 0–35)
AST: 46 U/L — ABNORMAL HIGH (ref 0–37)
Albumin: 4.1 g/dL (ref 3.5–5.2)
Alkaline Phosphatase: 106 U/L (ref 39–117)
BUN: 13 mg/dL (ref 6–23)
CHLORIDE: 100 meq/L (ref 96–112)
CO2: 28 mEq/L (ref 19–32)
Calcium: 9.2 mg/dL (ref 8.4–10.5)
Creatinine, Ser: 0.83 mg/dL (ref 0.40–1.20)
GFR: 76.71 mL/min (ref >60.00)
GLUCOSE: 86 mg/dL (ref 70–99)
POTASSIUM: 3.6 meq/L (ref 3.5–5.1)
SODIUM: 139 meq/L (ref 135–145)
TOTAL PROTEIN: 7.4 g/dL (ref 6.0–8.3)
Total Bilirubin: 0.2 mg/dL (ref 0.2–1.2)

## 2015-12-03 LAB — URINALYSIS, ROUTINE W REFLEX MICROSCOPIC
BILIRUBIN URINE: NEGATIVE
Hgb urine dipstick: NEGATIVE
LEUKOCYTES UA: NEGATIVE
NITRITE: NEGATIVE
RBC / HPF: NONE SEEN (ref 0–?)
Specific Gravity, Urine: 1.02 (ref 1.000–1.030)
Urine Glucose: NEGATIVE
Urobilinogen, UA: 0.2 (ref 0.0–1.0)
WBC, UA: NONE SEEN (ref 0–?)
pH: 6.5 (ref 5.0–8.0)

## 2015-12-03 LAB — CBC
HEMATOCRIT: 39.8 % (ref 36.0–46.0)
Hemoglobin: 12.5 g/dL (ref 12.0–15.0)
MCHC: 31.4 g/dL (ref 30.0–36.0)
MCV: 80.9 fl (ref 78.0–100.0)
Platelets: 203 10*3/uL (ref 150.0–400.0)
RBC: 4.92 Mil/uL (ref 3.87–5.11)
RDW: 17.9 % — AB (ref 11.5–15.5)
WBC: 11.4 10*3/uL — AB (ref 4.0–10.5)

## 2015-12-03 LAB — LDL CHOLESTEROL, DIRECT: LDL DIRECT: 167 mg/dL

## 2015-12-03 LAB — VITAMIN D 25 HYDROXY (VIT D DEFICIENCY, FRACTURES): VITD: 16.64 ng/mL — AB (ref 30.00–100.00)

## 2015-12-03 LAB — TSH: TSH: 1.12 u[IU]/mL (ref 0.35–4.50)

## 2015-12-03 LAB — LIPID PANEL
CHOL/HDL RATIO: 6
CHOLESTEROL: 258 mg/dL — AB (ref 0–200)
HDL: 40.8 mg/dL (ref >39.00)

## 2015-12-03 LAB — HEMOGLOBIN A1C: HEMOGLOBIN A1C: 5.8 % (ref 4.6–6.5)

## 2015-12-03 NOTE — Patient Instructions (Signed)
Please go to the lab for blood work.  I will call you with your results. If your blood work is normal we will follow-up yearly for physicals.  If anything is abnormal we will treat and get you in for a follow-up.  I will call you once I have the results of your Cologuard testing.  Continue smoking cessation efforts.  Smoking Cessation, Tips for Success If you are ready to quit smoking, congratulations! You have chosen to help yourself be healthier. Cigarettes bring nicotine, tar, carbon monoxide, and other irritants into your body. Your lungs, heart, and blood vessels will be able to work better without these poisons. There are many different ways to quit smoking. Nicotine gum, nicotine patches, a nicotine inhaler, or nicotine nasal spray can help with physical craving. Hypnosis, support groups, and medicines help break the habit of smoking. WHAT THINGS CAN I DO TO MAKE QUITTING EASIER?  Here are some tips to help you quit for good:  Pick a date when you will quit smoking completely. Tell all of your friends and family about your plan to quit on that date.  Do not try to slowly cut down on the number of cigarettes you are smoking. Pick a quit date and quit smoking completely starting on that day.  Throw away all cigarettes.   Clean and remove all ashtrays from your home, work, and car.  On a card, write down your reasons for quitting. Carry the card with you and read it when you get the urge to smoke.  Cleanse your body of nicotine. Drink enough water and fluids to keep your urine clear or pale yellow. Do this after quitting to flush the nicotine from your body.  Learn to predict your moods. Do not let a bad situation be your excuse to have a cigarette. Some situations in your life might tempt you into wanting a cigarette.  Never have "just one" cigarette. It leads to wanting another and another. Remind yourself of your decision to quit.  Change habits associated with smoking. If you  smoked while driving or when feeling stressed, try other activities to replace smoking. Stand up when drinking your coffee. Brush your teeth after eating. Sit in a different chair when you read the paper. Avoid alcohol while trying to quit, and try to drink fewer caffeinated beverages. Alcohol and caffeine may urge you to smoke.  Avoid foods and drinks that can trigger a desire to smoke, such as sugary or spicy foods and alcohol.  Ask people who smoke not to smoke around you.  Have something planned to do right after eating or having a cup of coffee. For example, plan to take a walk or exercise.  Try a relaxation exercise to calm you down and decrease your stress. Remember, you may be tense and nervous for the first 2 weeks after you quit, but this will pass.  Find new activities to keep your hands busy. Play with a pen, coin, or rubber band. Doodle or draw things on paper.  Brush your teeth right after eating. This will help cut down on the craving for the taste of tobacco after meals. You can also try mouthwash.   Use oral substitutes in place of cigarettes. Try using lemon drops, carrots, cinnamon sticks, or chewing gum. Keep them handy so they are available when you have the urge to smoke.  When you have the urge to smoke, try deep breathing.  Designate your home as a nonsmoking area.  If you are a  heavy smoker, ask your health care provider about a prescription for nicotine chewing gum. It can ease your withdrawal from nicotine.  Reward yourself. Set aside the cigarette money you save and buy yourself something nice.  Look for support from others. Join a support group or smoking cessation program. Ask someone at home or at work to help you with your plan to quit smoking.  Always ask yourself, "Do I need this cigarette or is this just a reflex?" Tell yourself, "Today, I choose not to smoke," or "I do not want to smoke." You are reminding yourself of your decision to quit.  Do not  replace cigarette smoking with electronic cigarettes (commonly called e-cigarettes). The safety of e-cigarettes is unknown, and some may contain harmful chemicals.  If you relapse, do not give up! Plan ahead and think about what you will do the next time you get the urge to smoke. HOW WILL I FEEL WHEN I QUIT SMOKING? You may have symptoms of withdrawal because your body is used to nicotine (the addictive substance in cigarettes). You may crave cigarettes, be irritable, feel very hungry, cough often, get headaches, or have difficulty concentrating. The withdrawal symptoms are only temporary. They are strongest when you first quit but will go away within 10-14 days. When withdrawal symptoms occur, stay in control. Think about your reasons for quitting. Remind yourself that these are signs that your body is healing and getting used to being without cigarettes. Remember that withdrawal symptoms are easier to treat than the major diseases that smoking can cause.  Even after the withdrawal is over, expect periodic urges to smoke. However, these cravings are generally short lived and will go away whether you smoke or not. Do not smoke! WHAT RESOURCES ARE AVAILABLE TO HELP ME QUIT SMOKING? Your health care provider can direct you to community resources or hospitals for support, which may include:  Group support.  Education.  Hypnosis.  Therapy.   This information is not intended to replace advice given to you by your health care provider. Make sure you discuss any questions you have with your health care provider.   Document Released: 07/18/2004 Document Revised: 11/10/2014 Document Reviewed: 04/07/2013 Elsevier Interactive Patient Education 2016 [employer] for Adults, Female A healthy lifestyle and preventive care can promote health and wellness. Preventive health guidelines for women include the following key practices.  A routine yearly physical is a good way to check with  your health care provider about your health and preventive screening. It is a chance to share any concerns and updates on your health and to receive a thorough exam.  Visit your dentist for a routine exam and preventive care every 6 months. Brush your teeth twice a day and floss once a day. Good oral hygiene prevents tooth decay and gum disease.  The frequency of eye exams is based on your age, health, family medical history, use of contact lenses, and other factors. Follow your health care provider's recommendations for frequency of eye exams.  Eat a healthy diet. Foods like vegetables, fruits, whole grains, low-fat dairy products, and lean protein foods contain the nutrients you need without too many calories. Decrease your intake of foods high in solid fats, added sugars, and salt. Eat the right amount of calories for you.Get information about a proper diet from your health care provider, if necessary.  Regular physical exercise is one of the most important things you can do for your health. Most adults should get at least  150 minutes of moderate-intensity exercise (any activity that increases your heart rate and causes you to sweat) each week. In addition, most adults need muscle-strengthening exercises on 2 or more days a week.  Maintain a healthy weight. The body mass index (BMI) is a screening tool to identify possible weight problems. It provides an estimate of body fat based on height and weight. Your health care provider can find your BMI and can help you achieve or maintain a healthy weight.For adults 20 years and older:  A BMI below 18.5 is considered underweight.  A BMI of 18.5 to 24.9 is normal.  A BMI of 25 to 29.9 is considered overweight.  A BMI of 30 and above is considered obese.  Maintain normal blood lipids and cholesterol levels by exercising and minimizing your intake of saturated fat. Eat a balanced diet with plenty of fruit and vegetables. Blood tests for lipids and  cholesterol should begin at age 48 and be repeated every 5 years. If your lipid or cholesterol levels are high, you are over 50, or you are at high risk for heart disease, you may need your cholesterol levels checked more frequently.Ongoing high lipid and cholesterol levels should be treated with medicines if diet and exercise are not working.  If you smoke, find out from your health care provider how to quit. If you do not use tobacco, do not start.  Lung cancer screening is recommended for adults aged 55-80 years who are at high risk for developing lung cancer because of a history of smoking. A yearly low-dose CT scan of the lungs is recommended for people who have at least a 30-pack-year history of smoking and are a current smoker or have quit within the past 15 years. A pack year of smoking is smoking an average of 1 pack of cigarettes a day for 1 year (for example: 1 pack a day for 30 years or 2 packs a day for 15 years). Yearly screening should continue until the smoker has stopped smoking for at least 15 years. Yearly screening should be stopped for people who develop a health problem that would prevent them from having lung cancer treatment.  If you are pregnant, do not drink alcohol. If you are breastfeeding, be very cautious about drinking alcohol. If you are not pregnant and choose to drink alcohol, do not have more than 1 drink per day. One drink is considered to be 12 ounces (355 mL) of beer, 5 ounces (148 mL) of wine, or 1.5 ounces (44 mL) of liquor.  Avoid use of street drugs. Do not share needles with anyone. Ask for help if you need support or instructions about stopping the use of drugs.  High blood pressure causes heart disease and increases the risk of stroke. Your blood pressure should be checked at least every 1 to 2 years. Ongoing high blood pressure should be treated with medicines if weight loss and exercise do not work.  If you are 3-80 years old, ask your health care provider  if you should take aspirin to prevent strokes.  Diabetes screening is done by taking a blood sample to check your blood glucose level after you have not eaten for a certain period of time (fasting). If you are not overweight and you do not have risk factors for diabetes, you should be screened once every 3 years starting at age 3. If you are overweight or obese and you are 59-36 years of age, you should be screened for diabetes every year  as part of your cardiovascular risk assessment.  Breast cancer screening is essential preventive care for women. You should practice "breast self-awareness." This means understanding the normal appearance and feel of your breasts and may include breast self-examination. Any changes detected, no matter how small, should be reported to a health care provider. Women in their 75s and 30s should have a clinical breast exam (CBE) by a health care provider as part of a regular health exam every 1 to 3 years. After age 22, women should have a CBE every year. Starting at age 58, women should consider having a mammogram (breast X-ray test) every year. Women who have a family history of breast cancer should talk to their health care provider about genetic screening. Women at a high risk of breast cancer should talk to their health care providers about having an MRI and a mammogram every year.  Breast cancer gene (BRCA)-related cancer risk assessment is recommended for women who have family members with BRCA-related cancers. BRCA-related cancers include breast, ovarian, tubal, and peritoneal cancers. Having family members with these cancers may be associated with an increased risk for harmful changes (mutations) in the breast cancer genes BRCA1 and BRCA2. Results of the assessment will determine the need for genetic counseling and BRCA1 and BRCA2 testing.  Your health care provider may recommend that you be screened regularly for cancer of the pelvic organs (ovaries, uterus, and  vagina). This screening involves a pelvic examination, including checking for microscopic changes to the surface of your cervix (Pap test). You may be encouraged to have this screening done every 3 years, beginning at age 47.  For women ages 72-65, health care providers may recommend pelvic exams and Pap testing every 3 years, or they may recommend the Pap and pelvic exam, combined with testing for human papilloma virus (HPV), every 5 years. Some types of HPV increase your risk of cervical cancer. Testing for HPV may also be done on women of any age with unclear Pap test results.  Other health care providers may not recommend any screening for nonpregnant women who are considered low risk for pelvic cancer and who do not have symptoms. Ask your health care provider if a screening pelvic exam is right for you.  If you have had past treatment for cervical cancer or a condition that could lead to cancer, you need Pap tests and screening for cancer for at least 20 years after your treatment. If Pap tests have been discontinued, your risk factors (such as having a new sexual partner) need to be reassessed to determine if screening should resume. Some women have medical problems that increase the chance of getting cervical cancer. In these cases, your health care provider may recommend more frequent screening and Pap tests.  Colorectal cancer can be detected and often prevented. Most routine colorectal cancer screening begins at the age of 63 years and continues through age 58 years. However, your health care provider may recommend screening at an earlier age if you have risk factors for colon cancer. On a yearly basis, your health care provider may provide home test kits to check for hidden blood in the stool. Use of a small camera at the end of a tube, to directly examine the colon (sigmoidoscopy or colonoscopy), can detect the earliest forms of colorectal cancer. Talk to your health care provider about this at  age 64, when routine screening begins. Direct exam of the colon should be repeated every 5-10 years through age 79 years, unless early  forms of precancerous polyps or small growths are found.  People who are at an increased risk for hepatitis B should be screened for this virus. You are considered at high risk for hepatitis B if:  You were born in a country where hepatitis B occurs often. Talk with your health care provider about which countries are considered high risk.  Your parents were born in a high-risk country and you have not received a shot to protect against hepatitis B (hepatitis B vaccine).  You have HIV or AIDS.  You use needles to inject street drugs.  You live with, or have sex with, someone who has hepatitis B.  You get hemodialysis treatment.  You take certain medicines for conditions like cancer, organ transplantation, and autoimmune conditions.  Hepatitis C blood testing is recommended for all people born from [birth year] through 1967 and any individual with known risks for hepatitis C.  Practice safe sex. Use condoms and avoid high-risk sexual practices to reduce the spread of sexually transmitted infections (STIs). STIs include gonorrhea, chlamydia, syphilis, trichomonas, herpes, HPV, and human immunodeficiency virus (HIV). Herpes, HIV, and HPV are viral illnesses that have no cure. They can result in disability, cancer, and death.  You should be screened for sexually transmitted illnesses (STIs) including gonorrhea and chlamydia if:  You are sexually active and are younger than 24 years.  You are older than 24 years and your health care provider tells you that you are at risk for this type of infection.  Your sexual activity has changed since you were last screened and you are at an increased risk for chlamydia or gonorrhea. Ask your health care provider if you are at risk.  If you are at risk of being infected with HIV, it is recommended that you take a prescription  medicine daily to prevent HIV infection. This is called preexposure prophylaxis (PrEP). You are considered at risk if:  You are sexually active and do not regularly use condoms or know the HIV status of your partner(s).  You take drugs by injection.  You are sexually active with a partner who has HIV.  Talk with your health care provider about whether you are at high risk of being infected with HIV. If you choose to begin PrEP, you should first be tested for HIV. You should then be tested every 3 months for as long as you are taking PrEP.  Osteoporosis is a disease in which the bones lose minerals and strength with aging. This can result in serious bone fractures or breaks. The risk of osteoporosis can be identified using a bone density scan. Women ages 23 years and over and women at risk for fractures or osteoporosis should discuss screening with their health care providers. Ask your health care provider whether you should take a calcium supplement or vitamin D to reduce the rate of osteoporosis.  Menopause can be associated with physical symptoms and risks. Hormone replacement therapy is available to decrease symptoms and risks. You should talk to your health care provider about whether hormone replacement therapy is right for you.  Use sunscreen. Apply sunscreen liberally and repeatedly throughout the day. You should seek shade when your shadow is shorter than you. Protect yourself by wearing long sleeves, pants, a wide-brimmed hat, and sunglasses year round, whenever you are outdoors.  Once a month, do a whole body skin exam, using a mirror to look at the skin on your back. Tell your health care provider of new moles, moles that have irregular  borders, moles that are larger than a pencil eraser, or moles that have changed in shape or color.  Stay current with required vaccines (immunizations).  Influenza vaccine. All adults should be immunized every year.  Tetanus, diphtheria, and acellular  pertussis (Td, Tdap) vaccine. Pregnant women should receive 1 dose of Tdap vaccine during each pregnancy. The dose should be obtained regardless of the length of time since the last dose. Immunization is preferred during the 27th-36th week of gestation. An adult who has not previously received Tdap or who does not know her vaccine status should receive 1 dose of Tdap. This initial dose should be followed by tetanus and diphtheria toxoids (Td) booster doses every 10 years. Adults with an unknown or incomplete history of completing a 3-dose immunization series with Td-containing vaccines should begin or complete a primary immunization series including a Tdap dose. Adults should receive a Td booster every 10 years.  Varicella vaccine. An adult without evidence of immunity to varicella should receive 2 doses or a second dose if she has previously received 1 dose. Pregnant females who do not have evidence of immunity should receive the first dose after pregnancy. This first dose should be obtained before leaving the health care facility. The second dose should be obtained 4-8 weeks after the first dose.  Human papillomavirus (HPV) vaccine. Females aged 13-26 years who have not received the vaccine previously should obtain the 3-dose series. The vaccine is not recommended for use in pregnant females. However, pregnancy testing is not needed before receiving a dose. If a female is found to be pregnant after receiving a dose, no treatment is needed. In that case, the remaining doses should be delayed until after the pregnancy. Immunization is recommended for any person with an immunocompromised condition through the age of 17 years if she did not get any or all doses earlier. During the 3-dose series, the second dose should be obtained 4-8 weeks after the first dose. The third dose should be obtained 24 weeks after the first dose and 16 weeks after the second dose.  Zoster vaccine. One dose is recommended for adults  aged 86 years or older unless certain conditions are present.  Measles, mumps, and rubella (MMR) vaccine. Adults born before [birth year] generally are considered immune to measles and mumps. Adults born in [birth year] or later should have 1 or more doses of MMR vaccine unless there is a contraindication to the vaccine or there is laboratory evidence of immunity to each of the three diseases. A routine second dose of MMR vaccine should be obtained at least 28 days after the first dose for students attending postsecondary schools, health care workers, or international travelers. People who received inactivated measles vaccine or an unknown type of measles vaccine during 1963-1967 should receive 2 doses of MMR vaccine. People who received inactivated mumps vaccine or an unknown type of mumps vaccine before 1979 and are at high risk for mumps infection should consider immunization with 2 doses of MMR vaccine. For females of childbearing age, rubella immunity should be determined. If there is no evidence of immunity, females who are not pregnant should be vaccinated. If there is no evidence of immunity, females who are pregnant should delay immunization until after pregnancy. Unvaccinated health care workers born before [birth year] who lack laboratory evidence of measles, mumps, or rubella immunity or laboratory confirmation of disease should consider measles and mumps immunization with 2 doses of MMR vaccine or rubella immunization with 1 dose of MMR vaccine.  Pneumococcal 13-valent  conjugate (PCV13) vaccine. When indicated, a person who is uncertain of his immunization history and has no record of immunization should receive the PCV13 vaccine. All adults 75 years of age and older should receive this vaccine. An adult aged 6 years or older who has certain medical conditions and has not been previously immunized should receive 1 dose of PCV13 vaccine. This PCV13 should be followed with a dose of pneumococcal polysaccharide (PPSV23)  vaccine. Adults who are at high risk for pneumococcal disease should obtain the PPSV23 vaccine at least 8 weeks after the dose of PCV13 vaccine. Adults older than 52 years of age who have normal immune system function should obtain the PPSV23 vaccine dose at least 1 year after the dose of PCV13 vaccine.  Pneumococcal polysaccharide (PPSV23) vaccine. When PCV13 is also indicated, PCV13 should be obtained first. All adults aged 27 years and older should be immunized. An adult younger than age 28 years who has certain medical conditions should be immunized. Any person who resides in a nursing home or long-term care facility should be immunized. An adult smoker should be immunized. People with an immunocompromised condition and certain other conditions should receive both PCV13 and PPSV23 vaccines. People with human immunodeficiency virus (HIV) infection should be immunized as soon as possible after diagnosis. Immunization during chemotherapy or radiation therapy should be avoided. Routine use of PPSV23 vaccine is not recommended for American Indians, [place] Natives, or people younger than 65 years unless there are medical conditions that require PPSV23 vaccine. When indicated, people who have unknown immunization and have no record of immunization should receive PPSV23 vaccine. One-time revaccination 5 years after the first dose of PPSV23 is recommended for people aged 19-64 years who have chronic kidney failure, nephrotic syndrome, asplenia, or immunocompromised conditions. People who received 1-2 doses of PPSV23 before age 80 years should receive another dose of PPSV23 vaccine at age 36 years or later if at least 5 years have passed since the previous dose. Doses of PPSV23 are not needed for people immunized with PPSV23 at or after age 35 years.  Meningococcal vaccine. Adults with asplenia or persistent complement component deficiencies should receive 2 doses of quadrivalent meningococcal conjugate (MenACWY-D)  vaccine. The doses should be obtained at least 2 months apart. Microbiologists working with certain meningococcal bacteria, [employer] recruits, people at risk during an outbreak, and people who travel to or live in countries with a high rate of meningitis should be immunized. A first-year college student up through age 49 years who is living in a residence hall should receive a dose if she did not receive a dose on or after her 16th birthday. Adults who have certain high-risk conditions should receive one or more doses of vaccine.  Hepatitis A vaccine. Adults who wish to be protected from this disease, have certain high-risk conditions, work with hepatitis A-infected animals, work in hepatitis A research labs, or travel to or work in countries with a high rate of hepatitis A should be immunized. Adults who were previously unvaccinated and who anticipate close contact with an international adoptee during the first 60 days after arrival in the [place] from a country with a high rate of hepatitis A should be immunized.  Hepatitis B vaccine. Adults who wish to be protected from this disease, have certain high-risk conditions, may be exposed to blood or other infectious body fluids, are household contacts or sex partners of hepatitis B positive people, are clients or workers in certain care facilities, or travel to or  work in countries with a high rate of hepatitis B should be immunized.  Haemophilus influenzae type b (Hib) vaccine. A previously unvaccinated person with asplenia or sickle cell disease or having a scheduled splenectomy should receive 1 dose of Hib vaccine. Regardless of previous immunization, a recipient of a hematopoietic stem cell transplant should receive a 3-dose series 6-12 months after her successful transplant. Hib vaccine is not recommended for adults with HIV infection. Preventive Services / Frequency Ages 29 to 36 years  Blood pressure check.** / Every 3-5 years.  Lipid and  cholesterol check.** / Every 5 years beginning at age 74.  Clinical breast exam.** / Every 3 years for women in their 89s and 85s.  BRCA-related cancer risk assessment.** / For women who have family members with a BRCA-related cancer (breast, ovarian, tubal, or peritoneal cancers).  Pap test.** / Every 2 years from ages 14 through 64. Every 3 years starting at age 27 through age 49 or 43 with a history of 3 consecutive normal Pap tests.  HPV screening.** / Every 3 years from ages 63 through ages 30 to 65 with a history of 3 consecutive normal Pap tests.  Hepatitis C blood test.** / For any individual with known risks for hepatitis C.  Skin self-exam. / Monthly.  Influenza vaccine. / Every year.  Tetanus, diphtheria, and acellular pertussis (Tdap, Td) vaccine.** / Consult your health care provider. Pregnant women should receive 1 dose of Tdap vaccine during each pregnancy. 1 dose of Td every 10 years.  Varicella vaccine.** / Consult your health care provider. Pregnant females who do not have evidence of immunity should receive the first dose after pregnancy.  HPV vaccine. / 3 doses over 6 months, if 4 and younger. The vaccine is not recommended for use in pregnant females. However, pregnancy testing is not needed before receiving a dose.  Measles, mumps, rubella (MMR) vaccine.** / You need at least 1 dose of MMR if you were born in 1957 or later. You may also need a 2nd dose. For females of childbearing age, rubella immunity should be determined. If there is no evidence of immunity, females who are not pregnant should be vaccinated. If there is no evidence of immunity, females who are pregnant should delay immunization until after pregnancy.  Pneumococcal 13-valent conjugate (PCV13) vaccine.** / Consult your health care provider.  Pneumococcal polysaccharide (PPSV23) vaccine.** / 1 to 2 doses if you smoke cigarettes or if you have certain conditions.  Meningococcal vaccine.** / 1 dose if  you are age 54 to 67 years and a Market researcher living in a residence hall, or have one of several medical conditions, you need to get vaccinated against meningococcal disease. You may also need additional booster doses.  Hepatitis A vaccine.** / Consult your health care provider.  Hepatitis B vaccine.** / Consult your health care provider.  Haemophilus influenzae type b (Hib) vaccine.** / Consult your health care provider. Ages 70 to 51 years  Blood pressure check.** / Every year.  Lipid and cholesterol check.** / Every 5 years beginning at age 6 years.  Lung cancer screening. / Every year if you are aged 30-80 years and have a 30-pack-year history of smoking and currently smoke or have quit within the past 15 years. Yearly screening is stopped once you have quit smoking for at least 15 years or develop a health problem that would prevent you from having lung cancer treatment.  Clinical breast exam.** / Every year after age 55 years.  BRCA-related  cancer risk assessment.** / For women who have family members with a BRCA-related cancer (breast, ovarian, tubal, or peritoneal cancers).  Mammogram.** / Every year beginning at age 70 years and continuing for as long as you are in good health. Consult with your health care provider.  Pap test.** / Every 3 years starting at age 33 years through age 29 or 67 years with a history of 3 consecutive normal Pap tests.  HPV screening.** / Every 3 years from ages 27 years through ages 2 to 4 years with a history of 3 consecutive normal Pap tests.  Fecal occult blood test (FOBT) of stool. / Every year beginning at age 83 years and continuing until age 9 years. You may not need to do this test if you get a colonoscopy every 10 years.  Flexible sigmoidoscopy or colonoscopy.** / Every 5 years for a flexible sigmoidoscopy or every 10 years for a colonoscopy beginning at age 76 years and continuing until age 30 years.  Hepatitis C blood  test.** / For all people born from [birth year] through 1965 and any individual with known risks for hepatitis C.  Skin self-exam. / Monthly.  Influenza vaccine. / Every year.  Tetanus, diphtheria, and acellular pertussis (Tdap/Td) vaccine.** / Consult your health care provider. Pregnant women should receive 1 dose of Tdap vaccine during each pregnancy. 1 dose of Td every 10 years.  Varicella vaccine.** / Consult your health care provider. Pregnant females who do not have evidence of immunity should receive the first dose after pregnancy.  Zoster vaccine.** / 1 dose for adults aged 49 years or older.  Measles, mumps, rubella (MMR) vaccine.** / You need at least 1 dose of MMR if you were born in 1957 or later. You may also need a second dose. For females of childbearing age, rubella immunity should be determined. If there is no evidence of immunity, females who are not pregnant should be vaccinated. If there is no evidence of immunity, females who are pregnant should delay immunization until after pregnancy.  Pneumococcal 13-valent conjugate (PCV13) vaccine.** / Consult your health care provider.  Pneumococcal polysaccharide (PPSV23) vaccine.** / 1 to 2 doses if you smoke cigarettes or if you have certain conditions.  Meningococcal vaccine.** / Consult your health care provider.  Hepatitis A vaccine.** / Consult your health care provider.  Hepatitis B vaccine.** / Consult your health care provider.  Haemophilus influenzae type b (Hib) vaccine.** / Consult your health care provider. Ages 85 years and over  Blood pressure check.** / Every year.  Lipid and cholesterol check.** / Every 5 years beginning at age 42 years.  Lung cancer screening. / Every year if you are aged 65-80 years and have a 30-pack-year history of smoking and currently smoke or have quit within the past 15 years. Yearly screening is stopped once you have quit smoking for at least 15 years or develop a health problem that would  prevent you from having lung cancer treatment.  Clinical breast exam.** / Every year after age 66 years.  BRCA-related cancer risk assessment.** / For women who have family members with a BRCA-related cancer (breast, ovarian, tubal, or peritoneal cancers).  Mammogram.** / Every year beginning at age 31 years and continuing for as long as you are in good health. Consult with your health care provider.  Pap test.** / Every 3 years starting at age 29 years through age 21 or 25 years with 3 consecutive normal Pap tests. Testing can be stopped between 65 and 70 years  with 3 consecutive normal Pap tests and no abnormal Pap or HPV tests in the past 10 years.  HPV screening.** / Every 3 years from ages 50 years through ages 25 or 27 years with a history of 3 consecutive normal Pap tests. Testing can be stopped between 65 and 70 years with 3 consecutive normal Pap tests and no abnormal Pap or HPV tests in the past 10 years.  Fecal occult blood test (FOBT) of stool. / Every year beginning at age 42 years and continuing until age 90 years. You may not need to do this test if you get a colonoscopy every 10 years.  Flexible sigmoidoscopy or colonoscopy.** / Every 5 years for a flexible sigmoidoscopy or every 10 years for a colonoscopy beginning at age 42 years and continuing until age 45 years.  Hepatitis C blood test.** / For all people born from [birth year] through 1965 and any individual with known risks for hepatitis C.  Osteoporosis screening.** / A one-time screening for women ages 50 years and over and women at risk for fractures or osteoporosis.  Skin self-exam. / Monthly.  Influenza vaccine. / Every year.  Tetanus, diphtheria, and acellular pertussis (Tdap/Td) vaccine.** / 1 dose of Td every 10 years.  Varicella vaccine.** / Consult your health care provider.  Zoster vaccine.** / 1 dose for adults aged 29 years or older.  Pneumococcal 13-valent conjugate (PCV13) vaccine.** / Consult your health  care provider.  Pneumococcal polysaccharide (PPSV23) vaccine.** / 1 dose for all adults aged 74 years and older.  Meningococcal vaccine.** / Consult your health care provider.  Hepatitis A vaccine.** / Consult your health care provider.  Hepatitis B vaccine.** / Consult your health care provider.  Haemophilus influenzae type b (Hib) vaccine.** / Consult your health care provider. ** Family history and personal history of risk and conditions may change your health care provider's recommendations.   This information is not intended to replace advice given to you by your health care provider. Make sure you discuss any questions you have with your health care provider.   Document Released: 12/16/2001 Document Revised: 11/10/2014 Document Reviewed: 03/17/2011 Elsevier Interactive Patient Education [employer].

## 2015-12-03 NOTE — Assessment & Plan Note (Signed)
Depression screen negative. Health Maintenance reviewed -- PAP updated. Flu shot and mammogram up-to-date. Breast exam without abnormal findings. . Preventive schedule discussed and handout given in AVS. Will obtain fasting labs today.

## 2015-12-03 NOTE — Progress Notes (Signed)
Patient presents to clinic today for annual exam.  Patient is fasting for labs.  Chronic Issues: OCD -- Endorses doing very well with her Fluoxetine. Denies breakthrough symptoms. Denies anxiety or depression.  GERD -- rare symptoms controlled with her Prilosec. Denies abdominal pain, nausea or vomiting.  Health Maintenance: Immunizations -- flu shot up-to-date. Is unsure of Tetanus. Colonoscopy -- up-to-date Mammogram -- up-to-date PAP -- overdue; has history of abnormal PAP. Denies vaginal concerns today.  Past Medical History  Diagnosis Date  . GERD (gastroesophageal reflux disease)   . Seasonal allergies   . Environmental allergies   . Chicken pox   . Mumps   . Measles     Past Surgical History  Procedure Laterality Date  . Tonsillectomy  1984  . Wisdom tooth extraction    . Essure tubal ligation      Current Outpatient Prescriptions on File Prior to Visit  Medication Sig Dispense Refill  . cetirizine (ZYRTEC) 10 MG tablet Take 10 mg by mouth daily.    . clobetasol ointment (TEMOVATE) [id number] % Apply 1 application topically 2 (two) times daily. 30 g 0  . FLUoxetine HCl 60 MG TABS Take 1 tablet by mouth daily. 90 tablet 1  . omeprazole (PRILOSEC) 20 MG capsule Take 1 capsule (20 mg total) by mouth daily. 90 capsule 1   No current facility-administered medications on file prior to visit.    Allergies  Allergen Reactions  . Penicillins Anaphylaxis    Family History  Problem Relation Age of Onset  . Healthy Mother     Living  . Healthy Father     Living  . Diabetes Maternal Grandfather     Borderline  . COPD Maternal Aunt   . COPD Maternal Uncle   . Alzheimer's disease Maternal Grandmother   . Alzheimer's disease Paternal Grandfather   . Healthy Brother     x1  . Allergies Son     x1    Social History   Social History  . Marital Status: Single    Spouse Name: N/A  . Number of Children: N/A  . Years of Education: N/A   Occupational History  .  Not on file.   Social History Main Topics  . Smoking status: Current Every Day Smoker -- 1.00 packs/day for 30 years  . Smokeless tobacco: Never Used  . Alcohol Use: 6.0 oz/week    10 Shots of liquor per week  . Drug Use: No  . Sexual Activity: No   Other Topics Concern  . Not on file   Social History Narrative   Review of Systems  Constitutional: Negative for fever and weight loss.  HENT: Negative for ear discharge, ear pain, hearing loss and tinnitus.   Eyes: Negative for blurred vision, double vision, photophobia and pain.  Respiratory: Negative for cough and shortness of breath.   Cardiovascular: Negative for chest pain and palpitations.  Gastrointestinal: Negative for heartburn, nausea, vomiting, abdominal pain, diarrhea, constipation, blood in stool and melena.  Genitourinary: Negative for dysuria, urgency, frequency, hematuria and flank pain.  Musculoskeletal: Negative for falls.  Neurological: Negative for dizziness, loss of consciousness and headaches.  Endo/Heme/Allergies: Negative for environmental allergies.  Psychiatric/Behavioral: Negative for depression, suicidal ideas, hallucinations and substance abuse. The patient is not nervous/anxious and does not have insomnia.    BP 148/78 mmHg  Pulse 82  Temp(Src) 98.2 F (36.8 C) (Oral)  Resp 16  Ht 5\' 4"  (1.626 m)  Wt 199 lb 9.6 oz (90.538 kg)  BMI 34.24 kg/m2  SpO2 99%  Physical Exam  Constitutional: She is oriented to person, place, and time and well-developed, well-nourished, and in no distress.  HENT:  Head: Normocephalic and atraumatic.  Right Ear: Tympanic membrane, external ear and ear canal normal.  Left Ear: Tympanic membrane, external ear and ear canal normal.  Nose: Nose normal. No mucosal edema.  Mouth/Throat: Uvula is midline, oropharynx is clear and moist and mucous membranes are normal. No oropharyngeal exudate or posterior oropharyngeal erythema.  Eyes: Conjunctivae are normal. Pupils are equal,  round, and reactive to light.  Neck: Neck supple. No thyromegaly present.  Cardiovascular: Normal rate, regular rhythm, normal heart sounds and intact distal pulses.   Pulmonary/Chest: Effort normal and breath sounds normal. No respiratory distress. She has no wheezes. She has no rales. Right breast exhibits no inverted nipple, no mass, no nipple discharge, no skin change and no tenderness. Left breast exhibits no inverted nipple, no mass, no nipple discharge, no skin change and no tenderness. Breasts are symmetrical.  Abdominal: Soft. Bowel sounds are normal. She exhibits no distension and no mass. There is no tenderness. There is no rebound and no guarding.  Genitourinary: Vagina normal, uterus normal, cervix normal, right adnexa normal, left adnexa normal and vulva normal.  Chaperone present for GU and breast exam  Lymphadenopathy:    She has no cervical adenopathy.  Neurological: She is alert and oriented to person, place, and time. No cranial nerve deficit.  Skin: Skin is warm and dry. No rash noted.  Psychiatric: Affect normal.  Vitals reviewed.  Assessment/Plan: GERD (gastroesophageal reflux disease) Stable. Continue current regimen.  Visit for preventive health examination Depression screen negative. Health Maintenance reviewed -- PAP updated. Flu shot and mammogram up-to-date. Breast exam without abnormal findings. . Preventive schedule discussed and handout given in AVS. Will obtain fasting labs today.

## 2015-12-03 NOTE — Progress Notes (Signed)
Pre visit review using our clinic review tool, if applicable. No additional management support is needed unless otherwise documented below in the visit note. 

## 2015-12-03 NOTE — Assessment & Plan Note (Signed)
Stable  Continue current regimen  

## 2015-12-04 LAB — CYTOLOGY - PAP

## 2015-12-05 ENCOUNTER — Telehealth: Payer: Self-pay | Admitting: Physician Assistant

## 2015-12-05 DIAGNOSIS — E559 Vitamin D deficiency, unspecified: Secondary | ICD-10-CM

## 2015-12-05 DIAGNOSIS — R899 Unspecified abnormal finding in specimens from other organs, systems and tissues: Secondary | ICD-10-CM

## 2015-12-05 DIAGNOSIS — E782 Mixed hyperlipidemia: Secondary | ICD-10-CM

## 2015-12-05 MED ORDER — VITAMIN D (ERGOCALCIFEROL) 1.25 MG (50000 UNIT) PO CAPS: 50000.0000 [IU] | ORAL_CAPSULE | ORAL | Status: DC

## 2015-12-05 MED ORDER — FENOFIBRATE 160 MG PO TABS: 160.0000 mg | ORAL_TABLET | Freq: Every day | ORAL | Status: DC

## 2015-12-05 NOTE — Telephone Encounter (Signed)
Spoke with patient concerning lab results -- She will return to lab in 2-3 weeks for repeat CBC and CMP. Orders placed.  Recommended increased hydration and decreased consumption of alcohol and use of tylenol-containing products.  Will begin fenofibrate 160 mcg daily for TGL levels. Supportive measures reviewed. Will also start Ergocalciferol 50,000 units once weekly for Vitmain D deficiency. Medications sent in.

## 2016-01-14 ENCOUNTER — Ambulatory Visit (INDEPENDENT_AMBULATORY_CARE_PROVIDER_SITE_OTHER): Payer: 59 | Admitting: Physician Assistant

## 2016-01-14 ENCOUNTER — Encounter: Payer: Self-pay | Admitting: Physician Assistant

## 2016-01-14 VITALS — BP 155/93 | HR 73 | Temp 98.0°F | Ht 64.0 in | Wt 206.0 lb

## 2016-01-14 DIAGNOSIS — R21 Rash and other nonspecific skin eruption: Secondary | ICD-10-CM | POA: Diagnosis not present

## 2016-01-14 MED ORDER — PREDNISONE 10 MG PO TABS: ORAL_TABLET | ORAL | Status: DC

## 2016-01-14 MED FILL — predniSONE 10 MG TABS: 10 | 12 days supply | Qty: 30 | Fill #0

## 2016-01-14 NOTE — Assessment & Plan Note (Signed)
Contact dermatitis mixed with exacerbation of psoriasis. Will begin prednisone taper. Antihistamines and OTC measures reviewed. Follow-up if not improving.

## 2016-01-14 NOTE — Patient Instructions (Signed)
Please take the steroid as directed. Continue lotions  (I recommend Eucerin) No more use of the Tide!  Follow-up if symptoms are not improving. We may have to get a dermatologist on board.

## 2016-01-14 NOTE — Progress Notes (Signed)
Patient presents to clinic today c/o rash of arms and legs bilaterally x 2 weeks. Endorses rash also on the torso and chest.. Denies change in lotions but did switch detergent to tide right before symptom onset.. Patient with history of psoriasis and notes this is different..   Past Medical History  Diagnosis Date  . GERD (gastroesophageal reflux disease)   . Seasonal allergies   . Environmental allergies   . Chicken pox   . Mumps   . Measles     Current Outpatient Prescriptions on File Prior to Visit  Medication Sig Dispense Refill  . cetirizine (ZYRTEC) 10 MG tablet Take 10 mg by mouth daily.    Marland Kitchen FLUoxetine HCl 60 MG TABS Take 1 tablet by mouth daily. 90 tablet 1  . omeprazole (PRILOSEC) 20 MG capsule Take 1 capsule (20 mg total) by mouth daily. 90 capsule 1  . fenofibrate 160 MG tablet Take 1 tablet (160 mg total) by mouth daily. (Patient not taking: Reported on 01/14/2016) 30 tablet 3  . Vitamin D, Ergocalciferol, (DRISDOL) 50000 units CAPS capsule Take 1 capsule (50,000 Units total) by mouth every 7 (seven) days. (Patient not taking: Reported on 01/14/2016) 12 capsule 0   No current facility-administered medications on file prior to visit.    Allergies  Allergen Reactions  . Penicillins Anaphylaxis    Family History  Problem Relation Age of Onset  . Healthy Mother     Living  . Healthy Father     Living  . Diabetes Maternal Grandfather     Borderline  . COPD Maternal Aunt   . COPD Maternal Uncle   . Alzheimer's disease Maternal Grandmother   . Alzheimer's disease Paternal Grandfather   . Healthy Brother     x1  . Allergies Son     x1    Social History   Social History  . Marital Status: Single    Spouse Name: N/A  . Number of Children: N/A  . Years of Education: N/A   Social History Main Topics  . Smoking status: Current Every Day Smoker -- 1.00 packs/day for 30 years  . Smokeless tobacco: Never Used  . Alcohol Use: 6.0 oz/week    10 Shots of liquor  per week  . Drug Use: No  . Sexual Activity: No   Other Topics Concern  . None   Social History Narrative   Review of Systems - See HPI.  All other ROS are negative.  BP 155/93 mmHg  Pulse 73  Temp(Src) 98 F (36.7 C) (Oral)  Ht 5' 4" (1.626 m)  Wt 206 lb (93.441 kg)  BMI 35.34 kg/m2  SpO2 99%  Physical Exam  Constitutional: She is oriented to person, place, and time and well-developed, well-nourished, and in no distress.  HENT:  Head: Normocephalic and atraumatic.  Eyes: Conjunctivae are normal.  Neck: Neck supple.  Cardiovascular: Normal rate, regular rhythm, normal heart sounds and intact distal pulses.   Pulmonary/Chest: Effort normal and breath sounds normal. No respiratory distress. She has no wheezes. She has no rales. She exhibits no tenderness.  Neurological: She is alert and oriented to person, place, and time.  Skin: Skin is warm and dry. Rash noted. Rash is maculopapular.  Psychiatric: Affect normal.  Vitals reviewed.   Recent Results (from the past 2160 hour(s))  Cytology - PAP     Status: None   Collection Time: 12/03/15 12:00 AM  Result Value Ref Range   CYTOLOGY - PAP PAP RESULT  CBC     Status: Abnormal   Collection Time: 12/03/15  9:16 AM  Result Value Ref Range   WBC 11.4 (H) 4.0 - 10.5 K/uL   RBC 4.92 3.87 - 5.11 Mil/uL   Platelets 203.0 150.0 - 400.0 K/uL   Hemoglobin 12.5 12.0 - 15.0 g/dL   HCT 39.8 36.0 - 46.0 %   MCV 80.9 78.0 - 100.0 fl   MCHC 31.4 30.0 - 36.0 g/dL   RDW 17.9 (H) 11.5 - 15.5 %  Comp Met (CMET)     Status: Abnormal   Collection Time: 12/03/15  9:16 AM  Result Value Ref Range   Sodium 139 135 - 145 mEq/L   Potassium 3.6 3.5 - 5.1 mEq/L   Chloride 100 96 - 112 mEq/L   CO2 28 19 - 32 mEq/L   Glucose, Bld 86 70 - 99 mg/dL   BUN 13 6 - 23 mg/dL   Creatinine, Ser 0.83 0.40 - 1.20 mg/dL   Total Bilirubin 0.2 0.2 - 1.2 mg/dL   Alkaline Phosphatase 106 39 - 117 U/L   AST 46 (H) 0 - 37 U/L   ALT 38 (H) 0 - 35 U/L   Total  Protein 7.4 6.0 - 8.3 g/dL   Albumin 4.1 3.5 - 5.2 g/dL   Calcium 9.2 8.4 - 10.5 mg/dL   GFR 76.71 >60.00 mL/min  TSH     Status: None   Collection Time: 12/03/15  9:16 AM  Result Value Ref Range   TSH 1.12 0.35 - 4.50 uIU/mL  Urinalysis, Routine w reflex microscopic     Status: Abnormal   Collection Time: 12/03/15  9:16 AM  Result Value Ref Range   Color, Urine YELLOW Yellow;Lt. Yellow   APPearance CLEAR Clear   Specific Gravity, Urine 1.020 1.000-1.030   pH 6.5 5.0 - 8.0   Total Protein, Urine TRACE (A) Negative   Urine Glucose NEGATIVE Negative   Ketones, ur TRACE (A) Negative   Bilirubin Urine NEGATIVE Negative   Hgb urine dipstick NEGATIVE Negative   Urobilinogen, UA 0.2 0.0 - 1.0   Leukocytes, UA NEGATIVE Negative   Nitrite NEGATIVE Negative   WBC, UA none seen 0-2/hpf   RBC / HPF none seen 0-2/hpf   Squamous Epithelial / LPF Rare(0-4/hpf) Rare(0-4/hpf)   Ca Oxalate Crys, UA Presence of (A) None  Hemoglobin A1c     Status: None   Collection Time: 12/03/15  9:16 AM  Result Value Ref Range   Hgb A1c MFr Bld 5.8 4.6 - 6.5 %    Comment: Glycemic Control Guidelines for People with Diabetes:Non Diabetic:  <6%Goal of Therapy: <7%Additional Action Suggested:  >8%   Lipid panel     Status: Abnormal   Collection Time: 12/03/15  9:16 AM  Result Value Ref Range   Cholesterol 258 (H) 0 - 200 mg/dL    Comment: ATP III Classification       Desirable:  < 200 mg/dL               Borderline High:  200 - 239 mg/dL          High:  > = 240 mg/dL   Triglycerides (H) 0.0 - 149.0 mg/dL    456.0 Triglyceride is over 400; calculations on Lipids are invalid.    Comment: Normal:  <150 mg/dLBorderline High:  150 - 199 mg/dL   HDL 40.80 >39.00 mg/dL   Total CHOL/HDL Ratio 6     Comment:  Men          Women1/2 Average Risk     3.4          3.3Average Risk          5.0          4.42X Average Risk          9.6          7.13X Average Risk          15.0          11.0                        Vitamin D (25 hydroxy)     Status: Abnormal   Collection Time: 12/03/15  9:16 AM  Result Value Ref Range   VITD 16.64 (L) 30.00 - 100.00 ng/mL  LDL cholesterol, direct     Status: None   Collection Time: 12/03/15  9:16 AM  Result Value Ref Range   Direct LDL 167.0 mg/dL    Comment: Optimal:  <100 mg/dLNear or Above Optimal:  100-129 mg/dLBorderline High:  130-159 mg/dLHigh:  160-189 mg/dLVery High:  >190 mg/dL    Assessment/Plan: Rash and nonspecific skin eruption Contact dermatitis mixed with exacerbation of psoriasis. Will begin prednisone taper. Antihistamines and OTC measures reviewed. Follow-up if not improving.

## 2016-04-29 ENCOUNTER — Telehealth: Payer: Self-pay | Admitting: *Deleted

## 2016-04-29 DIAGNOSIS — K219 Gastro-esophageal reflux disease without esophagitis: Secondary | ICD-10-CM

## 2016-04-29 MED ORDER — OMEPRAZOLE 20 MG PO CPDR: 20.0000 mg | DELAYED_RELEASE_CAPSULE | Freq: Every day | ORAL | Status: DC

## 2016-04-29 MED FILL — OMEPRAZOLE DR 20 MG CAPSULE: 20 | 90 days supply | Qty: 90 | Fill #0

## 2016-04-29 NOTE — Telephone Encounter (Signed)
Rx request to pharmacy/SLS  

## 2016-05-16 ENCOUNTER — Telehealth: Payer: Self-pay | Admitting: *Deleted

## 2016-05-16 DIAGNOSIS — F429 Obsessive-compulsive disorder, unspecified: Secondary | ICD-10-CM

## 2016-05-16 MED ORDER — FLUOXETINE HCL 60 MG PO TABS: 1.0000 | ORAL_TABLET | Freq: Every day | ORAL | Status: DC

## 2016-05-16 NOTE — Telephone Encounter (Signed)
Refill sent per [facility] refill protocol/SLS  

## 2016-06-11 ENCOUNTER — Ambulatory Visit (INDEPENDENT_AMBULATORY_CARE_PROVIDER_SITE_OTHER): Payer: 59 | Admitting: Physician Assistant

## 2016-06-11 ENCOUNTER — Encounter: Payer: Self-pay | Admitting: Physician Assistant

## 2016-06-11 VITALS — BP 142/96 | HR 83 | Temp 98.3°F | Resp 16 | Ht 64.0 in | Wt 199.1 lb

## 2016-06-11 DIAGNOSIS — J309 Allergic rhinitis, unspecified: Secondary | ICD-10-CM

## 2016-06-11 DIAGNOSIS — E781 Pure hyperglyceridemia: Secondary | ICD-10-CM | POA: Diagnosis not present

## 2016-06-11 DIAGNOSIS — Z23 Encounter for immunization: Secondary | ICD-10-CM

## 2016-06-11 DIAGNOSIS — E559 Vitamin D deficiency, unspecified: Secondary | ICD-10-CM | POA: Diagnosis not present

## 2016-06-11 DIAGNOSIS — G44209 Tension-type headache, unspecified, not intractable: Secondary | ICD-10-CM | POA: Diagnosis not present

## 2016-06-11 DIAGNOSIS — R7989 Other specified abnormal findings of blood chemistry: Secondary | ICD-10-CM

## 2016-06-11 DIAGNOSIS — L659 Nonscarring hair loss, unspecified: Secondary | ICD-10-CM

## 2016-06-11 DIAGNOSIS — G4700 Insomnia, unspecified: Secondary | ICD-10-CM

## 2016-06-11 DIAGNOSIS — R945 Abnormal results of liver function studies: Secondary | ICD-10-CM

## 2016-06-11 DIAGNOSIS — R21 Rash and other nonspecific skin eruption: Secondary | ICD-10-CM

## 2016-06-11 DIAGNOSIS — F429 Obsessive-compulsive disorder, unspecified: Secondary | ICD-10-CM

## 2016-06-11 LAB — COMPREHENSIVE METABOLIC PANEL
ALT: 47 U/L — AB (ref 0–35)
AST: 66 U/L — ABNORMAL HIGH (ref 0–37)
Albumin: 4.4 g/dL (ref 3.5–5.2)
Alkaline Phosphatase: 107 U/L (ref 39–117)
BUN: 9 mg/dL (ref 6–23)
CHLORIDE: 101 meq/L (ref 96–112)
CO2: 29 meq/L (ref 19–32)
Calcium: 9.5 mg/dL (ref 8.4–10.5)
Creatinine, Ser: 0.7 mg/dL (ref 0.40–1.20)
GFR: 93.18 mL/min (ref >60.00)
GLUCOSE: 78 mg/dL (ref 70–99)
POTASSIUM: 3.8 meq/L (ref 3.5–5.1)
Sodium: 139 mEq/L (ref 135–145)
Total Bilirubin: 0.5 mg/dL (ref 0.2–1.2)
Total Protein: 7.6 g/dL (ref 6.0–8.3)

## 2016-06-11 LAB — LIPID PANEL
CHOL/HDL RATIO: 6
Cholesterol: 268 mg/dL — ABNORMAL HIGH (ref 0–200)
HDL: 47.3 mg/dL (ref >39.00)
NONHDL: 221.16
TRIGLYCERIDES: 275 mg/dL — AB (ref 0.0–149.0)
VLDL: 55 mg/dL — AB (ref 0.0–40.0)

## 2016-06-11 LAB — LDL CHOLESTEROL, DIRECT: LDL DIRECT: 194 mg/dL

## 2016-06-11 MED ORDER — FLUTICASONE PROPIONATE 50 MCG/ACT NA SUSP: 2.0000 | Freq: Every day | NASAL | 2 refills | Status: DC

## 2016-06-11 MED ORDER — TIZANIDINE HCL 4 MG PO TABS: 4.0000 mg | ORAL_TABLET | Freq: Every day | ORAL | 0 refills | Status: DC

## 2016-06-11 MED ORDER — TRAZODONE HCL 50 MG PO TABS: 25.0000 mg | ORAL_TABLET | Freq: Every evening | ORAL | 1 refills | Status: AC | PRN

## 2016-06-11 NOTE — Progress Notes (Signed)
Patient presents to clinic today for multiple issues.  Patient endorses mother noting a small bald spot in her hair on the left occipital region, forming after a fall in May where she bumped her head on the floor after a fall, having a mild concussion. Denies LOC. Denies having any symptoms after the incident other then mild soreness at the current area of concern.   Patient also endorses noting R sided neck tension and R sided headache described as a tightness and pulling sensation. Denies trauma or injury. Denies photophobia and phonophobia. Denies vision changes.  Denies lightheadedness. Does note some mild PND over the past couple of days. Denies cough, chills, fever. Denies sinus pain. Has history of allergies for which she takes an over-the-counter.   Patient also notes issue with staying asleep. Endorses falling asleep without difficulty but will wake up a few times during the night and eventually gets up about 3 or 4 AM each day.  Patient also following up on her mood and OCD. Is currently on Fluoxetine 60 mg daily. Has previously done very well on this regimen. Endorses currently doing well. Denies breakthrough symptoms.   Patient is due for repeal labs regarding elevated Triglycerides and LFT. Is also due for repeat Vitamin D level after completing course of once weekly Ergocalciferol 50,000 units.   Patient is also requesting to update her tetanus shot today. Endorses last > 10 years ago.  Past Medical History:  Diagnosis Date  . Chicken pox   . Environmental allergies   . GERD (gastroesophageal reflux disease)   . Measles   . Mumps   . Seasonal allergies     Current Outpatient Prescriptions on File Prior to Visit  Medication Sig Dispense Refill  . cetirizine (ZYRTEC) 10 MG tablet Take 10 mg by mouth daily.    Marland Kitchen FLUoxetine HCl 60 MG TABS Take 1 tablet by mouth daily. 90 tablet 0  . omeprazole (PRILOSEC) 20 MG capsule Take 1 capsule (20 mg total) by mouth daily. 90 capsule 1   . fenofibrate 160 MG tablet Take 1 tablet (160 mg total) by mouth daily. (Patient not taking: Reported on 06/11/2016) 30 tablet 3   No current facility-administered medications on file prior to visit.     Allergies  Allergen Reactions  . Penicillins Anaphylaxis    Family History  Problem Relation Age of Onset  . Healthy Mother     Living  . Healthy Father     Living  . Diabetes Maternal Grandfather     Borderline  . COPD Maternal Aunt   . COPD Maternal Uncle   . Alzheimer's disease Maternal Grandmother   . Alzheimer's disease Paternal Grandfather   . Healthy Brother     x1  . Allergies Son     x1    Social History   Social History  . Marital status: Single    Spouse name: N/A  . Number of children: N/A  . Years of education: N/A   Social History Main Topics  . Smoking status: Current Every Day Smoker    Packs/day: 1.00    Years: 30.00  . Smokeless tobacco: Never Used  . Alcohol use 6.0 oz/week    10 Shots of liquor per week  . Drug use: No  . Sexual activity: No   Other Topics Concern  . None   Social History Narrative  . None    Review of Systems - See HPI.  All other ROS are negative.  BP (!) 142/96 (  BP Location: Left Arm, Patient Position: Sitting, Cuff Size: Normal)   Pulse 83   Temp 98.3 F (36.8 C) (Oral)   Resp 16   Ht '5\' 4"'$  (1.626 m)   Wt 199 lb 2 oz (90.3 kg)   SpO2 98%   BMI 34.18 kg/m   Physical Exam  Constitutional: She is oriented to person, place, and time and well-developed, well-nourished, and in no distress.  HENT:  Head: Normocephalic and atraumatic.  Right Ear: Tympanic membrane and external ear normal.  Left Ear: Tympanic membrane and external ear normal.  Nose: Mucosal edema and rhinorrhea present. Right sinus exhibits no maxillary sinus tenderness and no frontal sinus tenderness. Left sinus exhibits no maxillary sinus tenderness and no frontal sinus tenderness.  Mouth/Throat: Uvula is midline, oropharynx is clear and moist  and mucous membranes are normal.  Eyes: Conjunctivae are normal.  Neck: Normal range of motion. Muscular tenderness present. No spinous process tenderness present. No neck rigidity.  Cardiovascular: Normal rate, regular rhythm, normal heart sounds and intact distal pulses.   Pulmonary/Chest: Effort normal and breath sounds normal. No respiratory distress. She has no wheezes. She has no rales. She exhibits no tenderness.  Musculoskeletal:       Cervical back: She exhibits tenderness and spasm. She exhibits no bony tenderness.  Neurological: She is alert and oriented to person, place, and time.  Skin: Skin is warm and dry. Rash noted.  Chronic psoriatic plaques noted. There are not angular papules noted of anterior lower extremities. No striae noted.   Psychiatric: Affect normal.  Vitals reviewed.  Assessment/Plan: 1. Vitamin D deficiency Has completed course of ergocalciferol 50,000 units once weekly. We'll repeat vitamin D level today. - Vitamin D 1,25 dihydroxy  2. Elevated liver function tests Has minimized alcohol and Tylenol-containing products. Only mild elevation noted previously. We'll repeat liver function testing today. If still elevated will need ultrasound to assess for hepatic steatosis or other cause of elevation. - Comp Met (CMET)  3. High triglycerides We'll repeat lipid profile today. - Comp Met (CMET) - Lipid Profile  4. Tension headache Rx tizanidine. Stretches discussed. Topical icy hot and heating pad recommended. Follow-up if not resolving. - tiZANidine (ZANAFLEX) 4 MG tablet; Take 1 tablet (4 mg total) by mouth at bedtime.  Dispense: 15 tablet; Refill: 0  5. Hair loss Small area of left occiput, dime-sized. This is the area where she hit her head a few months ago. Do not see evidence of hair pulling or fungal infection. Do not see any evidence of female pattern hair loss. Discussed with patient that hair may or may not regrow in this area.   6. Insomnia We'll  begin trial of trazodone 25 mg to help with sleep. She is not to start this medication until off of the tizanidine. Follow-up scheduled. - traZODone (DESYREL) 50 MG tablet; Take 0.5 tablets (25 mg total) by mouth at bedtime as needed for sleep.  Dispense: 30 tablet; Refill: 1  7. OCD (obsessive compulsive disorder) Doing very well on current regimen. Will continue current medications. Follow-up in 6 months.  8. Allergic rhinitis, unspecified allergic rhinitis type With postnasal drip. No signs of infection. Will continue over-the-counter antihistamines. We'll begin Flonase 2 sprays next nostril daily. She is to follow-up if symptoms not improving. - fluticasone (FLONASE) 50 MCG/ACT nasal spray; Place 2 sprays into both nostrils daily.  Dispense: 16 g; Refill: 2  9. Rash and non-specific skin eruption Patient with history of psoriasis. Newly lesions on anterior lower  extremities. Some of these lesions seem violaceous papular. No wickham striae noted. Atypical psoriasis versus LP.  referral placed to dermatology for further assessment and management.Hassell Done, Sandria Manly, PA-C

## 2016-06-11 NOTE — Patient Instructions (Signed)
Please go to the lab for blood work. I will call with results.  For the tension headache -- please use Zanaflex as directed in the evening. Apply Icy Hot to the area. Try to avoid prolonged flexion of the neck. A heating pad will also be beneficial. Follow-up if symptoms are not resolving.  For allergies and drainage -- continue your zyrtec. Start the Flonase as directed (I have sent in a prescription.).  For the rash -- You will be contacted by Dermatology for further assessment.  For sleep -- start the Trazodone as directed. Do not start this medication until after you have stopped the Zanaflex for the tension headaches.  Please continue all chronic medications.

## 2016-06-11 NOTE — Progress Notes (Signed)
Pre visit review using our clinic review tool, if applicable. No additional management support is needed unless otherwise documented below in the visit note/SLS  

## 2016-06-14 LAB — VITAMIN D 1,25 DIHYDROXY
VITAMIN D 1, 25 (OH) TOTAL: 43 pg/mL (ref 18–72)
VITAMIN D2 1, 25 (OH): 22 pg/mL
VITAMIN D3 1, 25 (OH): 21 pg/mL

## 2016-06-16 ENCOUNTER — Telehealth: Payer: Self-pay | Admitting: *Deleted

## 2016-06-16 DIAGNOSIS — E782 Mixed hyperlipidemia: Secondary | ICD-10-CM

## 2016-06-16 MED ORDER — ATORVASTATIN CALCIUM 10 MG PO TABS: 10.0000 mg | ORAL_TABLET | Freq: Every day | ORAL | 3 refills | Status: DC

## 2016-06-16 MED ORDER — FENOFIBRATE 160 MG PO TABS: 160.0000 mg | ORAL_TABLET | Freq: Every day | ORAL | 3 refills | Status: DC

## 2016-06-16 NOTE — Telephone Encounter (Signed)
-----   Message from Brunetta Jeans, PA-C sent at 06/16/2016  7:27 AM EDT ----- Labs reviewed. Look good overall. Her TGL and LDL cholesterol are too high. Restart fenofibrate at previous dose. Ok to send in refills for patient. Also need to start statin. Start with Lipitor 10 mg daily as her liver enzymes are mildly elevated. Want her to FU 1 month for reassessment and repeat lipid and LFT.

## 2016-06-16 NOTE — Telephone Encounter (Signed)
Patient informed, understood & agreed; new Rx to pharmacy, f/U appt scheduled/SLS

## 2016-07-23 ENCOUNTER — Telehealth: Payer: Self-pay | Admitting: Physician Assistant

## 2016-07-23 ENCOUNTER — Ambulatory Visit: Payer: 59 | Admitting: Physician Assistant

## 2016-07-23 NOTE — Telephone Encounter (Signed)
Patient called at 10:10 stating she could not keep her 5 PM appointment today and will call later to reschedule. Charge or No Charge?

## 2016-07-23 NOTE — Telephone Encounter (Signed)
No charge for 1st no-show 

## 2016-08-04 ENCOUNTER — Other Ambulatory Visit: Payer: Self-pay | Admitting: Physician Assistant

## 2016-08-04 DIAGNOSIS — Z1231 Encounter for screening mammogram for malignant neoplasm of breast: Secondary | ICD-10-CM

## 2016-08-06 ENCOUNTER — Ambulatory Visit (HOSPITAL_BASED_OUTPATIENT_CLINIC_OR_DEPARTMENT_OTHER)
Admission: RE | Admit: 2016-08-06 | Discharge: 2016-08-06 | Disposition: A | Discharge status: Home / Self Care | Payer: 59 | Source: Ambulatory Visit | Attending: Physician Assistant | Admitting: Physician Assistant

## 2016-08-06 DIAGNOSIS — Z1231 Encounter for screening mammogram for malignant neoplasm of breast: Secondary | ICD-10-CM | POA: Insufficient documentation

## 2016-08-08 NOTE — Progress Notes (Signed)
lmovm 10/6 pc

## 2016-10-10 ENCOUNTER — Encounter: Payer: Self-pay | Admitting: Emergency Medicine

## 2016-10-10 ENCOUNTER — Other Ambulatory Visit: Payer: Self-pay | Admitting: Emergency Medicine

## 2016-10-10 MED ORDER — FLUTICASONE PROPIONATE 50 MCG/ACT NA SUSP: 2.0000 | Freq: Every day | NASAL | 2 refills | Status: DC

## 2016-11-11 ENCOUNTER — Telehealth: Payer: Self-pay | Admitting: Physician Assistant

## 2016-11-11 ENCOUNTER — Other Ambulatory Visit: Payer: Self-pay | Admitting: Emergency Medicine

## 2016-11-11 ENCOUNTER — Other Ambulatory Visit: Payer: Self-pay | Admitting: Physician Assistant

## 2016-11-11 DIAGNOSIS — F429 Obsessive-compulsive disorder, unspecified: Secondary | ICD-10-CM

## 2016-11-11 MED ORDER — FLUOXETINE HCL 60 MG PO TABS: 1.0000 | ORAL_TABLET | Freq: Every day | ORAL | 0 refills | Status: DC

## 2016-11-11 NOTE — Telephone Encounter (Signed)
Ok with me 

## 2016-11-11 NOTE — Telephone Encounter (Signed)
Patient would like to transfer from [place] to [place], please advise °

## 2016-11-11 NOTE — Telephone Encounter (Signed)
Ok to switch to me

## 2016-11-11 NOTE — Telephone Encounter (Signed)
Pt called needing a refill on Prozac. She went to pharmacy and they said that she needed prior authorization from her insurance. Pharmacy did have a Rx of the 20 mg of Prozac that they could give her (she did not state if she recieved it) but, she wants to speak with you personally about the refill. Please call back at [phone]  Thanks. -KE

## 2016-11-12 MED ORDER — FLUOXETINE HCL 60 MG PO TABS: 1.0000 | ORAL_TABLET | Freq: Every day | ORAL | 0 refills | Status: DC

## 2016-11-12 NOTE — Telephone Encounter (Signed)
Patient scheduled for 03/02/2017 at 8:15am with Percell Miller

## 2016-11-12 NOTE — Telephone Encounter (Signed)
    11/11/16 5:00 PM  Mackie Pai, PA-C routed this conversation to Me  Mackie Pai, PA-C      11/11/16 4:59 PM  Note    Ok to switch to me?

## 2016-11-13 NOTE — Telephone Encounter (Signed)
Patient has appointment scheduled with Lauren Case on 03/02/17 to transfer care. Patient is requesting to have the refill refaxed to the pharmacy. Please advise.

## 2016-11-13 NOTE — Telephone Encounter (Signed)
Pt was given #30 and 0 on 11/12/16 and advised that she needed a follow up to receive more refills.

## 2016-11-13 NOTE — Telephone Encounter (Signed)
She would need to either see me or schedule an acute/follow-up with Percell Miller prior to her official transfer appt.

## 2016-11-14 ENCOUNTER — Telehealth: Payer: Self-pay | Admitting: Physician Assistant

## 2016-11-14 NOTE — Telephone Encounter (Signed)
Pt states that ins will not cover the Rx for fluoxetine 60mg  taking 1 a day and asking to go back to fluoxetine 20mg  3 x a day, [facility].

## 2016-11-17 NOTE — Telephone Encounter (Signed)
Pt calling checking status on this and has been out of meds for a week now. Please advise.

## 2016-11-18 ENCOUNTER — Other Ambulatory Visit: Payer: Self-pay | Admitting: Physician Assistant

## 2016-11-18 MED ORDER — FLUOXETINE HCL 20 MG PO TABS: 60.0000 mg | ORAL_TABLET | Freq: Every day | ORAL | 0 refills | Status: DC

## 2016-11-18 NOTE — Telephone Encounter (Signed)
Returned called to patient and advised of pcp message that refill is being sent to pharmacy for Prozac 20mg  and patient needs to take 3 times per day.  Also, per pcp staff is working on prior authorization with insurance for the 60mg  dosage.  Patient voiced understanding and was appreciative of [place]'s assistance.  She is requesting follow up on prior authorization once office hears back from insurance company.

## 2016-11-18 NOTE — Telephone Encounter (Signed)
Reviewing notes -- refill sent in on 11/12/16 of her Prozac 60 mg daily. Seems insurance will not pay for 60 mg dose -- note in chart regarding this but not sent to me. As such what we will need to do is send in a prescription for the 20 mg dose -- she will have to take 3 per day to equal 60. We will at the same time work on a prior [employer] for the 60 mg capsule to get her insurance to approve so hopefully we can go back to that regimen.   I certainly apologize for the delay but on my end I thought refill was sent in and everything taken care of.

## 2016-11-18 NOTE — Telephone Encounter (Signed)
Patient calling to office to report she is concerned that she has call office multiple times to request refill of prozac and has yet to hear back from someone at the office regarding the status of request.  Patient states she has been out of medication for 1 week now and she needs meds asap.  Per note in chart from [place] on 11/13/16, I informed patient she needs to schedule an appt in order to get refill.  Patient states she will  call the [facility] office to get an appointment but would like her concern to be brought to the attention of our office manager.  Forwarding message to Engineer, building services and pcp.

## 2016-11-18 NOTE — Telephone Encounter (Signed)
Starting the PA thru cover my meds. Waiting on response from [employer].

## 2016-11-18 NOTE — Telephone Encounter (Signed)
See other phone note

## 2016-11-21 NOTE — Telephone Encounter (Signed)
Received denial letter from [employer] -- states the 60 mg tablet/capsule is a plan exclusion so they will not pay. Please assess how much the 20 mg tablets cost her -- we could continue that dose of pill having her take 60 mg daily if it is affordable. Just hate that she will have to take 3 tablets daily.   Attempted to reach patient. No answer. LMOVM for callback to discuss.

## 2016-11-24 NOTE — Telephone Encounter (Signed)
LMOVM of details advising patient [employer] will not cover the 60 mg of Prozac, she will continue the 20 mg 3 tabs daily.

## 2016-12-11 ENCOUNTER — Ambulatory Visit (INDEPENDENT_AMBULATORY_CARE_PROVIDER_SITE_OTHER): Payer: 59 | Admitting: Medical

## 2016-12-11 ENCOUNTER — Encounter: Payer: Self-pay | Admitting: Medical

## 2016-12-11 VITALS — BP 146/74 | HR 90 | Temp 98.2°F | Ht 64.0 in | Wt 202.8 lb

## 2016-12-11 DIAGNOSIS — R05 Cough: Secondary | ICD-10-CM | POA: Diagnosis not present

## 2016-12-11 DIAGNOSIS — J029 Acute pharyngitis, unspecified: Secondary | ICD-10-CM | POA: Diagnosis not present

## 2016-12-11 DIAGNOSIS — J40 Bronchitis, not specified as acute or chronic: Secondary | ICD-10-CM

## 2016-12-11 DIAGNOSIS — H6690 Otitis media, unspecified, unspecified ear: Secondary | ICD-10-CM

## 2016-12-11 DIAGNOSIS — R059 Cough, unspecified: Secondary | ICD-10-CM

## 2016-12-11 MED ORDER — AZITHROMYCIN 250 MG PO TABS: ORAL_TABLET | ORAL | 0 refills | Status: DC

## 2016-12-11 MED ORDER — BENZONATATE 100 MG PO CAPS: 100.0000 mg | ORAL_CAPSULE | Freq: Three times a day (TID) | ORAL | 0 refills | Status: DC | PRN

## 2016-12-11 NOTE — Progress Notes (Signed)
Pre visit review using our clinic tool,if applicable. No additional management support is needed unless otherwise documented below in the visit note.  

## 2016-12-11 NOTE — Patient Instructions (Signed)
You appear to have bronchitis. Rest hydrate and tylenol for fever. I am prescribing cough medicine benzonatate, and azithromycinantibiotic. For your nasal congestion use your flonase  You should gradually get better. If not then notify us and would recommend a chest xray.  Your rt ear looks early infected. Your st likely from the constant cough but azithromycin has good strep coverage.  Follow up in 7-10 days or as needed

## 2016-12-11 NOTE — Progress Notes (Signed)
Subjective:    Patient ID: Lauren Case, female    DOB: 08/11/1966, 51 y.o.   MRN: [id number]  HPI  Pt had one week nasal and chest congestion and runny nose. Cough is productive at beginning. As cough has persisted st got worse. No fever but faint chills. No diffuse severe body aches. No wheezing.    Pt is a smoker.  Review of Systems  Constitutional: Positive for chills. Negative for fatigue.  HENT: Positive for congestion and sore throat. Negative for sinus pain, sinus pressure and trouble swallowing.   Respiratory: Positive for cough. Negative for chest tightness, shortness of breath and wheezing.        Chest congestion.  Cardiovascular: Negative for chest pain and palpitations.  Gastrointestinal: Negative for abdominal pain.  Musculoskeletal: Negative for back pain, myalgias and neck pain.  Skin: Negative for rash.  Neurological: Negative for dizziness and headaches.  Hematological: Negative for adenopathy. Does not bruise/bleed easily.  Psychiatric/Behavioral: Negative for behavioral problems, confusion and sleep disturbance. The patient is not nervous/anxious.     Past Medical History:  Diagnosis Date  . Chicken pox   . Environmental allergies   . GERD (gastroesophageal reflux disease)   . Measles   . Mumps   . Seasonal allergies      Social History   Social History  . Marital status: Single    Spouse name: N/A  . Number of children: N/A  . Years of education: N/A   Occupational History  . Not on file.   Social History Main Topics  . Smoking status: Current Every Day Smoker    Packs/day: 1.00    Years: 30.00  . Smokeless tobacco: Never Used  . Alcohol use 6.0 oz/week    10 Shots of liquor per week  . Drug use: No  . Sexual activity: No   Other Topics Concern  . Not on file   Social History Narrative  . No narrative on file    Past Surgical History:  Procedure Laterality Date  . ESSURE TUBAL LIGATION    . TONSILLECTOMY  1984  . WISDOM  TOOTH EXTRACTION      Family History  Problem Relation Age of Onset  . Healthy Mother     Living  . Healthy Father     Living  . Diabetes Maternal Grandfather     Borderline  . COPD Maternal Aunt   . COPD Maternal Uncle   . Alzheimer's disease Maternal Grandmother   . Alzheimer's disease Paternal Grandfather   . Healthy Brother     x1  . Allergies Son     x1    Allergies  Allergen Reactions  . Penicillins Anaphylaxis    Current Outpatient Prescriptions on File Prior to Visit  Medication Sig Dispense Refill  . atorvastatin (LIPITOR) 10 MG tablet Take 1 tablet (10 mg total) by mouth daily. 30 tablet 3  . cetirizine (ZYRTEC) 10 MG tablet Take 10 mg by mouth daily.    . fenofibrate 160 MG tablet Take 1 tablet (160 mg total) by mouth daily. 30 tablet 3  . FLUoxetine (PROZAC) 20 MG tablet Take 3 tablets (60 mg total) by mouth daily. 90 tablet 0  . fluticasone (FLONASE) 50 MCG/ACT nasal spray Place 2 sprays into both nostrils daily. 16 g 2  . omeprazole (PRILOSEC) 20 MG capsule Take 1 capsule (20 mg total) by mouth daily. 90 capsule 1  . tiZANidine (ZANAFLEX) 4 MG tablet Take 1 tablet (4 mg total) by  mouth at bedtime. 15 tablet 0  . traZODone (DESYREL) 50 MG tablet Take 0.5 tablets (25 mg total) by mouth at bedtime as needed for sleep. 30 tablet 1   No current facility-administered medications on file prior to visit.     BP (!) 146/74   Pulse 90   Temp 98.2 F (36.8 C) (Oral)   Ht 5\' 4"  (1.626 m)   Wt 202 lb 12.8 oz (92 kg)   SpO2 98%   BMI 34.81 kg/m       Objective:   Physical Exam   General  Mental Status - Alert. General Appearance - Well groomed. Not in acute distress.  Skin Rashes- No Rashes.  HEENT Head- Normal. Ear Auditory Canal - Left- Normal. Right - Normal.Tympanic Membrane- Left- Normal. Right- mild red Eye Sclera/Conjunctiva- Left- Normal. Right- Normal. Nose & Sinuses Nasal Mucosa- Left-  Boggy and Congested. Right-  Boggy and   Congested.Bilateral maxillary and frontal sinus pressure. Mouth & Throat Lips: Upper Lip- Normal: no dryness, cracking, pallor, cyanosis, or vesicular eruption. Lower Lip-Normal: no dryness, cracking, pallor, cyanosis or vesicular eruption. Buccal Mucosa- Bilateral- No Aphthous ulcers. Oropharynx- No Discharge or Erythema. Tonsils: Characteristics- Bilateral- No Erythema or Congestion. Size/Enlargement- Bilateral- No enlargement. Discharge- bilateral-None.  Neck Neck- Supple. No Masses.   Chest and Lung Exam Auscultation: Breath Sounds:-Clear even and unlabored.  Cardiovascular Auscultation:Rythm- Regular, rate and rhythm. Murmurs & Other Heart Sounds:Ausculatation of the heart reveal- No Murmurs.  Lymphatic Head & Neck General Head & Neck Lymphatics: Bilateral: Description- No Localized lymphadenopathy.     Assessment & Plan:  You appear to have bronchitis. Rest hydrate and tylenol for fever. I am prescribing cough medicine benzonatate, and azithromycin antibiotic. For your nasal congestion use your flonase  You should gradually get better. If not then notify us and would recommend a chest xray.  Your rt ear looks early infected. Your st likely from the constant cough but azithromycin has good strep coverage.  Follow up in 7-10 days or as needed

## 2016-12-12 DIAGNOSIS — R110 Nausea: Secondary | ICD-10-CM | POA: Diagnosis not present

## 2016-12-12 DIAGNOSIS — J040 Acute laryngitis: Secondary | ICD-10-CM | POA: Diagnosis not present

## 2016-12-12 DIAGNOSIS — R030 Elevated blood-pressure reading, without diagnosis of hypertension: Secondary | ICD-10-CM | POA: Diagnosis not present

## 2016-12-12 DIAGNOSIS — J029 Acute pharyngitis, unspecified: Secondary | ICD-10-CM | POA: Diagnosis not present

## 2016-12-12 DIAGNOSIS — R070 Pain in throat: Secondary | ICD-10-CM | POA: Diagnosis not present

## 2016-12-12 DIAGNOSIS — Z880 Allergy status to penicillin: Secondary | ICD-10-CM | POA: Diagnosis not present

## 2016-12-16 ENCOUNTER — Telehealth: Payer: Self-pay | Admitting: Physician Assistant

## 2016-12-16 NOTE — Telephone Encounter (Signed)
°  Relation to [id number] Call back number: [phone] Pharmacy: [facility]  Reason for call: pt was seen on 12/11/16, pt states Lauren Case gave her a z-pak and she finished on yesterday, however pt states her throat is still hurting really bad, and she would like to know if he can call her in something else or what should she do.

## 2016-12-16 NOTE — Telephone Encounter (Signed)
Pt needs appointment. Azithromycin has coverage for a lot of bacteria even strep. So would like to see what throat looks like, get rapid strep test and do send out culture. Try to give 8-9 am or 1-2 appointment this week. Avoid late morning or afternoon for pt wait time sake.

## 2016-12-18 ENCOUNTER — Telehealth: Payer: Self-pay | Admitting: Physician Assistant

## 2016-12-18 NOTE — Telephone Encounter (Signed)
Patient calling to see if her rx would be refilled for FLUoxetine (PROZAC) 20 MG tablet. Please call patient at [phone] when decision has been made and when it is ready.  [place] [place] - [address] [phone] (Phone) [phone] (Fax)     Thank you.

## 2016-12-18 NOTE — Telephone Encounter (Signed)
LMOVM advising patient she will need to make an f/u appointment with [facility] for get refills or make an appointment sooner with Percell Miller to get Prozac refilled her choice.

## 2016-12-18 NOTE — Telephone Encounter (Signed)
After speaking with Einar Pheasant, patient is overdue for an appointment. She is transferring to [facility] but NPT appt is not until 03/02/17. Patient will need to make an appt with [facility] for more refills or a sooner appt with Percell Miller for refill of Prozac.

## 2016-12-19 NOTE — Telephone Encounter (Signed)
encounter closed until pt schedules an earlier appt with Percell Miller or returns call.

## 2016-12-22 ENCOUNTER — Ambulatory Visit (INDEPENDENT_AMBULATORY_CARE_PROVIDER_SITE_OTHER): Payer: 59 | Admitting: Medical

## 2016-12-22 ENCOUNTER — Encounter: Payer: Self-pay | Admitting: Medical

## 2016-12-22 VITALS — BP 128/84 | HR 88 | Temp 98.4°F | Resp 18 | Ht 64.0 in | Wt 195.2 lb

## 2016-12-22 DIAGNOSIS — J040 Acute laryngitis: Secondary | ICD-10-CM | POA: Diagnosis not present

## 2016-12-22 DIAGNOSIS — R05 Cough: Secondary | ICD-10-CM | POA: Diagnosis not present

## 2016-12-22 DIAGNOSIS — J029 Acute pharyngitis, unspecified: Secondary | ICD-10-CM

## 2016-12-22 DIAGNOSIS — F429 Obsessive-compulsive disorder, unspecified: Secondary | ICD-10-CM

## 2016-12-22 DIAGNOSIS — R0982 Postnasal drip: Secondary | ICD-10-CM | POA: Diagnosis not present

## 2016-12-22 DIAGNOSIS — R0981 Nasal congestion: Secondary | ICD-10-CM | POA: Diagnosis not present

## 2016-12-22 DIAGNOSIS — R059 Cough, unspecified: Secondary | ICD-10-CM

## 2016-12-22 LAB — POCT RAPID STREP A (OFFICE): RAPID STREP A SCREEN: NEGATIVE

## 2016-12-22 MED ORDER — AZELASTINE HCL 0.1 % NA SOLN: 2.0000 | Freq: Two times a day (BID) | NASAL | 3 refills | Status: DC

## 2016-12-22 MED ORDER — BENZONATATE 100 MG PO CAPS: 100.0000 mg | ORAL_CAPSULE | Freq: Three times a day (TID) | ORAL | 0 refills | Status: DC | PRN

## 2016-12-22 MED ORDER — LEVOCETIRIZINE DIHYDROCHLORIDE 5 MG PO TABS: 5.0000 mg | ORAL_TABLET | Freq: Every evening | ORAL | 3 refills | Status: AC

## 2016-12-22 MED ORDER — FLUOXETINE HCL 20 MG PO TABS: 60.0000 mg | ORAL_TABLET | Freq: Every day | ORAL | 3 refills | Status: DC

## 2016-12-22 MED FILL — FLUoxetine HCL 20 MG TABS: 20 | 30 days supply | Qty: 90 | Fill #0

## 2016-12-22 MED FILL — BENZONATATE 100 MG CAP: 100 | 7 days supply | Qty: 21 | Fill #0

## 2016-12-22 MED FILL — LEVOCETIRIZINE 5 MG TABLET: 5 | 30 days supply | Qty: 30 | Fill #0

## 2016-12-22 MED FILL — AZELASTINE 0.1% (137 MCG) S: 0.1 | 30 days supply | Qty: 30 | Fill #0

## 2016-12-22 NOTE — Patient Instructions (Addendum)
For your nasal congestion continue flonase and rx astelin.  For cough rx benzonatate.  For persisting laryngitis and history of smoking refer to ENT.  Pt post nasal drainage rx xyzal. Hold mucinex.  If you get sinus pressure or chest congestion will rx doxycycline. Notify us if worsening.  Throat culture is pending.   Will rx your prozac for ocd.  Follow up in 10-14 days or as needed

## 2016-12-22 NOTE — Telephone Encounter (Signed)
Pt has been scheduled to see provider.

## 2016-12-22 NOTE — Progress Notes (Signed)
Pre visit review using our clinic review tool, if applicable. No additional management support is needed unless otherwise documented below in the visit note/SLS  

## 2016-12-22 NOTE — Progress Notes (Signed)
Subjective:    Patient ID: Lauren Case, female    DOB: October 08, 1966, 51 y.o.   MRN: [id number]  HPI  Pt in states saw here 2 weeks ago. She had had bronchitis type symptoms and I rx'd and azithromycin. Pt states her throat was hurting a little on prior visit and felt better for short time.  Pt day after I saw her she had feeling of throat feeling swollen and mucous blocking sensation to back of her throat. Pt went to [facility] ED and CT done showing no peritonsillar abscess and  no retropharyngeal abscess. She had mild laryngeal edema consistent with hoarseness on review of ct report review.  Pt is feeling constant pnd still has a very hoarse voice. Pt states now feeling more nasal congestion. Pt is still coughing up mucous but very rarely. Pt is able to sleep.    Pt also needs refill of her prozac. Her mood is stable and she has done well with her OCD.      Review of Systems  Constitutional: Negative for chills, fatigue and fever.  HENT: Positive for congestion, postnasal drip, sore throat and voice change. Negative for ear pain, rhinorrhea, sinus pain, sinus pressure, sneezing and tinnitus.   Respiratory: Positive for cough. Negative for chest tightness, wheezing and stridor.   Cardiovascular: Negative for chest pain and palpitations.  Gastrointestinal: Negative for abdominal pain.  Musculoskeletal: Negative for back pain and myalgias.  Skin: Negative for rash.  Neurological: Negative for dizziness, seizures, weakness and headaches.  Psychiatric/Behavioral: Negative for behavioral problems, dysphoric mood and suicidal ideas. The patient is not nervous/anxious.        Ocd controlled with on prozac.   Past Medical History:  Diagnosis Date  . Chicken pox   . Environmental allergies   . GERD (gastroesophageal reflux disease)   . Measles   . Mumps   . Seasonal allergies      Social History   Social History  . Marital status: Single    Spouse name: N/A  . Number of children:  N/A  . Years of education: N/A   Occupational History  . Not on file.   Social History Main Topics  . Smoking status: Current Every Day Smoker    Packs/day: 1.00    Years: 30.00  . Smokeless tobacco: Never Used  . Alcohol use 6.0 oz/week    10 Shots of liquor per week  . Drug use: No  . Sexual activity: No   Other Topics Concern  . Not on file   Social History Narrative  . No narrative on file    Past Surgical History:  Procedure Laterality Date  . ESSURE TUBAL LIGATION    . TONSILLECTOMY  1984  . WISDOM TOOTH EXTRACTION      Family History  Problem Relation Age of Onset  . Healthy Mother     Living  . Healthy Father     Living  . Diabetes Maternal Grandfather     Borderline  . COPD Maternal Aunt   . COPD Maternal Uncle   . Alzheimer's disease Maternal Grandmother   . Alzheimer's disease Paternal Grandfather   . Healthy Brother     x1  . Allergies Son     x1    Allergies  Allergen Reactions  . Other Anaphylaxis    Truffle Oils  . Penicillins Anaphylaxis    Current Outpatient Prescriptions on File Prior to Visit  Medication Sig Dispense Refill  . atorvastatin (LIPITOR) 10 MG tablet  Take 1 tablet (10 mg total) by mouth daily. 30 tablet 3  . cetirizine (ZYRTEC) 10 MG tablet Take 10 mg by mouth daily.    . fenofibrate 160 MG tablet Take 1 tablet (160 mg total) by mouth daily. 30 tablet 3  . fluticasone (FLONASE) 50 MCG/ACT nasal spray Place 2 sprays into both nostrils daily. 16 g 2  . omeprazole (PRILOSEC) 20 MG capsule Take 1 capsule (20 mg total) by mouth daily. 90 capsule 1  . tiZANidine (ZANAFLEX) 4 MG tablet Take 1 tablet (4 mg total) by mouth at bedtime. (Patient taking differently: Take 4 mg by mouth at bedtime as needed. ) 15 tablet 0  . traZODone (DESYREL) 50 MG tablet Take 0.5 tablets (25 mg total) by mouth at bedtime as needed for sleep. 30 tablet 1   No current facility-administered medications on file prior to visit.     BP 128/84 (BP  Location: Right Arm, Patient Position: Sitting, Cuff Size: Large)   Pulse 88   Temp 98.4 F (36.9 C) (Oral)   Resp 18   Ht 5\' 4"  (1.626 m)   Wt 195 lb 4 oz (88.6 kg)   SpO2 98%   BMI 33.51 kg/m       Objective:   Physical Exam   General  Mental Status - Alert. General Appearance - Well groomed. Not in acute distress. But very hoarse voice.  Skin Rashes- No Rashes.  HEENT Head- Normal. Ear Auditory Canal - Left- Normal. Right - Normal.Tympanic Membrane- Left- Normal. Right- Normal. Eye Sclera/Conjunctiva- Left- Normal. Right- Normal. Nose & Sinuses Nasal Mucosa- Left-  Boggy and Congested. Right-  Boggy and  Congested.Bilateral  No maxillary and  No frontal sinus pressure. Mouth & Throat Lips: Upper Lip- Normal: no dryness, cracking, pallor, cyanosis, or vesicular eruption. Lower Lip-Normal: no dryness, cracking, pallor, cyanosis or vesicular eruption. Buccal Mucosa- Bilateral- No Aphthous ulcers. Oropharynx- No Discharge or Erythema. Tonsils: Characteristics- Bilateral- No Erythema or Congestion. Size/Enlargement- Bilateral- No enlargement. Discharge- bilateral-None.  Neck Neck- Supple. No Masses.   Chest and Lung Exam Auscultation: Breath Sounds:-Clear even and unlabored.  Cardiovascular Auscultation:Rythm- Regular, rate and rhythm. Murmurs & Other Heart Sounds:Ausculatation of the heart reveal- No Murmurs.  Lymphatic Head & Neck General Head & Neck Lymphatics: Bilateral: Description- No Localized lymphadenopathy.      Assessment & Plan:  For your nasal congestion continue flonase and rx astelin.  For cough rx benzonatate.  For persisting laryngitis and history of smoking refer to ENT.  Pt post nasal drainage rx xyzal. Hold mucinex.  If you get sinus pressure or chest congestion will rx doxycycline. Notify us if worsening.   Throat culture is pending.   Will rx your prozac for ocd.  Follow up in 10-14 days or as needed

## 2016-12-24 LAB — CULTURE, GROUP A STREP

## 2016-12-26 ENCOUNTER — Other Ambulatory Visit: Payer: Self-pay | Admitting: Emergency Medicine

## 2016-12-26 DIAGNOSIS — K219 Gastro-esophageal reflux disease without esophagitis: Secondary | ICD-10-CM

## 2016-12-29 MED ORDER — OMEPRAZOLE 20 MG PO CPDR: 20.0000 mg | DELAYED_RELEASE_CAPSULE | Freq: Every day | ORAL | 1 refills | Status: DC

## 2016-12-29 NOTE — Telephone Encounter (Signed)
Rx request to pharmacy/SLS  

## 2017-01-19 MED FILL — FLUoxetine HCL 20 MG TABS: 20 | 30 days supply | Qty: 90 | Fill #1

## 2017-02-19 MED FILL — FLUoxetine HCL 20 MG TABS: 20 | 30 days supply | Qty: 90 | Fill #2

## 2017-02-27 ENCOUNTER — Telehealth: Payer: Self-pay

## 2017-02-27 NOTE — Telephone Encounter (Signed)
Pre-Viit call completed

## 2017-03-02 ENCOUNTER — Ambulatory Visit (INDEPENDENT_AMBULATORY_CARE_PROVIDER_SITE_OTHER): Payer: 59 | Admitting: Medical

## 2017-03-02 ENCOUNTER — Encounter: Payer: Self-pay | Admitting: Medical

## 2017-03-02 VITALS — BP 149/88 | HR 74 | Temp 98.3°F | Resp 16 | Ht 64.0 in | Wt 193.2 lb

## 2017-03-02 DIAGNOSIS — F17200 Nicotine dependence, unspecified, uncomplicated: Secondary | ICD-10-CM | POA: Diagnosis not present

## 2017-03-02 DIAGNOSIS — I10 Essential (primary) hypertension: Secondary | ICD-10-CM

## 2017-03-02 DIAGNOSIS — J040 Acute laryngitis: Secondary | ICD-10-CM | POA: Diagnosis not present

## 2017-03-02 DIAGNOSIS — F429 Obsessive-compulsive disorder, unspecified: Secondary | ICD-10-CM | POA: Diagnosis not present

## 2017-03-02 DIAGNOSIS — E785 Hyperlipidemia, unspecified: Secondary | ICD-10-CM | POA: Diagnosis not present

## 2017-03-02 DIAGNOSIS — J301 Allergic rhinitis due to pollen: Secondary | ICD-10-CM | POA: Diagnosis not present

## 2017-03-02 DIAGNOSIS — K219 Gastro-esophageal reflux disease without esophagitis: Secondary | ICD-10-CM

## 2017-03-02 MED ORDER — BUPROPION HCL ER (XL) 150 MG PO TB24: 150.0000 mg | ORAL_TABLET | Freq: Every day | ORAL | 0 refills | Status: DC

## 2017-03-02 MED ORDER — BUPROPION HCL ER (SR) 150 MG PO TB12: 150.0000 mg | ORAL_TABLET | Freq: Every day | ORAL | 1 refills | Status: DC

## 2017-03-02 MED ORDER — LOSARTAN POTASSIUM 100 MG PO TABS: 100.0000 mg | ORAL_TABLET | Freq: Every day | ORAL | 3 refills | Status: DC

## 2017-03-02 NOTE — Progress Notes (Signed)
Subjective:    Patient ID: Lauren Case, female    DOB: 12/02/1965, 51 y.o.   MRN: [id number]  HPI   Pt for transfer of care.  Pt states her last visit nasal congestion cleared up. She is convinced was related to allergies. She got better with xyzal. Sinus infection on last visit resolved with antibiotic.  She states her laryngitis did get better but still presentt. Pt aware if constant I did want to refer to ENT. She has their number. Pt declined appointment due to $70 copay but she states may go. ENT office called her.   Jerrye Bushy- controlled with omeprazole.  OCD controlled with prozac.(anxiety and depression controlled as well)   Pt bp little high today. Pt checks her bp occasionally at pharmacy and is high.  Pt has high cholesterol-  She has not been on her lipitor. Pt states her legs felt heavy while on medication. Pt has not been on fish oil. Pt admits eating not eating healthy.  Pt does smoke- about 3/4 of pack a day.   Review of Systems  Constitutional: Negative for chills, fatigue and fever.  HENT: Negative for congestion, ear discharge and ear pain.        Residual/some better hoarse voice worsens when gets sick or talks more  Respiratory: Negative for cough, chest tightness, shortness of breath and wheezing.   Cardiovascular: Negative for chest pain and palpitations.  Gastrointestinal: Negative for abdominal pain.  Genitourinary: Negative for difficulty urinating, dysuria, enuresis, flank pain, frequency and genital sores.  Musculoskeletal: Negative for back pain.  Skin: Negative for rash.  Neurological: Negative for dizziness, light-headedness, numbness and headaches.  Hematological: Negative for adenopathy. Does not bruise/bleed easily.  Psychiatric/Behavioral: Negative for behavioral problems and confusion. The patient is not nervous/anxious.     Past Medical History:  Diagnosis Date  . Chicken pox   . Environmental allergies   . GERD (gastroesophageal  reflux disease)   . Measles   . Mumps   . Seasonal allergies      Social History   Social History  . Marital status: Single    Spouse name: N/A  . Number of children: N/A  . Years of education: N/A   Occupational History  . Not on file.   Social History Main Topics  . Smoking status: Current Every Day Smoker    Packs/day: 1.00    Years: 30.00  . Smokeless tobacco: Never Used  . Alcohol use 6.0 oz/week    10 Shots of liquor per week  . Drug use: No  . Sexual activity: No   Other Topics Concern  . Not on file   Social History Narrative  . No narrative on file    Past Surgical History:  Procedure Laterality Date  . ESSURE TUBAL LIGATION    . TONSILLECTOMY  1984  . WISDOM TOOTH EXTRACTION      Family History  Problem Relation Age of Onset  . Healthy Mother     Living  . Healthy Father     Living  . Diabetes Maternal Grandfather     Borderline  . COPD Maternal Aunt   . COPD Maternal Uncle   . Alzheimer's disease Maternal Grandmother   . Alzheimer's disease Paternal Grandfather   . Healthy Brother     x1  . Allergies Son     x1    Allergies  Allergen Reactions  . Other Anaphylaxis    Truffle Oils  . Penicillins Anaphylaxis    Current  Outpatient Prescriptions on File Prior to Visit  Medication Sig Dispense Refill  . atorvastatin (LIPITOR) 10 MG tablet Take 1 tablet (10 mg total) by mouth daily. 30 tablet 3  . azelastine (ASTELIN) 0.1 % nasal spray Place 2 sprays into both nostrils 2 (two) times daily. Use in each nostril as directed 30 mL 3  . cetirizine (ZYRTEC) 10 MG tablet Take 10 mg by mouth daily.    Marland Kitchen FLUoxetine (PROZAC) 20 MG tablet Take 3 tablets (60 mg total) by mouth daily. 90 tablet 3  . fluticasone (FLONASE) 50 MCG/ACT nasal spray Place 2 sprays into both nostrils daily. 16 g 2  . levocetirizine (XYZAL) 5 MG tablet Take 1 tablet (5 mg total) by mouth every evening. 30 tablet 3  . omeprazole (PRILOSEC) 20 MG capsule Take 1 capsule (20 mg  total) by mouth daily. 90 capsule 1  . tiZANidine (ZANAFLEX) 4 MG tablet Take 1 tablet (4 mg total) by mouth at bedtime. 15 tablet 0  . traZODone (DESYREL) 50 MG tablet Take 0.5 tablets (25 mg total) by mouth at bedtime as needed for sleep. 30 tablet 1   No current facility-administered medications on file prior to visit.     BP (!) 150/85 (BP Location: Right Arm, Patient Position: Sitting, Cuff Size: Normal)   Pulse 74   Temp 98.3 F (36.8 C) (Oral)   Resp 16   Ht 5\' 4"  (1.626 m)   Wt 193 lb 3.2 oz (87.6 kg)   SpO2 98%   BMI 33.16 kg/m       Objective:   Physical Exam  General  Mental Status - Alert. General Appearance - Well groomed. Not in acute distress.  Skin Rashes- No Rashes.  HEENT Head- Normal. Ear Auditory Canal - Left- Normal. Right - Normal.Tympanic Membrane- Left- Normal. Right- Normal. Eye Sclera/Conjunctiva- Left- Normal. Right- Normal. Nose & Sinuses Nasal Mucosa- Left-  Boggy and Congested. Right-  Boggy and  Congested.Bilateral maxillary and frontal sinus pressure. Mouth & Throat Lips: Upper Lip- Normal: no dryness, cracking, pallor, cyanosis, or vesicular eruption. Lower Lip-Normal: no dryness, cracking, pallor, cyanosis or vesicular eruption. Buccal Mucosa- Bilateral- No Aphthous ulcers. Oropharynx- No Discharge or Erythema. Tonsils: Characteristics- Bilateral- No Erythema or Congestion. Size/Enlargement- Bilateral- No enlargement. Discharge- bilateral-None.  Neck Neck- Supple. No Masses.   Chest and Lung Exam Auscultation: Breath Sounds:-Clear even and unlabored.  Cardiovascular Auscultation:Rythm- Regular, rate and rhythm. Murmurs & Other Heart Sounds:Ausculatation of the heart reveal- No Murmurs.  Lymphatic Head & Neck General Head & Neck Lymphatics: Bilateral: Description- No Localized lymphadenopathy.    Neurologic Cranial Nerve exam:- CN III-XII intact(No nystagmus), symmetric smile. Strength:- 5/5 equal and symmetric strength  both upper and lower extremities.      Assessment & Plan:  For allergies continue xyzal.  Regarding laryngitis still recommend ENT when you get copay.   For Gerd controlled continue prilosec.  For ocd, depression, and anxiety well controlled on prozac please continue.  For htn rx losartan 100 mg tab. 1 tab a day. Check bp in office schedule nurse bp check in 2 weeks.  For high cholesterol diet, exercise and fish oil otc.  For smoking cessation rx wellbutrin.  Follow up in 4 months early am. Come in fasting.

## 2017-03-02 NOTE — Patient Instructions (Signed)
For allergies continue xyzal.  Regarding laryngitis still recommend ENT when you get copay.   For Gerd controlled continue prilosec.  For ocd, depression, and anxiety well controlled on prozac please continue.  For htn rx losartan 100 mg tab. 1 tab a day. Check bp in office schedule nurse bp check in 2 weeks.  For high cholesterol diet, exercise and fish oil otc.  For smoking cessation rx wellbutrin.  Follow up in 4 months early am. Come in fasting.

## 2017-03-02 NOTE — Progress Notes (Signed)
Pre visit review using our clinic review tool, if applicable. No additional management support is needed unless otherwise documented below in the visit note. 

## 2017-03-17 ENCOUNTER — Other Ambulatory Visit: Payer: Self-pay | Admitting: Physician Assistant

## 2017-03-18 NOTE — Telephone Encounter (Signed)
I can refill her 90 tabs of depression med but need to see her in 3 months. Need you to ask her and verify that her mood is controlled/stable. For all 90 day rx of depression meds would you call and get answer to this even before sending me the 90 day refill request(in future). Document how she feels and please send me note explaining how she feels. Thanks.

## 2017-03-19 NOTE — Telephone Encounter (Signed)
Pt state she take medication for Obsessive compulsive disorder her mood is fine. Pt states she need 90 pills because she take 3 a day and thats the only way insurance will cover it . Pt was just in for CPE on 03/02/17 and has follow up appointment for 06/29/17.

## 2017-04-13 ENCOUNTER — Telehealth: Payer: Self-pay | Admitting: Medical

## 2017-04-13 MED ORDER — FLUOXETINE HCL 20 MG PO TABS: 60.0000 mg | ORAL_TABLET | Freq: Every day | ORAL | 0 refills | Status: DC

## 2017-04-13 NOTE — Telephone Encounter (Addendum)
Pt states pharm states Prozac denied by provider. Pt states she takes 3 pills per day to equal her dosage and that is why it is written for #90.  It is not written that way for a ninety day supply. Pt states this issue is addressed each time it needs to be refilled. Please review her records and allow refill of Prozac 20 mg three times daily.

## 2017-04-13 NOTE — Telephone Encounter (Signed)
RX sent to pharmacy  

## 2017-04-21 MED ORDER — BUPROPION HCL ER (XL) 150 MG PO TB24: 150.0000 mg | ORAL_TABLET | Freq: Every day | ORAL | 0 refills | Status: DC

## 2017-04-21 NOTE — Telephone Encounter (Signed)
Rx send to pharmacy  

## 2017-04-21 NOTE — Addendum Note (Signed)
Addended by: Hinton Dyer on: 04/21/2017 11:15 AM   Modules accepted: Orders

## 2017-04-21 NOTE — Telephone Encounter (Signed)
Pt now calling stating Wellbutrin xl 150mg  24 hr tablet was denied. Pt states she was put on this medication 03/02/17 by Percell Miller to help her quit smoking and she doesn't know why it was denied. Please send in RX for her.  [facility] [place], [place] - [address]  Pt is out of medication now.

## 2017-05-19 ENCOUNTER — Other Ambulatory Visit: Payer: Self-pay | Admitting: Medical

## 2017-05-19 ENCOUNTER — Telehealth: Payer: Self-pay | Admitting: Medical

## 2017-05-19 NOTE — Telephone Encounter (Signed)
Caller name:Davanna Ide Relationship to patient:[phone] Can be reached: Pharmacy:[facility]  Reason for call:Requesitng refill on Prozac 20mg  3 tabs 1x daily. Requesting 2 additional refills so she doesn't have to call every month

## 2017-05-20 ENCOUNTER — Telehealth: Payer: Self-pay | Admitting: Medical

## 2017-05-20 NOTE — Telephone Encounter (Signed)
Okay to give refill 

## 2017-05-20 NOTE — Telephone Encounter (Signed)
Yes you can refill prozac. But before you do verify that her mood is well and document in chart. If all well can give her 3 refills. Ask to follow up late October or November.

## 2017-05-21 MED ORDER — FLUOXETINE HCL 20 MG PO TABS: 60.0000 mg | ORAL_TABLET | Freq: Every day | ORAL | 3 refills | Status: DC

## 2017-05-21 NOTE — Addendum Note (Signed)
Addended by: Hinton Dyer on: 05/21/2017 09:43 AM   Modules accepted: Orders

## 2017-05-21 NOTE — Telephone Encounter (Signed)
Obsessive compulsive disorder her mood is fine.

## 2017-05-21 NOTE — Telephone Encounter (Signed)
Rx sent to pharmacy   

## 2017-06-15 ENCOUNTER — Telehealth: Payer: Self-pay | Admitting: Medical

## 2017-06-16 NOTE — Telephone Encounter (Signed)
Lauren Case I didn't get a message, is there something that I need to do for this pt? The message was routed to me

## 2017-06-18 NOTE — Telephone Encounter (Signed)
No it was an error

## 2017-06-29 ENCOUNTER — Telehealth: Payer: Self-pay | Admitting: Medical

## 2017-06-29 ENCOUNTER — Ambulatory Visit: Payer: 59 | Admitting: Medical

## 2017-06-29 ENCOUNTER — Ambulatory Visit (INDEPENDENT_AMBULATORY_CARE_PROVIDER_SITE_OTHER): Payer: 59 | Admitting: Medical

## 2017-06-29 ENCOUNTER — Encounter: Payer: Self-pay | Admitting: Medical

## 2017-06-29 VITALS — BP 132/82 | HR 98 | Temp 98.4°F | Resp 19 | Ht 64.0 in | Wt 198.8 lb

## 2017-06-29 DIAGNOSIS — E785 Hyperlipidemia, unspecified: Secondary | ICD-10-CM

## 2017-06-29 DIAGNOSIS — I10 Essential (primary) hypertension: Secondary | ICD-10-CM | POA: Diagnosis not present

## 2017-06-29 DIAGNOSIS — F17200 Nicotine dependence, unspecified, uncomplicated: Secondary | ICD-10-CM | POA: Diagnosis not present

## 2017-06-29 LAB — COMPREHENSIVE METABOLIC PANEL
ALBUMIN: 3.9 g/dL (ref 3.5–5.2)
ALK PHOS: 91 U/L (ref 39–117)
ALT: 51 U/L — AB (ref 0–35)
AST: 87 U/L — ABNORMAL HIGH (ref 0–37)
BILIRUBIN TOTAL: 0.3 mg/dL (ref 0.2–1.2)
BUN: 8 mg/dL (ref 6–23)
CO2: 31 mEq/L (ref 19–32)
Calcium: 9.2 mg/dL (ref 8.4–10.5)
Chloride: 102 mEq/L (ref 96–112)
Creatinine, Ser: 0.69 mg/dL (ref 0.40–1.20)
GFR: 94.36 mL/min (ref >60.00)
GLUCOSE: 75 mg/dL (ref 70–99)
Potassium: 3.2 mEq/L — ABNORMAL LOW (ref 3.5–5.1)
SODIUM: 141 meq/L (ref 135–145)
TOTAL PROTEIN: 6.8 g/dL (ref 6.0–8.3)

## 2017-06-29 LAB — LIPID PANEL
CHOLESTEROL: 223 mg/dL — AB (ref 0–200)
HDL: 33.8 mg/dL — ABNORMAL LOW (ref >39.00)
NonHDL: 189.23
TRIGLYCERIDES: 228 mg/dL — AB (ref 0.0–149.0)
Total CHOL/HDL Ratio: 7
VLDL: 45.6 mg/dL — ABNORMAL HIGH (ref 0.0–40.0)

## 2017-06-29 LAB — LDL CHOLESTEROL, DIRECT: Direct LDL: 158 mg/dL

## 2017-06-29 MED ORDER — ROSUVASTATIN CALCIUM 10 MG PO TABS: 10.0000 mg | ORAL_TABLET | Freq: Every day | ORAL | 3 refills | Status: DC

## 2017-06-29 NOTE — Telephone Encounter (Signed)
Rx Crestor sent to the pharmacy

## 2017-06-29 NOTE — Patient Instructions (Addendum)
For your smoking cessation efforts, continue Wellbutrin. You have already decreased by 6 cigarettes a day and I think over the next 3-4 months you should be able to taper off completely.  Your blood pressure is controlled today and pretty good levels at work as well. Continue losartan.  For history of high cholesterol, I recommend low cholesterol diet and daily exercise. We'll repeat your cholesterol level today and  determine if you need to get back on medications as you have been on before.  Flu vaccine in October with your work and you'll get mammogram through your work as well.   Follow-up date to be determined after lab review. Most likely 3-6 months or as needed.

## 2017-06-29 NOTE — Progress Notes (Signed)
Subjective:    Patient ID: Lauren Case, female    DOB: 04-Mar-1966, 51 y.o.   MRN: [id number]  HPI  Pt updates me that she has decreased cigarettes by 5. She states Wellbutrin takes urge to smoke. So total cigarettes per day now 12 cigarettes.  Pt is fasting today. Pt in past was advised to restart fenofibrate and lipitor. Former pt of [place].  Pt bp is good good today. Pt checks her bp at work and usually close to 140/85. But this as it work. But is with machine also. No cardiac or neurologic signs or symptoms.     Review of Systems  Constitutional: Negative for appetite change, chills, diaphoresis, fatigue and fever.  HENT: Negative for congestion, ear pain and facial swelling.   Respiratory: Negative for cough, choking, chest tightness, shortness of breath and wheezing.   Cardiovascular: Negative for chest pain and palpitations.  Gastrointestinal: Negative for abdominal pain, constipation and diarrhea.  Musculoskeletal: Negative for back pain.  Neurological: Negative for dizziness, seizures, speech difficulty, weakness and headaches.  Hematological: Negative for adenopathy. Does not bruise/bleed easily.  Psychiatric/Behavioral: Negative for behavioral problems, confusion, self-injury and suicidal ideas.   Past Medical History:  Diagnosis Date  . Chicken pox   . Environmental allergies   . GERD (gastroesophageal reflux disease)   . Measles   . Mumps   . Seasonal allergies      Social History   Social History  . Marital status: Single    Spouse name: N/A  . Number of children: N/A  . Years of education: N/A   Occupational History  . Not on file.   Social History Main Topics  . Smoking status: Current Every Day Smoker    Packs/day: 1.00    Years: 30.00  . Smokeless tobacco: Never Used  . Alcohol use 6.0 oz/week    10 Shots of liquor per week  . Drug use: No  . Sexual activity: No   Other Topics Concern  . Not on file   Social History Narrative  . No  narrative on file    Past Surgical History:  Procedure Laterality Date  . ESSURE TUBAL LIGATION    . TONSILLECTOMY  1984  . WISDOM TOOTH EXTRACTION      Family History  Problem Relation Age of Onset  . Healthy Mother        Living  . Healthy Father        Living  . Diabetes Maternal Grandfather        Borderline  . COPD Maternal Aunt   . COPD Maternal Uncle   . Alzheimer's disease Maternal Grandmother   . Alzheimer's disease Paternal Grandfather   . Healthy Brother        x1  . Allergies Son        x1    Allergies  Allergen Reactions  . Other Anaphylaxis    Truffle Oils  . Penicillins Anaphylaxis    Current Outpatient Prescriptions on File Prior to Visit  Medication Sig Dispense Refill  . azelastine (ASTELIN) 0.1 % nasal spray Place 2 sprays into both nostrils 2 (two) times daily. Use in each nostril as directed 30 mL 3  . buPROPion (WELLBUTRIN XL) 150 MG 24 hr tablet TAKE 1 TABLET BY MOUTH EVERY DAY 30 tablet 0  . cetirizine (ZYRTEC) 10 MG tablet Take 10 mg by mouth daily.    Marland Kitchen FLUoxetine (PROZAC) 20 MG tablet Take 3 tablets (60 mg total) by mouth daily. [place]  tablet 3  . fluticasone (FLONASE) 50 MCG/ACT nasal spray Place 2 sprays into both nostrils daily. 16 g 2  . levocetirizine (XYZAL) 5 MG tablet Take 1 tablet (5 mg total) by mouth every evening. 30 tablet 3  . losartan (COZAAR) 100 MG tablet TAKE 1 TABLET BY MOUTH EVERY DAY 30 tablet 0  . omeprazole (PRILOSEC) 20 MG capsule Take 1 capsule (20 mg total) by mouth daily. 90 capsule 1  . tiZANidine (ZANAFLEX) 4 MG tablet Take 1 tablet (4 mg total) by mouth at bedtime. 15 tablet 0  . traZODone (DESYREL) 50 MG tablet Take 0.5 tablets (25 mg total) by mouth at bedtime as needed for sleep. 30 tablet 1   No current facility-administered medications on file prior to visit.     BP 132/82 (BP Location: Left Arm, Patient Position: Sitting, Cuff Size: Normal)   Pulse 98   Temp 98.4 F (36.9 C) (Oral)   Resp 19   Ht 5\' 4"   (1.626 m)   Wt 198 lb 12.8 oz (90.2 kg)   SpO2 96%   BMI 34.12 kg/m       Objective:   Physical Exam  General Mental Status- Alert. General Appearance- Not in acute distress.   Skin General: Color- Normal Color. Moisture- Normal Moisture.  Neck Carotid Arteries- Normal color. Moisture- Normal Moisture. No carotid bruits. No JVD.  Chest and Lung Exam Auscultation: Breath Sounds:-Normal.  Cardiovascular Auscultation:Rythm- Regular. Murmurs & Other Heart Sounds:Auscultation of the heart reveals- No Murmurs.    Neurologic Cranial Nerve exam:- CN III-XII intact(No nystagmus), symmetric smile. Strength:- 5/5 equal and symmetric strength both upper and lower extremities.      Assessment & Plan:  For your smoking cessation efforts, continue Wellbutrin. You have already decreased by 6 cigarettes a day and I think over the next 3-4 months you should be able to taper off completely.  Your blood pressure is controlled today and pretty good levels at work as well. Continue losartan.  For history of high cholesterol, I recommend low cholesterol diet and daily exercise. We'll repeat your cholesterol level today and  determine if you need to get back on medications as you have been on before.  Flu vaccine in October with your work and you'll get mammogram through your work as well.   Follow-up date to be determined after lab review. Most likely 3-6 months or as needed.   Xai Frerking, Percell Miller, PA-C

## 2017-07-14 ENCOUNTER — Other Ambulatory Visit: Payer: Self-pay | Admitting: Medical

## 2017-07-14 DIAGNOSIS — K219 Gastro-esophageal reflux disease without esophagitis: Secondary | ICD-10-CM

## 2017-07-29 ENCOUNTER — Other Ambulatory Visit: Payer: Self-pay | Admitting: Medical

## 2017-07-29 DIAGNOSIS — Z1231 Encounter for screening mammogram for malignant neoplasm of breast: Secondary | ICD-10-CM

## 2017-08-04 ENCOUNTER — Ambulatory Visit (HOSPITAL_BASED_OUTPATIENT_CLINIC_OR_DEPARTMENT_OTHER)
Admission: RE | Admit: 2017-08-04 | Discharge: 2017-08-04 | Disposition: A | Discharge status: Home / Self Care | Payer: 59 | Source: Ambulatory Visit | Attending: Medical | Admitting: Medical

## 2017-08-04 DIAGNOSIS — Z1231 Encounter for screening mammogram for malignant neoplasm of breast: Secondary | ICD-10-CM | POA: Diagnosis not present

## 2017-08-11 ENCOUNTER — Other Ambulatory Visit: Payer: Self-pay | Admitting: Medical

## 2017-09-03 ENCOUNTER — Ambulatory Visit: Payer: Self-pay | Admitting: Medical

## 2017-09-15 ENCOUNTER — Encounter: Payer: Self-pay | Admitting: Medical

## 2017-09-15 ENCOUNTER — Ambulatory Visit: Payer: 59 | Admitting: Medical

## 2017-09-15 ENCOUNTER — Ambulatory Visit (HOSPITAL_BASED_OUTPATIENT_CLINIC_OR_DEPARTMENT_OTHER)
Admission: RE | Admit: 2017-09-15 | Discharge: 2017-09-15 | Disposition: A | Discharge status: Home / Self Care | Payer: 59 | Source: Ambulatory Visit | Attending: Medical | Admitting: Medical

## 2017-09-15 VITALS — BP 140/90 | HR 71 | Temp 98.4°F | Resp 16 | Ht 64.0 in | Wt 203.2 lb

## 2017-09-15 DIAGNOSIS — F17200 Nicotine dependence, unspecified, uncomplicated: Secondary | ICD-10-CM | POA: Diagnosis not present

## 2017-09-15 DIAGNOSIS — I10 Essential (primary) hypertension: Secondary | ICD-10-CM

## 2017-09-15 DIAGNOSIS — R161 Splenomegaly, not elsewhere classified: Secondary | ICD-10-CM | POA: Insufficient documentation

## 2017-09-15 DIAGNOSIS — R748 Abnormal levels of other serum enzymes: Secondary | ICD-10-CM

## 2017-09-15 DIAGNOSIS — E785 Hyperlipidemia, unspecified: Secondary | ICD-10-CM

## 2017-09-15 LAB — COMPREHENSIVE METABOLIC PANEL
ALK PHOS: 84 U/L (ref 39–117)
ALT: 38 U/L — AB (ref 0–35)
AST: 109 U/L — ABNORMAL HIGH (ref 0–37)
Albumin: 3.9 g/dL (ref 3.5–5.2)
BILIRUBIN TOTAL: 0.3 mg/dL (ref 0.2–1.2)
BUN: 7 mg/dL (ref 6–23)
CALCIUM: 8.9 mg/dL (ref 8.4–10.5)
CO2: 32 mEq/L (ref 19–32)
Chloride: 100 mEq/L (ref 96–112)
Creatinine, Ser: 0.69 mg/dL (ref 0.40–1.20)
GFR: 94.28 mL/min (ref >60.00)
GLUCOSE: 99 mg/dL (ref 70–99)
POTASSIUM: 3.6 meq/L (ref 3.5–5.1)
Sodium: 140 mEq/L (ref 135–145)
TOTAL PROTEIN: 6.7 g/dL (ref 6.0–8.3)

## 2017-09-15 LAB — LIPID PANEL
Cholesterol: 238 mg/dL — ABNORMAL HIGH (ref 0–200)
HDL: 64.8 mg/dL (ref >39.00)
LDL Cholesterol: 142 mg/dL — ABNORMAL HIGH (ref 0–99)
NonHDL: 173.55
TRIGLYCERIDES: 159 mg/dL — AB (ref 0.0–149.0)
Total CHOL/HDL Ratio: 4
VLDL: 31.8 mg/dL (ref 0.0–40.0)

## 2017-09-15 NOTE — Patient Instructions (Addendum)
Your blood pressure is borderline today.  I do think it would be beneficial for you to get a blood pressure cuff at the pharmacy.  Check your blood pressure 3 times a week or if you feel symptomatic.  I want to see your blood pressure trend less than 140/90.  If blood pressure is over 140/90 on average then to make adjustments.  Also I want you to call your pharmacy and ask if there is a recall on your losartan.  Received information regarding one lot of losartan on recall and I want to make sure for years is not on recall.  Good job on your smoking cessation efforts.  Continue to try to taper down off of cigarettes completely.  For your liver enzyme elevation, we will repeat metabolic panel today and I placed an order for you to get abdomen ultrasound.  Please go downstairs today and see when they can get you scheduled for that ultrasound.  For your high cholesterol will get fasting lipid panel today to assess your numbers.  After review of lipid panel will make decision if dose adjustment needs to be done.  Follow-up date to be determined after lab review.

## 2017-09-15 NOTE — Progress Notes (Signed)
Subjective:    Patient ID: Lauren Case, female    DOB: 08/13/1966, 51 y.o.   MRN: [id number]  HPI  Pt in for recheck of cholesterol and mild liver enzyme elevation. She has no reported side effects from crestor.   Pt is eating healthier overall and exercising daily/walking.  Pt has no GI complaints. No ruq pain.  Pt also has decreased her smoking from one pack a day to just 7 cigarettes. Pt state wellbutrin has helped and mood better(but was not depressed). Feel better concentration thought no ADD.    Review of Systems  Constitutional: Negative for chills, fatigue and fever.  Respiratory: Negative for cough, chest tightness, shortness of breath and wheezing.   Cardiovascular: Negative for chest pain and palpitations.  Gastrointestinal: Negative for abdominal distention, abdominal pain, constipation, diarrhea, nausea and vomiting.  Musculoskeletal: Negative for back pain.  Skin: Negative for rash.  Neurological: Negative for dizziness and headaches.  Hematological: Negative for adenopathy. Does not bruise/bleed easily.  Psychiatric/Behavioral: Negative for behavioral problems, decreased concentration, dysphoric mood and sleep disturbance.   Past Medical History:  Diagnosis Date  . Chicken pox   . Environmental allergies   . GERD (gastroesophageal reflux disease)   . Measles   . Mumps   . Seasonal allergies      Social History   Socioeconomic History  . Marital status: Single    Spouse name: Not on file  . Number of children: Not on file  . Years of education: Not on file  . Highest education level: Not on file  Social Needs  . Financial resource strain: Not on file  . Food insecurity - worry: Not on file  . Food insecurity - inability: Not on file  . Transportation needs - medical: Not on file  . Transportation needs - non-medical: Not on file  Occupational History  . Not on file  Tobacco Use  . Smoking status: Current Every Day Smoker    Packs/day: 1.00     Years: 30.00    Pack years: 30.00  . Smokeless tobacco: Never Used  Substance and Sexual Activity  . Alcohol use: Yes    Alcohol/week: 6.0 oz    Types: 10 Shots of liquor per week  . Drug use: No  . Sexual activity: No    Birth control/protection: Surgical  Other Topics Concern  . Not on file  Social History Narrative  . Not on file    Past Surgical History:  Procedure Laterality Date  . ESSURE TUBAL LIGATION    . TONSILLECTOMY  1984  . WISDOM TOOTH EXTRACTION      Family History  Problem Relation Age of Onset  . Healthy Mother        Living  . Healthy Father        Living  . Diabetes Maternal Grandfather        Borderline  . COPD Maternal Aunt   . COPD Maternal Uncle   . Alzheimer's disease Maternal Grandmother   . Alzheimer's disease Paternal Grandfather   . Healthy Brother        x1  . Allergies Son        x1    Allergies  Allergen Reactions  . Other Anaphylaxis    Truffle Oils  . Penicillins Anaphylaxis    Current Outpatient Medications on File Prior to Visit  Medication Sig Dispense Refill  . azelastine (ASTELIN) 0.1 % nasal spray Place 2 sprays into both nostrils 2 (two) times daily. Use  in each nostril as directed 30 mL 3  . buPROPion (WELLBUTRIN XL) 150 MG 24 hr tablet TAKE 1 TABLET BY MOUTH EVERY DAY 30 tablet 2  . cetirizine (ZYRTEC) 10 MG tablet Take 10 mg by mouth daily.    Marland Kitchen FLUoxetine (PROZAC) 20 MG capsule TAKE 3 CAPSULES BY MOUTH DAILY 90 capsule 2  . fluticasone (FLONASE) 50 MCG/ACT nasal spray Place 2 sprays into both nostrils daily. 16 g 2  . levocetirizine (XYZAL) 5 MG tablet Take 1 tablet (5 mg total) by mouth every evening. 30 tablet 3  . losartan (COZAAR) 100 MG tablet TAKE 1 TABLET BY MOUTH EVERY DAY 30 tablet 2  . omeprazole (PRILOSEC) 20 MG capsule TAKE 1 CAPSULE BY MOUTH DAILY 90 capsule 1  . rosuvastatin (CRESTOR) 10 MG tablet Take 1 tablet (10 mg total) by mouth daily. 30 tablet 3  . tiZANidine (ZANAFLEX) 4 MG tablet Take 1  tablet (4 mg total) by mouth at bedtime. 15 tablet 0  . traZODone (DESYREL) 50 MG tablet Take 0.5 tablets (25 mg total) by mouth at bedtime as needed for sleep. 30 tablet 1   No current facility-administered medications on file prior to visit.     BP (!) 158/94   Pulse 71   Temp 98.4 F (36.9 C) (Oral)   Resp 16   Ht 5\' 4"  (1.626 m)   Wt 203 lb 3.2 oz (92.2 kg)   SpO2 98%   BMI 34.88 kg/m       Objective:   Physical Exam  General Mental Status- Alert. General Appearance- Not in acute distress.   Skin General: Color- Normal Color. Moisture- Normal Moisture.  Neck Carotid Arteries- Normal color. Moisture- Normal Moisture. No carotid bruits. No JVD.  Chest and Lung Exam Auscultation: Breath Sounds:-Normal.  Cardiovascular Auscultation:Rythm- Regular. Murmurs & Other Heart Sounds:Auscultation of the heart reveals- No Murmurs.  Abdomen Inspection:-Inspeection Normal. Palpation/Percussion:Note:No mass. Palpation and Percussion of the abdomen reveal- Non Tender, Non Distended + BS, no rebound or guarding.   Neurologic Cranial Nerve exam:- CN III-XII intact(No nystagmus), symmetric smile. Strength:- 5/5 equal and symmetric strength both upper and lower extremities.      Assessment & Plan:  Your blood pressure is borderline today.  I do think it would be beneficial for you to get a blood pressure cuff at the pharmacy.  Check your blood pressure 3 times a week or if you feel symptomatic.  I want to see your blood pressure trend less than 140/90.  If blood pressure is over 140/90 on average then to make adjustments.  Also I want you to call your pharmacy and ask if there is a recall on your losartan.  Received information regarding one lot of losartan has recall and I want to make sure for years is not on recall.  Good job on your smoking cessation efforts.  Continue to try to taper down off of cigarettes completely.  For your liver enzyme elevation, we will repeat  metabolic panel today and I placed an order for you to get abdomen ultrasound.  Please go downstairs today and see when they can get you scheduled for that ultrasound.  For your high cholesterol will get fasting lipid panel today to assess your numbers.  After review of lipid panel will make decision if dose adjustment needs to be done.  Follow-up date to be determined after lab review.  Kayton Dunaj, Percell Miller, PA-C

## 2017-09-16 ENCOUNTER — Telehealth: Payer: Self-pay | Admitting: Medical

## 2017-09-16 DIAGNOSIS — K760 Fatty (change of) liver, not elsewhere classified: Secondary | ICD-10-CM

## 2017-09-16 DIAGNOSIS — R748 Abnormal levels of other serum enzymes: Secondary | ICD-10-CM

## 2017-09-16 NOTE — Telephone Encounter (Signed)
Will investigate if hep A igM can be added.

## 2017-09-16 NOTE — Telephone Encounter (Signed)
Referral to liver specialist placed and future CMP placed

## 2017-09-17 LAB — TEST AUTHORIZATION

## 2017-09-17 LAB — HEPATITIS B CORE ANTIBODY, TOTAL: HEP B C TOTAL AB: NONREACTIVE

## 2017-09-17 LAB — HEPATITIS B E ANTIBODY: HEP B E AB: NONREACTIVE

## 2017-09-17 LAB — HEPATITIS A ANTIBODY, TOTAL: Hepatitis A AB,Total: REACTIVE — AB

## 2017-09-17 LAB — HEPATITIS A ANTIBODY, IGM: HEP A IGM: NONREACTIVE

## 2017-09-17 LAB — HEPATITIS B SURFACE ANTIBODY,QUALITATIVE: HEP B S AB: NONREACTIVE

## 2017-09-17 LAB — HEPATITIS C ANTIBODY
Hepatitis C Ab: NONREACTIVE
SIGNAL TO CUT-OFF: 0.01 (ref <1.00)

## 2017-09-21 ENCOUNTER — Other Ambulatory Visit: Payer: Self-pay | Admitting: Medical

## 2017-09-21 DIAGNOSIS — K219 Gastro-esophageal reflux disease without esophagitis: Secondary | ICD-10-CM

## 2017-10-12 ENCOUNTER — Other Ambulatory Visit: Payer: Self-pay | Admitting: Medical

## 2017-10-12 DIAGNOSIS — K219 Gastro-esophageal reflux disease without esophagitis: Secondary | ICD-10-CM

## 2017-11-20 DIAGNOSIS — J209 Acute bronchitis, unspecified: Secondary | ICD-10-CM | POA: Diagnosis not present

## 2017-12-14 ENCOUNTER — Telehealth: Payer: Self-pay | Admitting: Medical

## 2017-12-17 ENCOUNTER — Other Ambulatory Visit: Payer: Self-pay | Admitting: Medical

## 2017-12-17 NOTE — Telephone Encounter (Signed)
Pt called in to follow up. Pt says that she is completely out of her medication. Pt says that she has been for about 3 days. She would like to know if provider would go ahead and refill medications (FLUoxetine (PROZAC) 20 MG capsule)    Pharmacy: [facility], [place] - [address]

## 2017-12-18 NOTE — Telephone Encounter (Signed)
Pt said she needs a 20mg  for her to take 3 a day for this. She wants to know will this be filled, she is out. Please call patient [phone]

## 2017-12-18 NOTE — Telephone Encounter (Signed)
Fluoxetine refill Last OV: 12/03/15 Last Refill:08/14/17 Pharmacy:[facility]  Sending back to office

## 2017-12-21 MED ORDER — FLUOXETINE HCL 20 MG PO CAPS: 60.0000 mg | ORAL_CAPSULE | Freq: Every day | ORAL | 2 refills | Status: DC

## 2017-12-21 NOTE — Telephone Encounter (Signed)
rx sent to pharmacy

## 2017-12-21 NOTE — Addendum Note (Signed)
Addended by: Hinton Dyer on: 12/21/2017 08:16 AM   Modules accepted: Orders

## 2018-01-12 ENCOUNTER — Other Ambulatory Visit: Payer: Self-pay | Admitting: Medical

## 2018-01-12 DIAGNOSIS — K219 Gastro-esophageal reflux disease without esophagitis: Secondary | ICD-10-CM

## 2018-01-14 NOTE — Telephone Encounter (Signed)
Patient checking status of refill. Please advise. Call back [phone]

## 2018-02-18 ENCOUNTER — Other Ambulatory Visit: Payer: Self-pay | Admitting: Medical

## 2018-03-13 ENCOUNTER — Other Ambulatory Visit: Payer: Self-pay | Admitting: Medical

## 2018-03-19 ENCOUNTER — Ambulatory Visit: Payer: 59 | Admitting: Family Medicine

## 2018-03-19 ENCOUNTER — Encounter (HOSPITAL_BASED_OUTPATIENT_CLINIC_OR_DEPARTMENT_OTHER): Payer: Self-pay | Admitting: Emergency Medicine

## 2018-03-19 ENCOUNTER — Other Ambulatory Visit: Payer: Self-pay

## 2018-03-19 ENCOUNTER — Telehealth: Payer: Self-pay

## 2018-03-19 ENCOUNTER — Emergency Department (HOSPITAL_BASED_OUTPATIENT_CLINIC_OR_DEPARTMENT_OTHER)
Admission: EM | Admit: 2018-03-19 | Discharge: 2018-03-19 | Disposition: A | Discharge status: Home / Self Care | Payer: 59 | Attending: Emergency Medicine | Admitting: Emergency Medicine

## 2018-03-19 DIAGNOSIS — Z79899 Other long term (current) drug therapy: Secondary | ICD-10-CM | POA: Insufficient documentation

## 2018-03-19 DIAGNOSIS — E876 Hypokalemia: Secondary | ICD-10-CM | POA: Insufficient documentation

## 2018-03-19 DIAGNOSIS — F17210 Nicotine dependence, cigarettes, uncomplicated: Secondary | ICD-10-CM | POA: Diagnosis not present

## 2018-03-19 DIAGNOSIS — I10 Essential (primary) hypertension: Secondary | ICD-10-CM | POA: Insufficient documentation

## 2018-03-19 DIAGNOSIS — R002 Palpitations: Secondary | ICD-10-CM | POA: Diagnosis not present

## 2018-03-19 HISTORY — DX: Essential (primary) hypertension: I10

## 2018-03-19 LAB — BASIC METABOLIC PANEL
ANION GAP: 12 (ref 5–15)
BUN: 6 mg/dL (ref 6–20)
CHLORIDE: 101 mmol/L (ref 101–111)
CO2: 25 mmol/L (ref 22–32)
Calcium: 8.1 mg/dL — ABNORMAL LOW (ref 8.9–10.3)
Creatinine, Ser: 0.75 mg/dL (ref 0.44–1.00)
GFR calc Af Amer: >60 mL/min (ref >60)
GFR calc non Af Amer: >60 mL/min (ref >60)
GLUCOSE: 104 mg/dL — AB (ref 65–99)
POTASSIUM: 2.7 mmol/L — AB (ref 3.5–5.1)
SODIUM: 138 mmol/L (ref 135–145)

## 2018-03-19 LAB — CBC
HEMATOCRIT: 41.4 % (ref 36.0–46.0)
HEMOGLOBIN: 13.7 g/dL (ref 12.0–15.0)
MCH: 29 pg (ref 26.0–34.0)
MCHC: 33.1 g/dL (ref 30.0–36.0)
MCV: 87.5 fL (ref 78.0–100.0)
Platelets: 215 10*3/uL (ref 150–400)
RBC: 4.73 MIL/uL (ref 3.87–5.11)
RDW: 16 % — AB (ref 11.5–15.5)
WBC: 9.6 10*3/uL (ref 4.0–10.5)

## 2018-03-19 LAB — TROPONIN I: Troponin I: <0.03 ng/mL (ref <0.03)

## 2018-03-19 LAB — MAGNESIUM: MAGNESIUM: 1.5 mg/dL — AB (ref 1.7–2.4)

## 2018-03-19 MED ORDER — POTASSIUM CHLORIDE 10 MEQ/100ML IV SOLN
10.0000 meq | INTRAVENOUS | Status: AC
  Administered 2018-03-19 (×2): 10 meq via INTRAVENOUS
  Filled 2018-03-19 (×3): qty 100

## 2018-03-19 MED ORDER — POTASSIUM CHLORIDE CRYS ER 20 MEQ PO TBCR
40.0000 meq | EXTENDED_RELEASE_TABLET | Freq: Once | ORAL | Status: AC
  Administered 2018-03-19: 40 meq via ORAL
  Filled 2018-03-19: qty 2

## 2018-03-19 MED ORDER — MAGNESIUM SULFATE 50 % IJ SOLN: 1.0000 g | Freq: Once | INTRAMUSCULAR | Status: DC

## 2018-03-19 MED ORDER — MAGNESIUM SULFATE IN D5W 1-5 GM/100ML-% IV SOLN
1.0000 g | Freq: Once | INTRAVENOUS | Status: AC
  Administered 2018-03-19: 1 g via INTRAVENOUS
  Filled 2018-03-19: qty 100

## 2018-03-19 NOTE — ED Triage Notes (Signed)
Pt states she has "felt weird" all day. She has been having palpitations and found her BP to be high at work. Endorsees mild SOB

## 2018-03-19 NOTE — Telephone Encounter (Signed)
Spoke with patient and advised her to go to the nearest ER due to her having current chest pain, discomfort and elevated BP  Patient stated " my chest does not hurt, I have discomfort and I feel weird, I took my lbood pressure here at work on a machine and it was high,.. I just dont feel right"  I advised her that because she was having active chest discomfort and her blood pressure was elevated she needed to be worked up sooner than later and needed to be seen quickly, even if it was to receive IV fluids for dehydration, or to have an EKG done. She voiced her understanding and stated she would go to the ER downstairs.

## 2018-03-19 NOTE — ED Notes (Signed)
ED Provider at bedside. 

## 2018-03-19 NOTE — Discharge Instructions (Signed)
It was my pleasure taking care of you today!   Please call your primary care doctor to schedule a follow up appointment to get your electrolytes rechecked next week.   Increase the amount of potassium-rich foods that you eat over the weekend (see attachment).   Return to ER for new or worsening symptoms, any additional concerns.

## 2018-03-19 NOTE — ED Provider Notes (Signed)
[facility] EMERGENCY DEPARTMENT Provider Note   CSN: [id number] Arrival date & time: 03/19/18  1540     History   Chief Complaint Chief Complaint  Patient presents with  . Palpitations    HPI Lauren Case is a 52 y.o. female.  The history is provided by the patient and medical records. No language interpreter was used.   Lauren Case is a 52 y.o. female  with a PMH of HTN, OCD, ADD who presents to the Emergency Department complaining of "not feeling right". This began today mid-morning while at work. She reports feeling very anxious about this feeling and "got myself worked up" causing increasing anxiety. She did take a quarter of a xanax to help her calm down, but does not feel like this helped. She had someone at work her blood pressure and it was 190/90s.  This is much higher than her usual.  She became very worried about this and felt even more anxious.  She does report history of hypertension, taking losartan daily.  She took her losartan today and does not report any missed doses.  She denies any chest pain, shortness of breath, abdominal pain, back pain, headaches, numbness, tingling.  Past Medical History:  Diagnosis Date  . Chicken pox   . Environmental allergies   . GERD (gastroesophageal reflux disease)   . Hypertension   . Measles   . Mumps   . Seasonal allergies     Patient Active Problem List   Diagnosis Date Noted  . Rash and nonspecific skin eruption 01/14/2016  . Elevated BP 12/08/2014  . Psoriasis (a type of skin inflammation) 12/08/2014  . ADD (attention deficit disorder) 04/23/2014  . GERD (gastroesophageal reflux disease) 02/23/2014  . Visit for preventive health examination 02/23/2014  . OCD (obsessive compulsive disorder) 02/23/2014    Past Surgical History:  Procedure Laterality Date  . ESSURE TUBAL LIGATION    . TONSILLECTOMY  1984  . WISDOM TOOTH EXTRACTION       OB History   None      Home Medications     Prior to Admission medications   Medication Sig Start Date End Date Taking? Authorizing Provider  buPROPion (WELLBUTRIN XL) 150 MG 24 hr tablet TAKE 1 TABLET BY MOUTH EVERY DAY 01/14/18  Yes Saguier, Percell Miller, PA-C  cetirizine (ZYRTEC) 10 MG tablet Take 10 mg by mouth daily.   Yes [provider]  FLUoxetine (PROZAC) 20 MG capsule TAKE 3 CAPSULES BY MOUTH EVERY DAY 02/18/18  Yes Saguier, Percell Miller, PA-C  losartan (COZAAR) 100 MG tablet TAKE 1 TABLET BY MOUTH EVERY DAY 01/14/18  Yes Saguier, Percell Miller, PA-C  omeprazole (PRILOSEC) 20 MG capsule TAKE 1 CAPSULE BY MOUTH DAILY 01/14/18  Yes Saguier, Percell Miller, PA-C  azelastine (ASTELIN) 0.1 % nasal spray Place 2 sprays into both nostrils 2 (two) times daily. Use in each nostril as directed 12/22/16   Saguier, Percell Miller, PA-C  fluticasone [facility]) 50 MCG/ACT nasal spray Place 2 sprays into both nostrils daily. 10/10/16   Brunetta Jeans, PA-C  levocetirizine (XYZAL) 5 MG tablet Take 1 tablet (5 mg total) by mouth every evening. 12/22/16   Saguier, Percell Miller, PA-C  rosuvastatin (CRESTOR) 10 MG tablet Take 1 tablet (10 mg total) by mouth daily. 06/29/17   Saguier, Percell Miller, PA-C  tiZANidine (ZANAFLEX) 4 MG tablet Take 1 tablet (4 mg total) by mouth at bedtime. 06/11/16   Brunetta Jeans, PA-C  traZODone (DESYREL) 50 MG tablet Take 0.5 tablets (25 mg total) by  mouth at bedtime as needed for sleep. 06/11/16   Brunetta Jeans, PA-C    Family History Family History  Problem Relation Age of Onset  . Healthy Mother        Living  . Healthy Father        Living  . Diabetes Maternal Grandfather        Borderline  . COPD Maternal Aunt   . COPD Maternal Uncle   . Alzheimer's disease Maternal Grandmother   . Alzheimer's disease Paternal Grandfather   . Healthy Brother        x1  . Allergies Son        x1    Social History Social History   Tobacco Use  . Smoking status: Current Every Day Smoker    Packs/day: 1.00    Years: 30.00    Pack years: 30.00  .  Smokeless tobacco: Never Used  Substance Use Topics  . Alcohol use: Yes    Alcohol/week: 6.0 oz    Types: 10 Shots of liquor per week    Comment: daily  . Drug use: No     Allergies   Other and Penicillins   Review of Systems Review of Systems  Constitutional:       + "not feeling right"  Psychiatric/Behavioral: The patient is nervous/anxious.   All other systems reviewed and are negative.    Physical Exam Updated Vital Signs BP (!) 157/94   Pulse 72   Temp 98.3 F (36.8 C) (Oral)   Resp (!) 31   Ht 5\' 4"  (1.626 m)   Wt 90.7 kg (200 lb)   SpO2 94%   BMI 34.33 kg/m   Physical Exam  Constitutional: She is oriented to person, place, and time. She appears well-developed and well-nourished. No distress.  HENT:  Head: Normocephalic and atraumatic.  Cardiovascular: Normal rate, regular rhythm and normal heart sounds.  No murmur heard. Pulmonary/Chest: Effort normal and breath sounds normal. No respiratory distress. She has no wheezes. She has no rales.  Abdominal: Soft. She exhibits no distension. There is no tenderness.  Musculoskeletal: She exhibits no edema.  Neurological: She is alert and oriented to person, place, and time.  Skin: Skin is warm and dry.  Nursing note and vitals reviewed.    ED Treatments / Results  Labs (all labs ordered are listed, but only abnormal results are displayed) Labs Reviewed  BASIC METABOLIC PANEL - Abnormal; Notable for the following components:      Result Value   Potassium 2.7 (*)    Glucose, Bld 104 (*)    Calcium 8.1 (*)    All other components within normal limits  CBC - Abnormal; Notable for the following components:   RDW 16.0 (*)    All other components within normal limits  MAGNESIUM - Abnormal; Notable for the following components:   Magnesium 1.5 (*)    All other components within normal limits  TROPONIN I    EKG EKG Interpretation  Date/Time:  Friday Mar 19 2018 15:47:49 EDT Ventricular Rate:  77 PR  Interval:    QRS Duration: 95 QT Interval:  451 QTC Calculation: 511 R Axis:   64 Text Interpretation:  Sinus rhythm Prolonged QT interval Baseline wander in lead(s) III aVF V1 V3 V5 V6 no prior available for comparison Confirmed by Quintella Reichert [phone]) on 03/19/2018 3:53:02 PM   Radiology No results found.  Procedures Procedures (including critical care time)  Medications Ordered in ED Medications  potassium chloride SA (  K-DUR,KLOR-CON) CR tablet 40 mEq (40 mEq Oral Given 03/19/18 1649)  potassium chloride 10 mEq in 100 mL IVPB (0 mEq Intravenous Stopped 03/19/18 1903)  magnesium sulfate IVPB 1 g 100 mL (0 g Intravenous Stopped 03/19/18 1902)     Initial Impression / Assessment and Plan / ED Course  I have reviewed the triage vital signs and the nursing notes.  Pertinent labs & imaging results that were available during my care of the patient were reviewed by me and considered in my medical decision making (see chart for details).    Lauren Case is a 52 y.o. female who presents to ED for "not feeling right". She is unable to give me any clear symptoms or description of what does not feel well. She did report feeling very anxious about how she was feeling. Normal cardiopulmonary exam. Troponin and EKG reassuring. Labs reviewed: Hypokalemia at 2.7 noted.  Magnesium level added which resulted at 1.5.  Electrolytes replaced in the emergency department today. She feels improved on recheck. Has PCP and will follow up with him early next week for recheck labs. Reasons to return to ER discussed and all questions answered.   Patient discussed with Dr. Ralene Bathe who agrees with treatment plan.    Final Clinical Impressions(s) / ED Diagnoses   Final diagnoses:  Hypokalemia  Hypomagnesemia    ED Discharge Orders    None       Malosi Hemstreet, Ozella Almond, PA-C 03/19/18 Lorie Phenix, MD 03/20/18 1447

## 2018-03-22 ENCOUNTER — Encounter (HOSPITAL_BASED_OUTPATIENT_CLINIC_OR_DEPARTMENT_OTHER): Payer: Self-pay | Admitting: *Deleted

## 2018-03-22 ENCOUNTER — Other Ambulatory Visit: Payer: Self-pay

## 2018-03-22 ENCOUNTER — Telehealth: Payer: Self-pay | Admitting: Medical

## 2018-03-22 ENCOUNTER — Ambulatory Visit: Payer: Self-pay

## 2018-03-22 ENCOUNTER — Emergency Department (HOSPITAL_BASED_OUTPATIENT_CLINIC_OR_DEPARTMENT_OTHER)
Admission: EM | Admit: 2018-03-22 | Discharge: 2018-03-22 | Disposition: A | Discharge status: Home / Self Care | Payer: 59 | Attending: Emergency Medicine | Admitting: Emergency Medicine

## 2018-03-22 DIAGNOSIS — I10 Essential (primary) hypertension: Secondary | ICD-10-CM | POA: Diagnosis not present

## 2018-03-22 DIAGNOSIS — Z5321 Procedure and treatment not carried out due to patient leaving prior to being seen by health care provider: Secondary | ICD-10-CM | POA: Diagnosis not present

## 2018-03-22 NOTE — ED Triage Notes (Signed)
States she came to have her BP checked. Her was seen 3 days ago after feeling weird. Her BP was elevated. She was given IV K+ and Magnesium. States she has felt good until this afternoon and she started to feel the same was she did 3 days ago.

## 2018-03-22 NOTE — Telephone Encounter (Signed)
She has been seen twice recently by the emergency department.  Will you reach out to her tomorrow and to get her scheduled for Wednesday or Thursday early morning or early afternoon.

## 2018-03-22 NOTE — Telephone Encounter (Signed)
Patient called in with c/o "high blood pressure." She says "I am feeling weird, I can't explain it. I was feeling this way on Friday and was told by the office to go to the ED. I went and found my potassium was low, I had to receive potassium and magnesium. My BP was elevated as well, but I don't know if anything was given for that. I went home and my mom bought me some potassium pills to take. Today, I'm at work and started feeling the same way. My BP was checked and it was 185/99 about 15 minutes before I called you. I am not dizzy, no chest pain, but my chest feels like it's fluttering." I asked about other symptoms, she denies. According to protocol, see PCP within 24 hours. I asked to put her on hold to see if she could be worked in today. I called the office and spoke to [place], [facility] who advised there was no availability and to go to the ED for the BP, but go ahead and schedule an appointment for tomorrow. I advised the patient of the [facility] recommendations, appointment scheduled for tomorrow at 0915. She asked "what do I need to do about my BP right now? Can I go ahead and take 1/2 of my Losartan?" I advised I could not recommend her to do that, but I will call over to the office to see if the provider is ok for her to take the 1/2 losartan. I called and spoke to Levada Dy, RN and she asked to speak to the patient. Care advice given to patient,  patient was conference in to [place].   Reason for Disposition . Systolic BP  >= 595 OR Diastolic >= 638  Answer Assessment - Initial Assessment Questions 1. BLOOD PRESSURE: "What is the blood pressure?" "Did you take at least two measurements 5 minutes apart?"     185/99 2. ONSET: "When did you take your blood pressure?"     15  Minutes ago 3. HOW: "How did you obtain the blood pressure?" (e.g., visiting nurse, automatic home BP monitor)     Checked at work with automatic cuff 4. HISTORY: "Do you have a history of high blood pressure?"     Yes 5. MEDICATIONS:  "Are you taking any medications for blood pressure?" "Have you missed any doses recently?"     Yes, taking as prescribed 6. OTHER SYMPTOMS: "Do you have any symptoms?" (e.g., headache, chest pain, blurred vision, difficulty breathing, weakness)     Feeling chest is fluttering 7. PREGNANCY: "Is there any chance you are pregnant?" "When was your last menstrual period?"     No  Protocols used: HIGH BLOOD PRESSURE-A-AH

## 2018-03-22 NOTE — Telephone Encounter (Signed)
Author received call from [facility] regarding pt's recurrent HA, nausea, "just not feeling right" complaint. Pt. recently seen in ED 5/17 for the same set of symptoms. Pt. reports BP is 180s/90s while at work. Pt. Denies any lifestyle or diet changes, and medications were reviewed; pt. Verified that she is taking her BP and depression meds as prescribed. Pt denies having any known renal issues. Pt stated after she received electrolyte replacement on 5/17 she felt better. Author instructed pt. to return to ED for re-draw of labs and electrolyte replacement if warranted,  until pt. Can be stabilized enough to see PCP for follow-up. Pt. Stated she would go to ED. Appointment with Dr. Harvie Heck cancelled in the meantime. Will follow-up.

## 2018-03-22 NOTE — Telephone Encounter (Signed)
Seen twice by the emergency department recently.  Will you call her and get her scheduled to follow-up with me Wednesday or Thursday.  Early morning or early afternoon.  Thanks

## 2018-03-22 NOTE — Telephone Encounter (Signed)
Error trying to send message to staff.

## 2018-03-22 NOTE — Telephone Encounter (Signed)
Opened to get patient in within next 3 days since see in ED. But epic glitch when I try to forward message. So will send note later after review ED note.

## 2018-03-23 ENCOUNTER — Ambulatory Visit: Payer: 59 | Admitting: Medical

## 2018-03-24 ENCOUNTER — Ambulatory Visit: Payer: 59 | Admitting: Medical

## 2018-03-24 ENCOUNTER — Encounter: Payer: Self-pay | Admitting: Medical

## 2018-03-24 VITALS — BP 125/107 | HR 81 | Temp 98.3°F | Resp 16 | Ht 64.0 in | Wt 196.8 lb

## 2018-03-24 DIAGNOSIS — E876 Hypokalemia: Secondary | ICD-10-CM | POA: Diagnosis not present

## 2018-03-24 DIAGNOSIS — I10 Essential (primary) hypertension: Secondary | ICD-10-CM

## 2018-03-24 DIAGNOSIS — R790 Abnormal level of blood mineral: Secondary | ICD-10-CM | POA: Diagnosis not present

## 2018-03-24 DIAGNOSIS — F419 Anxiety disorder, unspecified: Secondary | ICD-10-CM | POA: Diagnosis not present

## 2018-03-24 LAB — COMPREHENSIVE METABOLIC PANEL
ALBUMIN: 3.8 g/dL (ref 3.5–5.2)
ALK PHOS: 76 U/L (ref 39–117)
ALT: 29 U/L (ref 0–35)
AST: 45 U/L — ABNORMAL HIGH (ref 0–37)
BILIRUBIN TOTAL: 0.4 mg/dL (ref 0.2–1.2)
BUN: 6 mg/dL (ref 6–23)
CALCIUM: 9.5 mg/dL (ref 8.4–10.5)
CO2: 34 mEq/L — ABNORMAL HIGH (ref 19–32)
Chloride: 100 mEq/L (ref 96–112)
Creatinine, Ser: 0.75 mg/dL (ref 0.40–1.20)
GFR: 85.47 mL/min (ref >60.00)
Glucose, Bld: 96 mg/dL (ref 70–99)
POTASSIUM: 4.3 meq/L (ref 3.5–5.1)
Sodium: 143 mEq/L (ref 135–145)
TOTAL PROTEIN: 6.1 g/dL (ref 6.0–8.3)

## 2018-03-24 LAB — MAGNESIUM: MAGNESIUM: 1.7 mg/dL (ref 1.5–2.5)

## 2018-03-24 MED ORDER — AMLODIPINE BESYLATE 5 MG PO TABS: 5.0000 mg | ORAL_TABLET | Freq: Every day | ORAL | 0 refills | Status: DC

## 2018-03-24 MED ORDER — CLONAZEPAM 0.5 MG PO TABS: 0.5000 mg | ORAL_TABLET | Freq: Two times a day (BID) | ORAL | 0 refills | Status: AC | PRN

## 2018-03-24 NOTE — Patient Instructions (Addendum)
For your recent high blood pressure, I want you to continue the losartan and adding amlodipine 5 mg tablets daily.  For recent low potassium and magnesium in the emergency department, I am repeating labs today to check on those levels.  Your EKG in the emergency department read as prolonged QT interval.  I am repeating EKG today to evaluate this. Today appears nsr with no qt prolongation.  You mentioned a recent anxiety.  I am giving you short course of clonazepam to use if needed.  Recommend use sparingly.  I do not want to give  this to long-term but just over the next month.  You also mentioned at the end some moderately heavy daily alcohol use.  You mentioned that you been able to stop in the past but then restarted.  I would recommend that she taper down your alcohol use over the next 3 weeks.  If you find that you are unable to do this then might refer you to behavioral health at that point.  Note also want to explain that  if you know you are going to drink alcohol at night then just use clonazepam in the morning only.(If needed use clonazepam 12 hours apart from any alcohol use) Not recommended to use both at same time.   Follow-up in 3 weeks or as needed.

## 2018-03-24 NOTE — Progress Notes (Signed)
Subjective:    Patient ID: Lauren Case, female    DOB: 07/06/1966, 52 y.o.   MRN: [id number]  HPI  Pt in for follow up on 2 emergency department visits.   Both for hypertension. Monday she had 181/99 at ED. Pt other ED visit before last showed bp of 157/94.  In ED she had low k and low mag.  Recent feeling some anxiety when her blood pressure goes up. Faint light headed sensation then she gets anxious.   ekg in ED. That ekg stated prolonged qt interval. She was give magnesium in the ED.  Pt only on losartan for bp.   2-3 shots of vodka at night. She wants to stop. She expresses that she might get more anxious as she cuts back/quits.   Review of Systems  Constitutional: Negative for chills, fatigue and fever.  HENT: Negative for congestion and facial swelling.   Respiratory: Negative for cough, chest tightness, shortness of breath and wheezing.   Cardiovascular: Negative for palpitations.  Gastrointestinal: Negative for abdominal pain, diarrhea, nausea, rectal pain and vomiting.  Musculoskeletal: Negative for back pain.  Neurological: Negative for dizziness, speech difficulty, numbness and headaches.  Hematological: Negative for adenopathy. Does not bruise/bleed easily.  Psychiatric/Behavioral: Negative for confusion, sleep disturbance and suicidal ideas. The patient is nervous/anxious.        Mood stable recently.    Past Medical History:  Diagnosis Date  . Chicken pox   . Environmental allergies   . GERD (gastroesophageal reflux disease)   . Hypertension   . Measles   . Mumps   . Seasonal allergies      Social History   Socioeconomic History  . Marital status: Single    Spouse name: Not on file  . Number of children: Not on file  . Years of education: Not on file  . Highest education level: Not on file  Occupational History  . Not on file  Social Needs  . Financial resource strain: Not on file  . Food insecurity:    Worry: Not on file   Inability: Not on file  . Transportation needs:    Medical: Not on file    Non-medical: Not on file  Tobacco Use  . Smoking status: Current Every Day Smoker    Packs/day: 1.00    Years: 30.00    Pack years: 30.00  . Smokeless tobacco: Never Used  Substance and Sexual Activity  . Alcohol use: Yes    Alcohol/week: 6.0 oz    Types: 10 Shots of liquor per week    Comment: daily  . Drug use: No  . Sexual activity: Never    Birth control/protection: Surgical  Lifestyle  . Physical activity:    Days per week: Not on file    Minutes per session: Not on file  . Stress: Not on file  Relationships  . Social connections:    Talks on phone: Not on file    Gets together: Not on file    Attends religious service: Not on file    Active member of club or organization: Not on file    Attends meetings of clubs or organizations: Not on file    Relationship status: Not on file  . Intimate partner violence:    Fear of current or ex partner: Not on file    Emotionally abused: Not on file    Physically abused: Not on file    Forced sexual activity: Not on file  Other Topics Concern  .  Not on file  Social History Narrative  . Not on file    Past Surgical History:  Procedure Laterality Date  . ESSURE TUBAL LIGATION    . TONSILLECTOMY  1984  . WISDOM TOOTH EXTRACTION      Family History  Problem Relation Age of Onset  . Healthy Mother        Living  . Healthy Father        Living  . Diabetes Maternal Grandfather        Borderline  . COPD Maternal Aunt   . COPD Maternal Uncle   . Alzheimer's disease Maternal Grandmother   . Alzheimer's disease Paternal Grandfather   . Healthy Brother        x1  . Allergies Son        x1    Allergies  Allergen Reactions  . Other Anaphylaxis    Truffle Oils  . Penicillins Anaphylaxis    Current Outpatient Medications on File Prior to Visit  Medication Sig Dispense Refill  . azelastine (ASTELIN) 0.1 % nasal spray Place 2 sprays into both  nostrils 2 (two) times daily. Use in each nostril as directed 30 mL 3  . buPROPion (WELLBUTRIN XL) 150 MG 24 hr tablet TAKE 1 TABLET BY MOUTH EVERY DAY 30 tablet 2  . cetirizine (ZYRTEC) 10 MG tablet Take 10 mg by mouth daily.    Marland Kitchen FLUoxetine (PROZAC) 20 MG capsule TAKE 3 CAPSULES BY MOUTH EVERY DAY 90 capsule 2  . fluticasone (FLONASE) 50 MCG/ACT nasal spray Place 2 sprays into both nostrils daily. 16 g 2  . levocetirizine (XYZAL) 5 MG tablet Take 1 tablet (5 mg total) by mouth every evening. 30 tablet 3  . losartan (COZAAR) 100 MG tablet TAKE 1 TABLET BY MOUTH EVERY DAY 30 tablet 2  . omeprazole (PRILOSEC) 20 MG capsule TAKE 1 CAPSULE BY MOUTH DAILY 90 capsule 0  . tiZANidine (ZANAFLEX) 4 MG tablet Take 1 tablet (4 mg total) by mouth at bedtime. 15 tablet 0  . traZODone (DESYREL) 50 MG tablet Take 0.5 tablets (25 mg total) by mouth at bedtime as needed for sleep. 30 tablet 1   No current facility-administered medications on file prior to visit.     BP (!) 125/107   Pulse 81   Temp 98.3 F (36.8 C) (Oral)   Resp 16   Ht 5\' 4"  (1.626 m)   Wt 196 lb 12.8 oz (89.3 kg)   SpO2 98%   BMI 33.78 kg/m       Objective:   Physical Exam  General Mental Status- Alert. General Appearance- Not in acute distress.   Skin General: Color- Normal Color. Moisture- Normal Moisture.  Neck Carotid Arteries- Normal color. Moisture- Normal Moisture. No carotid bruits. No JVD.  Chest and Lung Exam Auscultation: Breath Sounds:-Normal.  Cardiovascular Auscultation:Rythm- Regular. Murmurs & Other Heart Sounds:Auscultation of the heart reveals- No Murmurs.  Abdomen Inspection:-Inspeection Normal. Palpation/Percussion:Note:No mass. Palpation and Percussion of the abdomen reveal- Non Tender, Non Distended + BS, no rebound or guarding.  Neurologic Cranial Nerve exam:- CN III-XII intact(No nystagmus), symmetric smile. Normal/Intact Strength:- 5/5 equal and symmetric strength both upper and lower  extremities.      Assessment & Plan:  For your recent high blood pressure, I want you to continue the losartan and adding amlodipine 5 mg tablets daily.  For recent low potassium and magnesium in the emergency department, I am repeating labs today to check on those levels.  Your EKG in the  emergency department read as prolonged QT interval.  I am repeating EKG today to evaluate this. Today appears nsr with no qt prolongation.  You mentioned a recent anxiety.  I am giving you short course of clonazepam to use if needed.  Recommend use sparingly.  I do not want to get this to long-term but just over the next month.  You also mentioned at the end some moderately heavy daily alcohol use.  You mentioned that you been able to stop in the past but then restarted.  I would recommend that she taper down your alcohol use over the next 3 weeks.  If you find that you are unable to do this then might refer you to behavioral health at that point.  Note also want to explain if you know you are going to drink alcohol at night then just use clonazepam in the morning only.(If needed use clonazepam 12 hours apart from any alcohol use)  Follow-up in 3 weeks or as needed.  Mackie Pai, PA-C

## 2018-04-12 ENCOUNTER — Other Ambulatory Visit: Payer: Self-pay | Admitting: Medical

## 2018-04-12 DIAGNOSIS — K219 Gastro-esophageal reflux disease without esophagitis: Secondary | ICD-10-CM

## 2018-04-14 ENCOUNTER — Ambulatory Visit: Payer: 59 | Admitting: Medical

## 2018-04-21 ENCOUNTER — Ambulatory Visit: Payer: 59 | Admitting: Medical

## 2018-04-29 ENCOUNTER — Other Ambulatory Visit: Payer: Self-pay | Admitting: Medical

## 2018-05-10 ENCOUNTER — Ambulatory Visit: Payer: 59 | Admitting: Medical

## 2018-05-14 ENCOUNTER — Encounter: Payer: Self-pay | Admitting: Medical

## 2018-05-14 ENCOUNTER — Ambulatory Visit: Payer: 59 | Admitting: Medical

## 2018-05-14 VITALS — BP 153/88 | HR 70 | Temp 98.2°F | Resp 16 | Ht 64.0 in | Wt 193.4 lb

## 2018-05-14 DIAGNOSIS — E785 Hyperlipidemia, unspecified: Secondary | ICD-10-CM

## 2018-05-14 DIAGNOSIS — R748 Abnormal levels of other serum enzymes: Secondary | ICD-10-CM | POA: Diagnosis not present

## 2018-05-14 DIAGNOSIS — F17200 Nicotine dependence, unspecified, uncomplicated: Secondary | ICD-10-CM | POA: Diagnosis not present

## 2018-05-14 DIAGNOSIS — I10 Essential (primary) hypertension: Secondary | ICD-10-CM

## 2018-05-14 LAB — LIPID PANEL
CHOLESTEROL: 227 mg/dL — AB (ref 0–200)
HDL: 52.7 mg/dL (ref >39.00)
NonHDL: 174.13
TRIGLYCERIDES: 251 mg/dL — AB (ref 0.0–149.0)
Total CHOL/HDL Ratio: 4
VLDL: 50.2 mg/dL — AB (ref 0.0–40.0)

## 2018-05-14 LAB — COMPREHENSIVE METABOLIC PANEL
ALBUMIN: 3.8 g/dL (ref 3.5–5.2)
ALK PHOS: 78 U/L (ref 39–117)
ALT: 31 U/L (ref 0–35)
AST: 41 U/L — ABNORMAL HIGH (ref 0–37)
BILIRUBIN TOTAL: 0.4 mg/dL (ref 0.2–1.2)
BUN: 5 mg/dL — ABNORMAL LOW (ref 6–23)
CALCIUM: 8.7 mg/dL (ref 8.4–10.5)
CO2: 32 mEq/L (ref 19–32)
Chloride: 100 mEq/L (ref 96–112)
Creatinine, Ser: 0.72 mg/dL (ref 0.40–1.20)
GFR: 89.54 mL/min (ref >60.00)
GLUCOSE: 97 mg/dL (ref 70–99)
Potassium: 3.2 mEq/L — ABNORMAL LOW (ref 3.5–5.1)
Sodium: 142 mEq/L (ref 135–145)
TOTAL PROTEIN: 6.1 g/dL (ref 6.0–8.3)

## 2018-05-14 LAB — LDL CHOLESTEROL, DIRECT: LDL DIRECT: 152 mg/dL

## 2018-05-14 MED ORDER — AMLODIPINE BESYLATE 10 MG PO TABS: 10.0000 mg | ORAL_TABLET | Freq: Every day | ORAL | 3 refills | Status: DC

## 2018-05-14 NOTE — Patient Instructions (Addendum)
Your blood pressure is improved from prior levels but still running a little bit high.  I do think you will benefit from increasing amlodipine to 10 mg a day.  Keep checking her blood pressure and would like to see your blood pressure approximately 356-861 systolic and 68-37 diastolic.  For history of smoking, I do want you to get a chest x-ray today.  On discussion today your cough might be related to allergies as you see correlation with a dust exposure.  Continue with allergy medications.  For history of elevated liver enzymes, tried to avoid fatty foods, alcohol, Tylenol and foods or beverages with fructose.  Refer to specialist in the past.  If the liver enzymes are increasing would encourage seeing a specialist again.  For history of high cholesterol, we will repeat lipid panel and metabolic panel today.  If lipids still high consider Zetia.  Statin would be more efficient and dropping cholesterol levels but you do have concern about your liver enzymes.  Follow-up date to be determined after lab review.

## 2018-05-14 NOTE — Progress Notes (Signed)
Subjective:    Patient ID: Lauren Case, female    DOB: 03/21/1966, 52 y.o.   MRN: [id number]  HPI  Pt in for follow up.  Pt bp have been 140-150/90-95 at work. Checked by automatic/electronic cuff.  No current cardiac or neurologic signs or symptoms.  Pt has noticed trend of higher bp when eats high salt diet.  Pt has high cholesterol. Pt has fear of getting liver issues due to cousin having liver failure related to fatty liver. Her enzymes have been mild elevated. I referred her due to high liver enzymes. She did not go due to cost. Pt does have known fatty liver.  The 10-year ASCVD risk score Mikey Bussing DC Brooke Bonito., et al., 2013) is: 8.8%   Values used to calculate the score:     Age: 75 years     Sex: Female     Is Non-Hispanic African American: No     Diabetic: No     Tobacco smoker: Yes     Systolic Blood Pressure: 858 mmHg     Is BP treated: Yes     HDL Cholesterol: 64.8 mg/dL     Total Cholesterol: 238 mg/dL   Pt states she has decreased smoking now to 5-6 a day. Down from a pack. On Wellbutrin.  Review of Systems  Constitutional: Negative for chills, diaphoresis, fatigue and fever.  HENT: Negative for congestion, ear discharge, ear pain, postnasal drip, rhinorrhea and sinus pain.        Tickle at time in throat with dust exposure at work.  Respiratory: Positive for cough.        Sometimes with dust exposure will cough randomly.  Has zyrtec, astelin and flonase.   Cardiovascular: Negative for chest pain.  Gastrointestinal: Negative for abdominal pain, blood in stool, diarrhea, nausea and vomiting.  Musculoskeletal: Negative for back pain and myalgias.  Neurological: Negative for dizziness, weakness, light-headedness and headaches.  Hematological: Negative for adenopathy. Does not bruise/bleed easily.  Psychiatric/Behavioral: Negative for behavioral problems and confusion.    Past Medical History:  Diagnosis Date  . Chicken pox   . Environmental allergies   .  GERD (gastroesophageal reflux disease)   . Hypertension   . Measles   . Mumps   . Seasonal allergies      Social History   Socioeconomic History  . Marital status: Single    Spouse name: Not on file  . Number of children: Not on file  . Years of education: Not on file  . Highest education level: Not on file  Occupational History  . Not on file  Social Needs  . Financial resource strain: Not on file  . Food insecurity:    Worry: Not on file    Inability: Not on file  . Transportation needs:    Medical: Not on file    Non-medical: Not on file  Tobacco Use  . Smoking status: Current Every Day Smoker    Packs/day: 1.00    Years: 30.00    Pack years: 30.00  . Smokeless tobacco: Never Used  Substance and Sexual Activity  . Alcohol use: Yes    Alcohol/week: 6.0 oz    Types: 10 Shots of liquor per week    Comment: daily  . Drug use: No  . Sexual activity: Never    Birth control/protection: Surgical  Lifestyle  . Physical activity:    Days per week: Not on file    Minutes per session: Not on file  . Stress: Not  on file  Relationships  . Social connections:    Talks on phone: Not on file    Gets together: Not on file    Attends religious service: Not on file    Active member of club or organization: Not on file    Attends meetings of clubs or organizations: Not on file    Relationship status: Not on file  . Intimate partner violence:    Fear of current or ex partner: Not on file    Emotionally abused: Not on file    Physically abused: Not on file    Forced sexual activity: Not on file  Other Topics Concern  . Not on file  Social History Narrative  . Not on file    Past Surgical History:  Procedure Laterality Date  . ESSURE TUBAL LIGATION    . TONSILLECTOMY  1984  . WISDOM TOOTH EXTRACTION      Family History  Problem Relation Age of Onset  . Healthy Mother        Living  . Healthy Father        Living  . Diabetes Maternal Grandfather         Borderline  . COPD Maternal Aunt   . COPD Maternal Uncle   . Alzheimer's disease Maternal Grandmother   . Alzheimer's disease Paternal Grandfather   . Healthy Brother        x1  . Allergies Son        x1    Allergies  Allergen Reactions  . Other Anaphylaxis    Truffle Oils  . Penicillins Anaphylaxis    Current Outpatient Medications on File Prior to Visit  Medication Sig Dispense Refill  . azelastine (ASTELIN) 0.1 % nasal spray Place 2 sprays into both nostrils 2 (two) times daily. Use in each nostril as directed 30 mL 3  . buPROPion (WELLBUTRIN XL) 150 MG 24 hr tablet TAKE 1 TABLET BY MOUTH EVERY DAY 30 tablet 2  . cetirizine (ZYRTEC) 10 MG tablet Take 10 mg by mouth daily.    . clonazePAM (KLONOPIN) 0.5 MG tablet Take 1 tablet (0.5 mg total) by mouth 2 (two) times daily as needed for anxiety. 14 tablet 0  . FLUoxetine (PROZAC) 20 MG capsule TAKE 3 CAPSULES BY MOUTH EVERY DAY 90 capsule 2  . fluticasone (FLONASE) 50 MCG/ACT nasal spray Place 2 sprays into both nostrils daily. 16 g 2  . levocetirizine (XYZAL) 5 MG tablet Take 1 tablet (5 mg total) by mouth every evening. 30 tablet 3  . losartan (COZAAR) 100 MG tablet TAKE 1 TABLET BY MOUTH EVERY DAY 30 tablet 2  . omeprazole (PRILOSEC) 20 MG capsule TAKE 1 CAPSULE BY MOUTH DAILY 90 capsule 0  . tiZANidine (ZANAFLEX) 4 MG tablet Take 1 tablet (4 mg total) by mouth at bedtime. 15 tablet 0  . traZODone (DESYREL) 50 MG tablet Take 0.5 tablets (25 mg total) by mouth at bedtime as needed for sleep. 30 tablet 1   No current facility-administered medications on file prior to visit.     BP (!) 153/88   Pulse 70   Temp 98.2 F (36.8 C) (Oral)   Resp 16   Ht 5\' 4"  (1.626 m)   Wt 193 lb 6.4 oz (87.7 kg)   SpO2 99%   BMI 33.20 kg/m      Objective:   Physical Exam  General Mental Status- Alert. General Appearance- Not in acute distress.   Skin General: Color- Normal Color. Moisture- Normal Moisture.  Neck Carotid Arteries-  Normal color. Moisture- Normal Moisture. No carotid bruits. No JVD.  Chest and Lung Exam Auscultation: Breath Sounds:-Normal.  Cardiovascular Auscultation:Rythm- Regular. Murmurs & Other Heart Sounds:Auscultation of the heart reveals- No Murmurs.  Abdomen Inspection:-Inspeection Normal. Palpation/Percussion:Note:No mass. Palpation and Percussion of the abdomen reveal- Non Tender, Non Distended + BS, no rebound or guarding.  Neurologic Cranial Nerve exam:- CN III-XII intact(No nystagmus), symmetric smile. Strength:- 5/5 equal and symmetric strength both upper and lower extremities.      Assessment & Plan:  Your blood pressure is improved from prior levels but still running a little bit high.  I do think you will benefit from increasing amlodipine to 10 mg a day.  Keep checking her blood pressure and would like to see your blood pressure approximately 213-086 systolic and 57-84 diastolic.  For history of smoking, I do want you to get a chest x-ray today.  On discussion today your cough might be related to allergies as you see correlation with a dust exposure.  Continue with allergy medications.  For history of elevated liver enzymes, tried to avoid fatty foods, alcohol, Tylenol and foods or beverages with fructose.  Refer to specialist in the past.  If the liver enzymes are increasing would encourage seeing a specialist again.  For history of high cholesterol, we will repeat lipid panel and metabolic panel today.  If lipids still high consider Zetia.  Statin would be more efficient and dropping cholesterol levels but you do have concern about your liver enzymes.  Follow-up date to be determined after lab review.  Mackie Pai, PA-C

## 2018-05-17 ENCOUNTER — Other Ambulatory Visit: Payer: Self-pay | Admitting: Medical

## 2018-05-31 ENCOUNTER — Other Ambulatory Visit: Payer: Self-pay | Admitting: Medical

## 2018-06-15 ENCOUNTER — Other Ambulatory Visit: Payer: Self-pay | Admitting: Medical

## 2018-06-15 DIAGNOSIS — K219 Gastro-esophageal reflux disease without esophagitis: Secondary | ICD-10-CM

## 2018-06-29 ENCOUNTER — Other Ambulatory Visit: Payer: Self-pay | Admitting: Medical

## 2018-07-23 DIAGNOSIS — E860 Dehydration: Secondary | ICD-10-CM | POA: Diagnosis not present

## 2018-07-23 DIAGNOSIS — G5133 Clonic hemifacial spasm, bilateral: Secondary | ICD-10-CM | POA: Diagnosis not present

## 2018-07-23 DIAGNOSIS — I499 Cardiac arrhythmia, unspecified: Secondary | ICD-10-CM | POA: Diagnosis not present

## 2018-07-23 DIAGNOSIS — R9431 Abnormal electrocardiogram [ECG] [EKG]: Secondary | ICD-10-CM | POA: Diagnosis not present

## 2018-07-23 DIAGNOSIS — E861 Hypovolemia: Secondary | ICD-10-CM | POA: Diagnosis not present

## 2018-07-24 DIAGNOSIS — I499 Cardiac arrhythmia, unspecified: Secondary | ICD-10-CM | POA: Diagnosis not present

## 2018-07-24 DIAGNOSIS — R9431 Abnormal electrocardiogram [ECG] [EKG]: Secondary | ICD-10-CM | POA: Diagnosis not present

## 2018-08-10 ENCOUNTER — Ambulatory Visit (HOSPITAL_BASED_OUTPATIENT_CLINIC_OR_DEPARTMENT_OTHER)
Admission: RE | Admit: 2018-08-10 | Discharge: 2018-08-10 | Disposition: A | Discharge status: Home / Self Care | Payer: 59 | Source: Ambulatory Visit | Attending: Medical | Admitting: Medical

## 2018-08-10 ENCOUNTER — Other Ambulatory Visit: Payer: Self-pay | Admitting: Medical

## 2018-08-10 DIAGNOSIS — Z1231 Encounter for screening mammogram for malignant neoplasm of breast: Secondary | ICD-10-CM

## 2018-08-10 DIAGNOSIS — J40 Bronchitis, not specified as acute or chronic: Secondary | ICD-10-CM | POA: Diagnosis not present

## 2018-08-10 DIAGNOSIS — R05 Cough: Secondary | ICD-10-CM | POA: Insufficient documentation

## 2018-08-10 DIAGNOSIS — F17200 Nicotine dependence, unspecified, uncomplicated: Secondary | ICD-10-CM | POA: Insufficient documentation

## 2018-08-10 DIAGNOSIS — R918 Other nonspecific abnormal finding of lung field: Secondary | ICD-10-CM | POA: Diagnosis not present

## 2018-09-01 ENCOUNTER — Ambulatory Visit: Payer: 59 | Admitting: Medical

## 2018-09-01 ENCOUNTER — Encounter: Payer: Self-pay | Admitting: Medical

## 2018-09-01 VITALS — BP 130/74 | HR 72 | Temp 98.1°F | Resp 16 | Ht 64.0 in | Wt 192.3 lb

## 2018-09-01 DIAGNOSIS — I10 Essential (primary) hypertension: Secondary | ICD-10-CM

## 2018-09-01 DIAGNOSIS — E785 Hyperlipidemia, unspecified: Secondary | ICD-10-CM | POA: Diagnosis not present

## 2018-09-01 DIAGNOSIS — Z23 Encounter for immunization: Secondary | ICD-10-CM | POA: Diagnosis not present

## 2018-09-01 DIAGNOSIS — J309 Allergic rhinitis, unspecified: Secondary | ICD-10-CM | POA: Diagnosis not present

## 2018-09-01 DIAGNOSIS — F17200 Nicotine dependence, unspecified, uncomplicated: Secondary | ICD-10-CM

## 2018-09-01 MED ORDER — NICOTINE 7 MG/24HR TD PT24: 7.0000 mg | MEDICATED_PATCH | Freq: Every day | TRANSDERMAL | 0 refills | Status: AC

## 2018-09-01 MED ORDER — ALBUTEROL SULFATE HFA 108 (90 BASE) MCG/ACT IN AERS: 2.0000 | INHALATION_SPRAY | Freq: Four times a day (QID) | RESPIRATORY_TRACT | 2 refills | Status: AC | PRN

## 2018-09-01 NOTE — Progress Notes (Signed)
Subjective:    Patient ID: Lauren Case, female    DOB: 04/12/1966, 52 y.o.   MRN: [id number]  HPI  Pt in for follow up.  She had screening cxr due to history of smoking. Recently her cough has been controlled and no obvious allergy symptom flare. Only occasional sneezing. Pt on zyal, astelin and flonase.  Pt is still smoking but only 6 tabs a day. Wellbutrin has helped her stop smoking. She wants to quite smoking completley.  Pt never had psv 23 vaccine.  Your bp is well controlled today. No cardiac or neurologic signs or symptoms.   Review of Systems  Constitutional: Negative for chills, fatigue and fever.  HENT: Positive for sneezing. Negative for congestion, drooling, sore throat, trouble swallowing and voice change.        Mild sneezing.  Respiratory: Positive for cough. Negative for chest tightness, shortness of breath and wheezing.        Still occasional cough but more rare.   Cardiovascular: Negative for chest pain and palpitations.  Gastrointestinal: Negative for abdominal pain.  Musculoskeletal: Negative for back pain.  Skin: Negative for rash.  Neurological: Negative for dizziness, light-headedness and headaches.  Hematological: Negative for adenopathy. Does not bruise/bleed easily.  Psychiatric/Behavioral: Negative for behavioral problems, confusion and dysphoric mood. The patient is not nervous/anxious.     Past Medical History:  Diagnosis Date  . Chicken pox   . Environmental allergies   . GERD (gastroesophageal reflux disease)   . Hypertension   . Measles   . Mumps   . Seasonal allergies      Social History   Socioeconomic History  . Marital status: Single    Spouse name: Not on file  . Number of children: Not on file  . Years of education: Not on file  . Highest education level: Not on file  Occupational History  . Not on file  Social Needs  . Financial resource strain: Not on file  . Food insecurity:    Worry: Not on file   Inability: Not on file  . Transportation needs:    Medical: Not on file    Non-medical: Not on file  Tobacco Use  . Smoking status: Current Every Day Smoker    Packs/day: 1.00    Years: 30.00    Pack years: 30.00  . Smokeless tobacco: Never Used  Substance and Sexual Activity  . Alcohol use: Yes    Alcohol/week: 10.0 standard drinks    Types: 10 Shots of liquor per week    Comment: daily  . Drug use: No  . Sexual activity: Never    Birth control/protection: Surgical  Lifestyle  . Physical activity:    Days per week: Not on file    Minutes per session: Not on file  . Stress: Not on file  Relationships  . Social connections:    Talks on phone: Not on file    Gets together: Not on file    Attends religious service: Not on file    Active member of club or organization: Not on file    Attends meetings of clubs or organizations: Not on file    Relationship status: Not on file  . Intimate partner violence:    Fear of current or ex partner: Not on file    Emotionally abused: Not on file    Physically abused: Not on file    Forced sexual activity: Not on file  Other Topics Concern  . Not on file  Social History Narrative  . Not on file    Past Surgical History:  Procedure Laterality Date  . ESSURE TUBAL LIGATION    . TONSILLECTOMY  1984  . WISDOM TOOTH EXTRACTION      Family History  Problem Relation Age of Onset  . Healthy Mother        Living  . Healthy Father        Living  . Diabetes Maternal Grandfather        Borderline  . COPD Maternal Aunt   . COPD Maternal Uncle   . Alzheimer's disease Maternal Grandmother   . Alzheimer's disease Paternal Grandfather   . Healthy Brother        x1  . Allergies Son        x1    Allergies  Allergen Reactions  . Other Anaphylaxis    Truffle Oils  . Penicillins Anaphylaxis    Current Outpatient Medications on File Prior to Visit  Medication Sig Dispense Refill  . amLODipine (NORVASC) 10 MG tablet Take 1 tablet  (10 mg total) by mouth daily. 90 tablet 3  . azelastine (ASTELIN) 0.1 % nasal spray Place 2 sprays into both nostrils 2 (two) times daily. Use in each nostril as directed 30 mL 3  . buPROPion (WELLBUTRIN XL) 150 MG 24 hr tablet TAKE 1 TABLET BY MOUTH EVERY DAY 30 tablet 2  . cetirizine (ZYRTEC) 10 MG tablet Take 10 mg by mouth daily.    . clonazePAM (KLONOPIN) 0.5 MG tablet Take 1 tablet (0.5 mg total) by mouth 2 (two) times daily as needed for anxiety. 14 tablet 0  . FLUoxetine (PROZAC) 20 MG capsule TAKE 3 CAPSULES BY MOUTH DAILY 90 capsule 2  . fluticasone (FLONASE) 50 MCG/ACT nasal spray Place 2 sprays into both nostrils daily. 16 g 2  . levocetirizine (XYZAL) 5 MG tablet Take 1 tablet (5 mg total) by mouth every evening. 30 tablet 3  . losartan (COZAAR) 100 MG tablet TAKE 1 TABLET BY MOUTH EVERY DAY 30 tablet 2  . omeprazole (PRILOSEC) 20 MG capsule TAKE 1 CAPSULE BY MOUTH EVERY DAY 90 capsule 0  . tiZANidine (ZANAFLEX) 4 MG tablet Take 1 tablet (4 mg total) by mouth at bedtime. 15 tablet 0  . traZODone (DESYREL) 50 MG tablet Take 0.5 tablets (25 mg total) by mouth at bedtime as needed for sleep. 30 tablet 1   No current facility-administered medications on file prior to visit.     BP 130/74   Pulse 72   Temp 98.1 F (36.7 C) (Oral)   Resp 16   Ht 5\' 4"  (1.626 m)   Wt 192 lb 4.8 oz (87.2 kg)   SpO2 97%   BMI 33.01 kg/m       Objective:   Physical Exam  General Mental Status- Alert. General Appearance- Not in acute distress.   Skin General: Color- Normal Color. Moisture- Normal Moisture.  Neck Carotid Arteries- Normal color. Moisture- Normal Moisture. No carotid bruits. No JVD.  Chest and Lung Exam Auscultation: Breath Sounds:-Normal.  Cardiovascular Auscultation:Rythm- Regular. Murmurs & Other Heart Sounds:Auscultation of the heart reveals- No Murmurs.   Neurologic Cranial Nerve exam:- CN III-XII intact(No nystagmus), symmetric smile. Strength:- 5/5 equal and  symmetric strength both upper and lower extremities.   HEENT Head- Normal. Ear Auditory Canal - Left- Normal. Right - Normal.Tympanic Membrane- Left- Normal. Right- Normal. Eye Sclera/Conjunctiva- Left- Normal. Right- Normal. Nose & Sinuses Nasal Mucosa- Left-  Boggy and Congested. Right-  Boggy  and  Congested.Bilateral no maxillary and no  frontal sinus pressure. Mouth & Throat Lips: Upper Lip- Normal: no dryness, cracking, pallor, cyanosis, or vesicular eruption. Lower Lip-Normal: no dryness, cracking, pallor, cyanosis or vesicular eruption. Buccal Mucosa- Bilateral- No Aphthous ulcers. Oropharynx- No Discharge or Erythema. Tonsils: Characteristics- Bilateral- No Erythema or Congestion. Size/Enlargement- Bilateral- No enlargement. Discharge- bilateral-None.         Assessment & Plan:  Your blood pressure is well controlled today.  Continue current BP medication regimen.  Your cholesterol has been high in the past and you have been taking Zetia.  I am putting in future metabolic panel and lipid panel.  Please get that scheduled within the next week.  For history of smoking, I want you to continue with the Wellbutrin and I wrote you for nicotine patches today.  Try to use those and to taper off of the remaining 6 cigarettes a day.  For allergic rhinitis continue with your antihistamine and nasal spray your regimen.  We reviewed x-ray findings today.  I will go ahead and send in albuterol inhaler make that available in the event that you have any wheezing.  Follow-up date to be determined after lab review.  Mackie Pai, PA-C

## 2018-09-01 NOTE — Patient Instructions (Addendum)
Your blood pressure is well controlled today.  Continue current BP medication regimen.  Your cholesterol has been high in the past and you have been taking Zetia.  I am putting in future metabolic panel and lipid panel.  Please get that scheduled within the next week.  For history of smoking, I want you to continue with the Wellbutrin and I wrote you for nicotine patches today.  Try to use those and to taper off of the remaining 6 cigarettes a day.  For allergic rhinitis continue with your antihistamine and nasal spray your regimen.  We reviewed x-ray findings today.  I will go ahead and send in albuterol inhaler make that available in the event that you have any wheezing.  Follow-up date to be determined after lab review.

## 2018-09-02 NOTE — Addendum Note (Signed)
Addended by: Hinton Dyer on: 09/02/2018 11:11 AM   Modules accepted: Orders

## 2018-09-07 ENCOUNTER — Other Ambulatory Visit (INDEPENDENT_AMBULATORY_CARE_PROVIDER_SITE_OTHER): Payer: 59

## 2018-09-07 ENCOUNTER — Telehealth: Payer: Self-pay | Admitting: Medical

## 2018-09-07 DIAGNOSIS — E785 Hyperlipidemia, unspecified: Secondary | ICD-10-CM | POA: Diagnosis not present

## 2018-09-07 LAB — COMPREHENSIVE METABOLIC PANEL
ALK PHOS: 73 U/L (ref 39–117)
ALT: 28 U/L (ref 0–35)
AST: 53 U/L — ABNORMAL HIGH (ref 0–37)
Albumin: 3.8 g/dL (ref 3.5–5.2)
BUN: 8 mg/dL (ref 6–23)
CHLORIDE: 101 meq/L (ref 96–112)
CO2: 31 mEq/L (ref 19–32)
Calcium: 9 mg/dL (ref 8.4–10.5)
Creatinine, Ser: 0.69 mg/dL (ref 0.40–1.20)
GFR: 93.94 mL/min (ref >60.00)
GLUCOSE: 73 mg/dL (ref 70–99)
POTASSIUM: 3.3 meq/L — AB (ref 3.5–5.1)
Sodium: 145 mEq/L (ref 135–145)
TOTAL PROTEIN: 6 g/dL (ref 6.0–8.3)
Total Bilirubin: 0.4 mg/dL (ref 0.2–1.2)

## 2018-09-07 LAB — LDL CHOLESTEROL, DIRECT: Direct LDL: 163 mg/dL

## 2018-09-07 LAB — LIPID PANEL
Cholesterol: 232 mg/dL — ABNORMAL HIGH (ref 0–200)
HDL: 52.8 mg/dL (ref >39.00)
NonHDL: 178.77
Total CHOL/HDL Ratio: 4
Triglycerides: 219 mg/dL — ABNORMAL HIGH (ref 0.0–149.0)
VLDL: 43.8 mg/dL — AB (ref 0.0–40.0)

## 2018-09-07 MED ORDER — ROSUVASTATIN CALCIUM 10 MG PO TABS: 10.0000 mg | ORAL_TABLET | Freq: Every day | ORAL | 3 refills | Status: DC

## 2018-09-07 NOTE — Telephone Encounter (Signed)
Rx crestor sent to pt pharmacy. 

## 2018-09-21 ENCOUNTER — Other Ambulatory Visit: Payer: Self-pay | Admitting: Medical

## 2018-09-28 ENCOUNTER — Other Ambulatory Visit: Payer: Self-pay | Admitting: Medical

## 2018-09-28 DIAGNOSIS — K219 Gastro-esophageal reflux disease without esophagitis: Secondary | ICD-10-CM

## 2018-10-08 ENCOUNTER — Other Ambulatory Visit: Payer: Self-pay

## 2018-10-08 ENCOUNTER — Other Ambulatory Visit: Payer: Self-pay | Admitting: Medical

## 2018-10-08 MED ORDER — LOSARTAN POTASSIUM 100 MG PO TABS: 100.0000 mg | ORAL_TABLET | Freq: Every day | ORAL | 2 refills | Status: DC

## 2018-10-08 NOTE — Telephone Encounter (Signed)
Requested medication (s) are due for refill today: yes  Requested medication (s) are on the active medication list: yes  Last refill:  06/30/18  Future visit scheduled: no  Notes to clinic:  Failed protocol, Potassium 3.3    Requested Prescriptions  Pending Prescriptions Disp Refills   losartan (COZAAR) 100 MG tablet 30 tablet 2    Sig: Take 1 tablet (100 mg total) by mouth daily.     Cardiovascular:  Angiotensin Receptor Blockers Failed - 10/08/2018 11:42 AM      Failed - K in normal range and within 180 days    Potassium  Date Value Ref Range Status  09/07/2018 3.3 (L) 3.5 - 5.1 mEq/L Final         Passed - Cr in normal range and within 180 days    Creat  Date Value Ref Range Status  02/23/2014 0.65 0.50 - 1.10 mg/dL Final   Creatinine, Ser  Date Value Ref Range Status  09/07/2018 0.69 0.40 - 1.20 mg/dL Final         Passed - Patient is not pregnant      Passed - Last BP in normal range    BP Readings from Last 1 Encounters:  09/01/18 130/74         Passed - Valid encounter within last 6 months    Recent Outpatient Visits          1 month ago [place] [facility] [place], [place]   4 months ago [facility]   [facility] [place], [place]   6 months ago Ess[facility]   [facility] [place], [place]   1 year ago Ess[facility] hypertension   [facility] at [place], [place]   1 year ago Essential hypertension   [facility] at [place], [place]

## 2018-10-08 NOTE — Telephone Encounter (Signed)
Copied from [place] [id number]. Topic: Quick Communication - Rx Refill/Question >> Oct 08, 2018 10:48 AM Bea Graff, NT wrote: Medication: losartan (COZAAR) 100 MG tablet  Has the patient contacted their pharmacy? Yes.   (Agent: If no, request that the patient contact the pharmacy for the refill.) (Agent: If yes, when and what did the pharmacy advise?)  Preferred Pharmacy (with phone number or street name): [place], [place] - [address] [phone] (Phone) [phone] (Fax)    Agent: Please be advised that RX refills may take up to 3 business days. We ask that you follow-up with your pharmacy.

## 2018-10-14 ENCOUNTER — Other Ambulatory Visit: Payer: Self-pay | Admitting: Medical

## 2018-11-22 ENCOUNTER — Other Ambulatory Visit: Payer: Self-pay | Admitting: Medical

## 2018-11-24 ENCOUNTER — Other Ambulatory Visit: Payer: Self-pay | Admitting: Medical

## 2018-12-13 ENCOUNTER — Other Ambulatory Visit: Payer: Self-pay | Admitting: Medical

## 2018-12-13 DIAGNOSIS — K219 Gastro-esophageal reflux disease without esophagitis: Secondary | ICD-10-CM

## 2019-01-12 ENCOUNTER — Other Ambulatory Visit: Payer: Self-pay | Admitting: Medical

## 2019-02-21 ENCOUNTER — Other Ambulatory Visit: Payer: Self-pay | Admitting: Medical

## 2019-02-22 ENCOUNTER — Encounter: Payer: Self-pay | Admitting: Medical

## 2019-02-22 ENCOUNTER — Ambulatory Visit (INDEPENDENT_AMBULATORY_CARE_PROVIDER_SITE_OTHER): Payer: 59 | Admitting: Medical

## 2019-02-22 ENCOUNTER — Other Ambulatory Visit: Payer: Self-pay

## 2019-02-22 VITALS — BP 130/85

## 2019-02-22 DIAGNOSIS — I10 Essential (primary) hypertension: Secondary | ICD-10-CM

## 2019-02-22 DIAGNOSIS — F17200 Nicotine dependence, unspecified, uncomplicated: Secondary | ICD-10-CM | POA: Diagnosis not present

## 2019-02-22 DIAGNOSIS — J309 Allergic rhinitis, unspecified: Secondary | ICD-10-CM | POA: Diagnosis not present

## 2019-02-22 DIAGNOSIS — E785 Hyperlipidemia, unspecified: Secondary | ICD-10-CM

## 2019-02-22 MED ORDER — FLUOXETINE HCL 20 MG PO CAPS: 60.0000 mg | ORAL_CAPSULE | Freq: Every day | ORAL | 2 refills | Status: DC

## 2019-02-22 NOTE — Patient Instructions (Addendum)
Your bp is well controlled recently. Continue on current amlodipine and losartan regimen.  For high cholesterol continue on crestor. Will get lipid panel at end of June on office visit.  For smoking cessation continue wellbutrin. Good job on reducing smoking to only 4 cigarettes a day.  For ocd continue prozac. Rx refilled.  Follow up end of June or sooner if needed

## 2019-02-22 NOTE — Progress Notes (Signed)
   Subjective:    Patient ID: Lauren Case, female    DOB: 1966-09-03, 53 y.o.   MRN: [id number]  HPI Virtual Visit via Video Note  I connected with Lauren Sellers Renstrom on 02/22/19 at 10:00 AM EDT by a video enabled telemedicine application and verified that I am speaking with the correct person using two identifiers.   I discussed the limitations of evaluation and management by telemedicine and the availability of in person appointments. The patient expressed understanding and agreed to proceed.  Pt is doing visit from home and I am from home,   History of Present Illness:  Pt working at home for 2 weeks and then she got furloughed.  Pt bp was 130/85 just recently. No cardiac or neurologic signs or symptoms. Pt is amlodipine and losartan.   Pt has been walking her dogs.   Pt allergies are undercontrol.  Pt has been on wellbutrin for smoking cessation. But not using nicotine patches presently. Pt now only smoking for a day or less. Before was full pack a day.  Pt has not had any wheezing recently.  Pt needs refill of prozac for ocd. She states it has been controlled.     Observations/Objective: No acute distress. Pleasant.    Assessment and Plan: Your bp is well controlled recently. Continue on current amlodipine and losartan regimen.  For high cholesterol continue on crestor. Will get lipid panel at end of June on office visit.  For smoking cessation continue wellbutrin. Good job on reducing smoking to only 4 cigarettes a day.  For ocd continue prozac. Rx refilled.  Follow up end of June or sooner if needed  Follow Up Instructions:    I discussed the assessment and treatment plan with the patient. The patient was provided an opportunity to ask questions and all were answered. The patient agreed with the plan and demonstrated an understanding of the instructions.   The patient was advised to call back or seek an in-person evaluation if the symptoms worsen  or if the condition fails to improve as anticipated.     Mackie Pai, PA-C    Review of Systems See hpi.    Objective:   Physical Exam  See objective.      Assessment & Plan:

## 2019-03-12 ENCOUNTER — Other Ambulatory Visit: Payer: Self-pay | Admitting: Medical

## 2019-03-21 ENCOUNTER — Other Ambulatory Visit: Payer: Self-pay | Admitting: Medical

## 2019-03-23 ENCOUNTER — Other Ambulatory Visit: Payer: Self-pay | Admitting: Medical

## 2019-03-23 DIAGNOSIS — K219 Gastro-esophageal reflux disease without esophagitis: Secondary | ICD-10-CM

## 2019-04-19 ENCOUNTER — Other Ambulatory Visit: Payer: Self-pay

## 2019-04-19 ENCOUNTER — Encounter: Payer: 59 | Admitting: Medical

## 2019-04-20 ENCOUNTER — Ambulatory Visit: Payer: 59 | Admitting: Medical

## 2019-04-21 ENCOUNTER — Other Ambulatory Visit: Payer: Self-pay

## 2019-04-21 ENCOUNTER — Ambulatory Visit: Payer: 59 | Admitting: Medical

## 2019-04-21 ENCOUNTER — Encounter: Payer: Self-pay | Admitting: Medical

## 2019-04-21 ENCOUNTER — Telehealth: Payer: Self-pay | Admitting: Medical

## 2019-04-21 VITALS — BP 133/80 | HR 73 | Temp 98.1°F | Resp 16 | Ht 64.0 in | Wt 184.6 lb

## 2019-04-21 DIAGNOSIS — I10 Essential (primary) hypertension: Secondary | ICD-10-CM

## 2019-04-21 DIAGNOSIS — Z1211 Encounter for screening for malignant neoplasm of colon: Secondary | ICD-10-CM

## 2019-04-21 DIAGNOSIS — E785 Hyperlipidemia, unspecified: Secondary | ICD-10-CM | POA: Diagnosis not present

## 2019-04-21 DIAGNOSIS — Z124 Encounter for screening for malignant neoplasm of cervix: Secondary | ICD-10-CM

## 2019-04-21 DIAGNOSIS — Z113 Encounter for screening for infections with a predominantly sexual mode of transmission: Secondary | ICD-10-CM

## 2019-04-21 DIAGNOSIS — F17200 Nicotine dependence, unspecified, uncomplicated: Secondary | ICD-10-CM

## 2019-04-21 LAB — LIPID PANEL
Cholesterol: 231 mg/dL — ABNORMAL HIGH (ref 0–200)
HDL: 52.5 mg/dL (ref >39.00)
LDL Cholesterol: 147 mg/dL — ABNORMAL HIGH (ref 0–99)
NonHDL: 178.56
Total CHOL/HDL Ratio: 4
Triglycerides: 159 mg/dL — ABNORMAL HIGH (ref 0.0–149.0)
VLDL: 31.8 mg/dL (ref 0.0–40.0)

## 2019-04-21 LAB — COMPREHENSIVE METABOLIC PANEL
ALT: 56 U/L — ABNORMAL HIGH (ref 0–35)
AST: 100 U/L — ABNORMAL HIGH (ref 0–37)
Albumin: 4.2 g/dL (ref 3.5–5.2)
Alkaline Phosphatase: 94 U/L (ref 39–117)
BUN: 9 mg/dL (ref 6–23)
CO2: 33 mEq/L — ABNORMAL HIGH (ref 19–32)
Calcium: 9.4 mg/dL (ref 8.4–10.5)
Chloride: 97 mEq/L (ref 96–112)
Creatinine, Ser: 0.66 mg/dL (ref 0.40–1.20)
GFR: 92.83 mL/min (ref >60.00)
Glucose, Bld: 88 mg/dL (ref 70–99)
Potassium: 3.2 mEq/L — ABNORMAL LOW (ref 3.5–5.1)
Sodium: 142 mEq/L (ref 135–145)
Total Bilirubin: 0.4 mg/dL (ref 0.2–1.2)
Total Protein: 6.4 g/dL (ref 6.0–8.3)

## 2019-04-21 MED ORDER — EZETIMIBE 10 MG PO TABS: 10.0000 mg | ORAL_TABLET | Freq: Every day | ORAL | 3 refills | Status: AC

## 2019-04-21 NOTE — Patient Instructions (Addendum)
For htn, continue current meds.  For high cholesterol, may rx crestor after lab review. Cmp with lipid panel today.  For smoking cessation continue on wellbutrin.  For ocd continue on paxil.  Referral for colon cancer screen and cervical cancer screen placed today.  Follow up 3 months or as needed

## 2019-04-21 NOTE — Progress Notes (Signed)
Subjective:    Patient ID: Lauren Case, female    DOB: November 19, 1965, 53 y.o.   MRN: [id number]  HPI  Pt in today for follow up on htn. She is on amlodipine and losartan. No current cardiac or neurologic signs or symptoms.  Hx of high cholesterol. Was on crestor. She stopped possibly due to insurance issue. Wanted her to get cmp and lipid panel fasting today.  Pt has hx of smoking. On last visit she reported some success with wellbutrin as she had cut down to only 4 cigarettes a day.  She has ocd and reports controlled with prozac  Per epic she is overdue for colonsocpy. Also due for pap per epic.    Review of Systems  Constitutional: Negative for chills and fever.  HENT: Negative for dental problem.   Respiratory: Negative for cough, chest tightness, shortness of breath and wheezing.   Cardiovascular: Negative for chest pain and palpitations.  Gastrointestinal: Negative for abdominal pain, blood in stool, diarrhea, nausea and vomiting.  Musculoskeletal: Negative for back pain.  Skin: Negative for rash.  Neurological: Negative for dizziness, syncope, speech difficulty, weakness, light-headedness, numbness and headaches.  Psychiatric/Behavioral: Negative for behavioral problems, confusion, dysphoric mood, sleep disturbance and suicidal ideas. The patient is not nervous/anxious.        Ocd controlled.    Past Medical History:  Diagnosis Date  . Chicken pox   . Environmental allergies   . GERD (gastroesophageal reflux disease)   . Hypertension   . Measles   . Mumps   . Seasonal allergies      Social History   Socioeconomic History  . Marital status: Single    Spouse name: Not on file  . Number of children: Not on file  . Years of education: Not on file  . Highest education level: Not on file  Occupational History  . Not on file  Social Needs  . Financial resource strain: Not on file  . Food insecurity    Worry: Not on file    Inability: Not on file  .  Transportation needs    Medical: Not on file    Non-medical: Not on file  Tobacco Use  . Smoking status: Current Every Day Smoker    Packs/day: 1.00    Years: 30.00    Pack years: 30.00  . Smokeless tobacco: Never Used  Substance and Sexual Activity  . Alcohol use: Yes    Alcohol/week: 10.0 standard drinks    Types: 10 Shots of liquor per week    Comment: daily  . Drug use: No  . Sexual activity: Never    Birth control/protection: Surgical  Lifestyle  . Physical activity    Days per week: Not on file    Minutes per session: Not on file  . Stress: Not on file  Relationships  . Social Herbalist on phone: Not on file    Gets together: Not on file    Attends religious service: Not on file    Active member of club or organization: Not on file    Attends meetings of clubs or organizations: Not on file    Relationship status: Not on file  . Intimate partner violence    Fear of current or ex partner: Not on file    Emotionally abused: Not on file    Physically abused: Not on file    Forced sexual activity: Not on file  Other Topics Concern  . Not on file  Social History Narrative  . Not on file    Past Surgical History:  Procedure Laterality Date  . ESSURE TUBAL LIGATION    . TONSILLECTOMY  1984  . WISDOM TOOTH EXTRACTION      Family History  Problem Relation Age of Onset  . Healthy Mother        Living  . Healthy Father        Living  . Diabetes Maternal Grandfather        Borderline  . COPD Maternal Aunt   . COPD Maternal Uncle   . Alzheimer's disease Maternal Grandmother   . Alzheimer's disease Paternal Grandfather   . Healthy Brother        x1  . Allergies Son        x1    Allergies  Allergen Reactions  . Other Anaphylaxis    Truffle Oils  . Penicillins Anaphylaxis    Current Outpatient Medications on File Prior to Visit  Medication Sig Dispense Refill  . albuterol (PROVENTIL HFA;VENTOLIN HFA) 108 (90 Base) MCG/ACT inhaler Inhale 2  puffs into the lungs every 6 (six) hours as needed for wheezing or shortness of breath. 1 Inhaler 2  . amLODipine (NORVASC) 10 MG tablet Take 1 tablet (10 mg total) by mouth daily. 90 tablet 1  . azelastine (ASTELIN) 0.1 % nasal spray Place 2 sprays into both nostrils 2 (two) times daily. Use in each nostril as directed 30 mL 3  . buPROPion (WELLBUTRIN XL) 150 MG 24 hr tablet TAKE 1 TABLET BY MOUTH EVERY DAY 30 tablet 2  . cetirizine (ZYRTEC) 10 MG tablet Take 10 mg by mouth daily.    . clonazePAM (KLONOPIN) 0.5 MG tablet Take 1 tablet (0.5 mg total) by mouth 2 (two) times daily as needed for anxiety. 14 tablet 0  . FLUoxetine (PROZAC) 20 MG capsule TAKE 3 CAPSULES BY MOUTH DAILY 90 capsule 2  . FLUoxetine (PROZAC) 20 MG capsule Take 3 capsules (60 mg total) by mouth daily. 90 capsule 2  . fluticasone (FLONASE) 50 MCG/ACT nasal spray Place 2 sprays into both nostrils daily. 16 g 2  . levocetirizine (XYZAL) 5 MG tablet Take 1 tablet (5 mg total) by mouth every evening. 30 tablet 3  . losartan (COZAAR) 100 MG tablet TAKE 1 TABLET BY MOUTH EVERY DAY 30 tablet 2  . nicotine (NICODERM CQ - DOSED IN MG/24 HR) 7 mg/24hr patch Place 1 patch (7 mg total) onto the skin daily. 28 patch 0  . omeprazole (PRILOSEC) 20 MG capsule TAKE 1 CAPSULE BY MOUTH DAILY FOR ACID REFLUX 90 capsule 0  . rosuvastatin (CRESTOR) 10 MG tablet Take 1 tablet (10 mg total) by mouth daily. 30 tablet 3  . tiZANidine (ZANAFLEX) 4 MG tablet Take 1 tablet (4 mg total) by mouth at bedtime. 15 tablet 0  . traZODone (DESYREL) 50 MG tablet Take 0.5 tablets (25 mg total) by mouth at bedtime as needed for sleep. 30 tablet 1   No current facility-administered medications on file prior to visit.     There were no vitals taken for this visit.      Objective:   Physical Exam  General Mental Status- Alert. General Appearance- Not in acute distress.   Skin General: Color- Normal Color. Moisture- Normal Moisture.  Neck Carotid  Arteries- Normal color. Moisture- Normal Moisture. No carotid bruits. No JVD.  Chest and Lung Exam Auscultation: Breath Sounds:-Normal.  Cardiovascular Auscultation:Rythm- Regular. Murmurs & Other Heart Sounds:Auscultation of the heart reveals- No  Murmurs.  Abdomen Inspection:-Inspeection Normal. Palpation/Percussion:Note:No mass. Palpation and Percussion of the abdomen reveal- Non Tender, Non Distended + BS, no rebound or guarding.  Neurologic Cranial Nerve exam:- CN III-XII intact(No nystagmus), symmetric smile. Strength:- 5/5 equal and symmetric strength both upper and lower extremities.  Lower ext- no pedal edema.      Assessment & Plan:  For htn, continue current meds.  For high cholesterol,  may rx crestor after lab review. Cmp with lipid panel today.  For smoking cessation continue on wellbutrin.  For ocd continue on paxil.  Referral for colon cancer screen and cervical cancer screen placed today.  Follow up 3 months or as needed    [employer], [employer]

## 2019-04-21 NOTE — Telephone Encounter (Signed)
Rx zetia sent to pharmacy.

## 2019-04-22 ENCOUNTER — Ambulatory Visit: Payer: 59 | Admitting: Medical

## 2019-04-22 LAB — HIV ANTIBODY (ROUTINE TESTING W REFLEX): HIV 1&2 Ab, 4th Generation: NONREACTIVE

## 2019-06-01 ENCOUNTER — Other Ambulatory Visit: Payer: Self-pay | Admitting: Medical

## 2019-06-13 ENCOUNTER — Other Ambulatory Visit: Payer: Self-pay

## 2019-06-13 ENCOUNTER — Ambulatory Visit (INDEPENDENT_AMBULATORY_CARE_PROVIDER_SITE_OTHER): Payer: 59 | Admitting: Obstetrics & Gynecology

## 2019-06-13 ENCOUNTER — Encounter: Payer: Self-pay | Admitting: Obstetrics & Gynecology

## 2019-06-13 VITALS — BP 133/55 | HR 64 | Ht 64.5 in | Wt 180.0 lb

## 2019-06-13 DIAGNOSIS — Z1151 Encounter for screening for human papillomavirus (HPV): Secondary | ICD-10-CM

## 2019-06-13 DIAGNOSIS — Z01419 Encounter for gynecological examination (general) (routine) without abnormal findings: Secondary | ICD-10-CM | POA: Diagnosis not present

## 2019-06-13 DIAGNOSIS — Z124 Encounter for screening for malignant neoplasm of cervix: Secondary | ICD-10-CM | POA: Diagnosis not present

## 2019-06-13 DIAGNOSIS — N951 Menopausal and female climacteric states: Secondary | ICD-10-CM

## 2019-06-13 NOTE — Addendum Note (Signed)
Addended by: Phill Myron on: 06/13/2019 11:57 AM   Modules accepted: Orders

## 2019-06-13 NOTE — Patient Instructions (Signed)
Preventing Osteoporosis, Adult Osteoporosis is a condition that causes the bones to get weaker. With osteoporosis, the bones become thinner, and the normal spaces in bone tissue become larger. This can make the bones weak and cause them to break more easily. People who have osteoporosis are more likely to break their wrist, spine, or hip. Even a minor accident or injury can be enough to break weak bones. Osteoporosis can occur with aging. Your body constantly replaces old bone tissue with new tissue. As you get older, you may lose bone tissue more quickly, or it may be replaced more slowly. Osteoporosis is more likely to develop if you have poor nutrition or do not get enough calcium or vitamin D. Other lifestyle factors can also play a role. By making some diet and lifestyle changes, you can help to keep your bones healthy and help to prevent osteoporosis. What nutrition changes can be made? Nutrition plays an important role in maintaining healthy, strong bones.  Make sure you get enough calcium every day from food or from calcium supplements. ? If you are age 64 or younger, aim to get 1,000 mg of calcium every day. ? If you are older than age 31, aim to get 1,200 mg of calcium every day.  Try to get enough vitamin D every day. ? If you are age 42 or younger, aim to get 600 international units (IU) every day. ? If you are older than age 56, aim to get 800 international units every day.  Follow a healthy diet. Eat plenty of foods that contain calcium and vitamin D. ? Calcium is in milk, cheese, yogurt, and other dairy products. Some fish and vegetables are also good sources of calcium. Many foods such as cereals and breads have had calcium added to them (are fortified). Check nutrition labels to see how much calcium is in a food or drink. ? Foods that contain vitamin D include milk, cereals, salmon, and tuna. Your body also makes vitamin D when you are out in the sun. Bare skin exposure to the sun on  your face, arms, legs, or back for no more than 30 minutes a day, 2 times per week is more than enough. Beyond that, it is important to use sunblock to protect your skin from sunburn, which increases your risk for skin cancer. What lifestyle changes can be made? Making changes in your everyday life can also play an important role in preventing osteoporosis.  Stay active and get exercise every day. Ask your health care provider what types of exercise are best for you.  Do not use any products that contain nicotine or tobacco, such as cigarettes and e-cigarettes. If you need help quitting, ask your health care provider.  Limit alcohol intake to no more than 1 drink a day for nonpregnant women and 2 drinks a day for men. One drink equals 12 oz of beer, 5 oz of wine, or 1 oz of hard liquor. Why are these changes important? Making these nutrition and lifestyle changes can:  Help you develop and maintain healthy, strong bones.  Prevent loss of bone mass and the problems that are caused by that loss, such as broken bones and delayed healing.  Make you feel better mentally and physically. What can happen if changes are not made? Problems that can result from osteoporosis can be very serious. These may include:  A higher risk of broken bones that are painful and do not heal well.  Physical malformations, such as a collapsed spine  or a hunched back.  Problems with movement. Where to find support If you need help making changes to prevent osteoporosis, talk with your health care provider. You can ask for a referral to a diet and nutrition specialist (dietitian) and a physical therapist. Where to find more information Learn more about osteoporosis from:  NIH Osteoporosis and Related [place]: www.niams.GolfingGoddess.com.br  U.S. Office on Women's Health:  SouvenirBaseball.es.html  National Osteoporosis Foundation: ProfilePeek.ch Summary  Osteoporosis is a condition that causes weak bones that are more likely to break.  Eating a healthy diet and making sure you get enough calcium and vitamin D can help prevent osteoporosis.  Other ways to reduce your risk of osteoporosis include getting regular exercise and avoiding alcohol and products that contain nicotine or tobacco. This information is not intended to replace advice given to you by your health care provider. Make sure you discuss any questions you have with your health care provider. Document Released: 11/04/2015 Document Revised: 10/02/2017 Document Reviewed: 06/30/2016 Elsevier Patient Education  2020 [employer].

## 2019-06-13 NOTE — Progress Notes (Signed)
Subjective:     Lauren Case is a 53 y.o. female here for a routine exam. G3P1021 LMP 6 years prev. NO menopausal sx. pCurrent complaints: none. Has an 55 yo at home in his last year of HS. Pt smokes 5 cigs per day. Attempting tobacco cessation.       Gynecologic History No LMP recorded. Patient is postmenopausal. Contraception: post menopausal status Last Pap: 12/03/2015. Results were: normal. h/o abnormal PAP in her 44s with h/o cryo and laser.  Last mammogram: 08/10/2018. Results were: normal  Obstetric History OB History  Gravida Para Term Preterm AB Living  3       1 1   SAB TAB Ectopic Multiple Live Births          1    # Outcome Date GA Lbr Len/2nd Weight Sex Delivery Anes PTL Lv  3 Gravida           2 Molar           1 Gravida             The following portions of the patient's history were reviewed and updated as appropriate: allergies, current medications, past family history, past medical history, past social history, past surgical history and problem list.  Review of Systems Pertinent items are noted in HPI.    Objective:  BP (!) 133/55   Pulse 64   Ht 5' 4.5" (1.638 m)   Wt 180 lb (81.6 kg)   BMI 30.42 kg/m   General Appearance:    Alert, cooperative, no distress, appears stated age  Head:    Normocephalic, without obvious abnormality, atraumatic  Eyes:    conjunctiva/corneas clear, EOM's intact, both eyes  Ears:    Normal external ear canals, both ears  Nose:   Nares normal, septum midline, mucosa normal, no drainage    or sinus tenderness  Throat:   Lips, mucosa, and tongue normal; teeth and gums normal  Neck:   Supple, symmetrical, trachea midline, no adenopathy;    thyroid:  no enlargement/tenderness/nodules  Back:     Symmetric, no curvature, ROM normal, no CVA tenderness  Lungs:     respirations unlabored  Chest Wall:    No tenderness or deformity   Heart:    Regular rate and rhythm  Breast Exam:    No tenderness, masses, or nipple abnormality   Abdomen:     Soft, non-tender, bowel sounds active all four quadrants,    no masses, no organomegaly  Genitalia:    Normal female without lesion, discharge or tenderness     Extremities:   Extremities normal, atraumatic, no cyanosis or edema  Pulses:   2+ and symmetric all extremities  Skin:   Skin color, texture, turgor normal, no rashes. Pt does have bruising on left wrist and left hip.     Assessment:    Healthy female exam.   menopausal state    Plan:    Mammogram ordered. Follow up in: 1 year.    Keep Vit D Add Ca++ 1200mg  daily for bone protection  Jhayla Podgorski L. Harraway-Smith, M.D., Cherlynn June

## 2019-06-14 LAB — CYTOLOGY - PAP
Diagnosis: NEGATIVE
HPV: NOT DETECTED

## 2019-06-27 ENCOUNTER — Other Ambulatory Visit: Payer: Self-pay | Admitting: Medical

## 2019-06-27 DIAGNOSIS — K219 Gastro-esophageal reflux disease without esophagitis: Secondary | ICD-10-CM

## 2019-07-22 ENCOUNTER — Ambulatory Visit: Payer: 59 | Admitting: Medical

## 2019-07-27 ENCOUNTER — Encounter: Payer: Self-pay | Admitting: Medical

## 2019-07-27 ENCOUNTER — Other Ambulatory Visit: Payer: Self-pay

## 2019-07-28 ENCOUNTER — Ambulatory Visit: Payer: PRIVATE HEALTH INSURANCE | Admitting: Medical

## 2019-07-28 DIAGNOSIS — Z0289 Encounter for other administrative examinations: Secondary | ICD-10-CM

## 2019-08-05 ENCOUNTER — Ambulatory Visit: Payer: Self-pay | Admitting: *Deleted

## 2019-08-05 NOTE — Telephone Encounter (Signed)
Pt calling with complaints of a blister like red rash that started a month ago. Little blisters on the palm of bilateral hands, soles up feet and up to bilateral knees.Marland Kitchen Hx of psoriasis. Pt thinks it may be scabies. No known scabies exposure. Pt stated that she developed the rash after traveling to the beach with family on [place].Pt schedule for appt at Sat clinic. Pt advised to seek treatment at Urgent care if worsening symptoms develop.Understanding verbalized.   Reason for Disposition . Mild widespread rash  Answer Assessment - Initial Assessment Questions 1. PLACE OF EXPOSURE: "Where were you when you were exposed?" (e.g., home, work)   No known exposure 2. TYPE OF EXPOSURE: "How were you exposed?" "How much contact was there?"     Not at home 3. DATE OF EXPOSURE: "When did the exposure occur?" (e.g., days)     No known exposure 4. SYMPTOMS: "Do you have any symptoms?" (e.g., itching, bumpy rash)  If Yes: "What does it look like?" "Which parts of your body are affected?"     Itchy, not a pimple, looks like  5. PRIOR HISTORY: "Have you ever had scabies before?"     No  6. CLOSE CONTACTS: "Does any person who lives with you or has had close contact with you have itching?"     no  Answer Assessment - Initial Assessment Questions 1. APPEARANCE of RASH: "Describe the rash." (e.g., spots, blisters, raised areas, skin peeling, scaly)     Blister like bumps, not pimples nothing in them 2. SIZE: "How big are the spots?" (e.g., tip of pen, eraser, coin; inches, centimeters)     Pin point spots 3. LOCATION: "Where is the rash located?"     On hands, soles of feet that travels up to knee 4. COLOR: "What color is the rash?" (Note: It is difficult to assess rash color in people with darker-colored skin. When this situation occurs, simply ask the caller to describe what they see.)     Red pin  5. ONSET: "When did the rash begin?"     A month ago around Labor day after going to the beach 6.  FEVER: "Do you have a fever?" If so, ask: "What is your temperature, how was it measured, and when did it start?"     No 7. ITCHING: "Does the rash itch?" If so, ask: "How bad is the itch?" (Scale 1-10; or mild, moderate, severe)     Yes it itches 8. CAUSE: "What do you think is causing the rash?"     Scabies, but no known exposure 9. MEDICATION FACTORS: "Have you started any new medications within the last 2 weeks?" (e.g., antibiotics)      No 10. OTHER SYMPTOMS: "Do you have any other symptoms?" (e.g., dizziness, headache, sore throat, joint pain)       No 11. PREGNANCY: "Is there any chance you are pregnant?" "When was your last menstrual period?"       Not assessed  Protocols used: RASH OR REDNESS - WIDESPREAD-A-AH, SCABIES EXPOSURE-A-AH

## 2019-08-06 ENCOUNTER — Other Ambulatory Visit: Payer: Self-pay

## 2019-08-06 ENCOUNTER — Ambulatory Visit (INDEPENDENT_AMBULATORY_CARE_PROVIDER_SITE_OTHER): Payer: PRIVATE HEALTH INSURANCE | Admitting: Family Medicine

## 2019-08-06 DIAGNOSIS — B86 Scabies: Secondary | ICD-10-CM

## 2019-08-06 DIAGNOSIS — L409 Psoriasis, unspecified: Secondary | ICD-10-CM | POA: Diagnosis not present

## 2019-08-06 MED ORDER — CLOBETASOL PROPIONATE 0.05 % EX OINT: 1.0000 "application " | TOPICAL_OINTMENT | Freq: Two times a day (BID) | CUTANEOUS | 0 refills | Status: DC

## 2019-08-06 MED ORDER — PERMETHRIN 5 % EX CREA: 1.0000 "application " | TOPICAL_CREAM | Freq: Once | CUTANEOUS | 1 refills | Status: AC

## 2019-08-06 NOTE — Progress Notes (Signed)
Virtual Visit via Video Note  I connected with Lauren Case  on 08/06/19 at  9:20 AM EDT by a video enabled telemedicine application and verified that I am speaking with the correct person using two identifiers.  Location patient: home Location provider:work or home office Persons participating in the virtual visit: patient, provider  I discussed the limitations of evaluation and management by telemedicine and the availability of in person appointments. The patient expressed understanding and agreed to proceed.   Lauren Case DOB: 08/23/1966 Encounter date: 08/06/2019  This is a 53 y.o. female who presents with No chief complaint on file.   History of present illness:  [place] the beach on labor [place]. Since then has had little things coming up on palm of hand, thumb. Look like blisters and they itch, but not horrible. Then noticed on bottoms of feet. Look like little blisters. Has some areas that are coming up to upper thigh. No one around her is having rash/itching. This is very different than her regular psoriasis. Nothing on head, neck, face.     Allergies  Allergen Reactions  . Other Anaphylaxis    Truffle Oils  . Penicillins Anaphylaxis   No outpatient medications have been marked as taking for the 08/06/19 encounter (Office Visit) with Lauren Macadam, MD.    Review of Systems  Constitutional: Negative for chills, fatigue and fever.  Respiratory: Negative for cough, chest tightness, shortness of breath and wheezing.   Cardiovascular: Negative for chest pain, palpitations and leg swelling.  Skin: Positive for rash.    Objective:  There were no vitals taken for this visit.      BP Readings from Last 3 Encounters:  06/13/19 (!) 133/55  04/21/19 133/80  02/22/19 130/85   Wt Readings from Last 3 Encounters:  06/13/19 180 lb (81.6 kg)  04/21/19 184 lb 9.6 oz (83.7 kg)  09/01/18 192 lb 4.8 oz (87.2 kg)    EXAM:  GENERAL: alert, oriented, appears  well and in no acute distress  HEENT: atraumatic, conjunctiva clear, no obvious abnormalities on inspection of external nose and ears  NECK: normal movements of the head and neck  LUNGS: on inspection no signs of respiratory distress, breathing rate appears normal, no obvious gross SOB, gasping or wheezing  CV: no obvious cyanosis  MS: moves all visible extremities without noticeable abnormality  PSYCH/NEURO: pleasant and cooperative, no obvious depression or anxiety, speech and thought processing grossly intact  SKIN: Although video feed makes full assessment somewhat difficult, she does have erythematous papules scattered across wrist and noted more prominently around thumb and near her thumb webbing.  Additionally has erythematous papules around ankle and into webbing.  This does not extend to the soles of her feet, but approaches edge.  She also has notable psoriatic plaques around ankles with scaling and slight erythema.  Assessment/Plan  1. Scabies Evaluation via video does seem consistent with scabies infection.  I instructed her that I will put some patient information on her visit summary that she can access online to get more details about washing bedding, preventing spread, etc.  She will do 1 treatment today.  She has a follow-up already scheduled with her primary prior to the timing she would need to do a 2-week retreatment.  I have encouraged her to let us know if any worsening of rash or symptoms (but did caution that itching can intensify after treatment). - permethrin (ELIMITE) 5 % cream; Apply 1 application topically once for 1 dose.  Dispense: 60 g; Refill: 1  2. Psoriasis She is not currently using any topical treatment for psoriasis.  She does have some significant plaques on exam today.  I am concerned that there may be some risk for secondary bacterial infection of the itching she has secondary to scabies.  I am going to refill some clobetasol which she has previously  used and tolerated.  I encouraged her to use this sparingly as it is very potent topical steroid. - clobetasol ointment (TEMOVATE) 0.05 %; Apply 1 application topically 2 (two) times daily.  Dispense: 30 g; Refill: 0    Return for has appointment scheduled w PCP.   I discussed the assessment and treatment plan with the patient. The patient was provided an opportunity to ask questions and all were answered. The patient agreed with the plan and demonstrated an understanding of the instructions.   The patient was advised to call back or seek an in-person evaluation if the symptoms worsen or if the condition fails to improve as anticipated.  I provided 10 minutes of non-face-to-face time during this encounter.   Lauren Rough, MD

## 2019-08-06 NOTE — Patient Instructions (Signed)
Scabies, Adult  Scabies is a skin condition that happens when very small insects get under the skin (infestation). This causes a rash and severe itchiness. Scabies can spread from person to person (is contagious). If you get scabies, it is common for others in your household to get scabies too. With proper treatment, symptoms usually go away in 2-4 weeks. Scabies usually does not cause lasting problems. What are the causes? This condition is caused by tiny mites (Sarcoptes scabiei, or human itch mites) that can only be seen with a microscope. The mites get into the top layer of skin and lay eggs. Scabies can spread from person to person through:  Close contact with a person who has scabies.  Sharing or having contact with infested items, such as towels, bedding, or clothing. What increases the risk? The following factors may make you more likely to develop this condition:  Living in a nursing home or other extended care facility.  Having sexual contact with a partner who has scabies.  Caring for others who are at increased risk for scabies. What are the signs or symptoms? Symptoms of this condition include:  Severe itchiness. This is often worse at night.  A rash that includes tiny red bumps or blisters. The rash commonly occurs on the hands, wrists, elbows, armpits, chest, waist, groin, or buttocks. The bumps may form a line (burrow) in some areas.  Skin irritation. This can include scaly patches or sores. How is this diagnosed? This condition may be diagnosed based on:  A physical exam of the skin.  A skin test. Your health care provider may take a sample of your affected skin (skin scraping) and have it examined under a microscope for signs of mites. How is this treated? This condition may be treated with:  Medicated cream or lotion that kills the mites. This is spread on the entire body and left on for several hours. Usually, one treatment with medicated cream or lotion is  enough to kill all the mites. In severe cases, the treatment may need to be repeated.  Medicated cream that relieves itching.  Medicines taken by mouth (orally) that: ? Relieve itching. ? Reduce the swelling and redness. ? Kill the mites. This treatment may be done in severe cases. Follow these instructions at home: Medicines   Take or apply over-the-counter and prescription medicines as told by your health care provider.  Apply medicated cream or lotion as told by your health care provider.  Do not wash off the medicated cream or lotion until the necessary amount of time has passed. Skin care   Avoid scratching the affected areas of your skin.  Keep your fingernails closely trimmed to reduce injury from scratching.  Take cool baths or apply cool washcloths to your skin to help reduce itching. General instructions  Clean all items that you recently had contact with, including bedding, clothing, and furniture. Do this on the same day that you start treatment. ? Dry clean items, or use hot water to wash items. Dry items on the hot dry cycle. ? Place items that cannot be washed into closed, airtight plastic bags for at least 3 days. The mites cannot live for more than 3 days away from human skin. ? Vacuum furniture and mattresses that you use.  Make sure that other people who may have been infested are examined by a health care provider. These include members of your household and anyone who may have had contact with infested items.  Keep all follow-up  visits as told by your health care provider. This is important. Contact a health care provider if:  You have itching that does not go away after 4 weeks of treatment.  You continue to develop new bumps or burrows.  You have redness, swelling, or pain in your rash area after treatment.  You have fluid, blood, or pus coming from your rash. Summary  Scabies is a skin condition that causes a rash and severe itchiness.  This  condition is caused by tiny mites that get into the top layer of the skin and lay eggs.  Scabies can spread from person to person.  Follow treatments as recommended by your health care provider.  Clean all items that you recently had contact with. This information is not intended to replace advice given to you by your health care provider. Make sure you discuss any questions you have with your health care provider. Document Released: 07/11/2015 Document Revised: 08/25/2018 Document Reviewed: 08/25/2018 Elsevier Patient Education  2020 [employer]  

## 2019-08-15 ENCOUNTER — Other Ambulatory Visit: Payer: Self-pay

## 2019-08-16 ENCOUNTER — Encounter: Payer: Self-pay | Admitting: Medical

## 2019-08-16 ENCOUNTER — Ambulatory Visit (INDEPENDENT_AMBULATORY_CARE_PROVIDER_SITE_OTHER): Payer: PRIVATE HEALTH INSURANCE | Admitting: Medical

## 2019-08-16 VITALS — BP 138/80 | HR 78 | Temp 97.6°F | Resp 16 | Ht 64.5 in | Wt 180.2 lb

## 2019-08-16 DIAGNOSIS — R748 Abnormal levels of other serum enzymes: Secondary | ICD-10-CM | POA: Diagnosis not present

## 2019-08-16 DIAGNOSIS — F17200 Nicotine dependence, unspecified, uncomplicated: Secondary | ICD-10-CM | POA: Diagnosis not present

## 2019-08-16 DIAGNOSIS — I10 Essential (primary) hypertension: Secondary | ICD-10-CM

## 2019-08-16 DIAGNOSIS — E785 Hyperlipidemia, unspecified: Secondary | ICD-10-CM

## 2019-08-16 LAB — LIPID PANEL
Cholesterol: 231 mg/dL — ABNORMAL HIGH (ref 0–200)
HDL: 57.9 mg/dL (ref >39.00)
LDL Cholesterol: 146 mg/dL — ABNORMAL HIGH (ref 0–99)
NonHDL: 173.42
Total CHOL/HDL Ratio: 4
Triglycerides: 135 mg/dL (ref 0.0–149.0)
VLDL: 27 mg/dL (ref 0.0–40.0)

## 2019-08-16 LAB — COMPREHENSIVE METABOLIC PANEL
ALT: 63 U/L — ABNORMAL HIGH (ref 0–35)
AST: 114 U/L — ABNORMAL HIGH (ref 0–37)
Albumin: 4.2 g/dL (ref 3.5–5.2)
Alkaline Phosphatase: 105 U/L (ref 39–117)
BUN: 7 mg/dL (ref 6–23)
CO2: 30 mEq/L (ref 19–32)
Calcium: 9.4 mg/dL (ref 8.4–10.5)
Chloride: 99 mEq/L (ref 96–112)
Creatinine, Ser: 0.62 mg/dL (ref 0.40–1.20)
GFR: 99.65 mL/min (ref >60.00)
Glucose, Bld: 87 mg/dL (ref 70–99)
Potassium: 3.3 mEq/L — ABNORMAL LOW (ref 3.5–5.1)
Sodium: 141 mEq/L (ref 135–145)
Total Bilirubin: 0.5 mg/dL (ref 0.2–1.2)
Total Protein: 6.8 g/dL (ref 6.0–8.3)

## 2019-08-16 MED ORDER — LOSARTAN POTASSIUM 100 MG PO TABS: 100.0000 mg | ORAL_TABLET | Freq: Every day | ORAL | 2 refills | Status: DC

## 2019-08-16 MED ORDER — ROSUVASTATIN CALCIUM 10 MG PO TABS: ORAL_TABLET | ORAL | 3 refills | Status: DC

## 2019-08-16 NOTE — Progress Notes (Signed)
Subjective:    Patient ID: Lauren Case, female    DOB: [birth year], 53 y.o.   MRN: [id number]  HPI  Pt in for follow up.  Pt in today for follow up on htn. She is on amlodipine and losartan. No current cardiac or neurologic signs or symptoms. Pt states does not check her blood pressure at home.   Hx of high cholesterol. Was on crestor. She stopped possibly due to insurance issue and she thinks had mild muscle aches(but not severe). Wanted her to get cmp and lipid panel fasting today. Pt also states zetia made her depressed.  Pt has hx of smoking. On last visit she reported some success with wellbutrin as she had cut down to only 4 cigarettes a day.  She has ocd and reports controlled with prozac. Pt saw specialist and thought better than paxil. However pt thinks paxil worked better. She did not have any weight gain. She had moderate withdrawal syndrome when stopped paxil for 4 days and started prozac.  Pt recently had mammogram this past this past Friday. August had papsmear which was negative. Pt has not had colnoscopy scheduled yet but states she will schedule appointment.  Pt smoking now just 3-4 cigarettes a day.  The 10-year ASCVD risk score Mikey Bussing DC Brooke Bonito., et al., 2013) is: 9.6%   Values used to calculate the score:     Age: 53 years     Sex: Female     Is Non-Hispanic African American: No     Diabetic: No     Tobacco smoker: Yes     Systolic Blood Pressure: [id number] mmHg     Is BP treated: Yes     HDL Cholesterol: 52.5 mg/dL     Total Cholesterol: 231 mg/dL    Review of Systems  Constitutional: Negative for chills, fatigue and fever.  Respiratory: Negative for chest tightness, shortness of breath and wheezing.   Cardiovascular: Negative for chest pain and palpitations.  Gastrointestinal: Negative for abdominal pain, constipation, nausea and vomiting.  Musculoskeletal: Negative for back pain and gait problem.  Neurological: Negative for dizziness, speech  difficulty, weakness and headaches.  Hematological: Negative for adenopathy. Does not bruise/bleed easily.  Psychiatric/Behavioral: Negative for behavioral problems and decreased concentration.    Past Medical History:  Diagnosis Date  . Chicken pox   . Environmental allergies   . GERD (gastroesophageal reflux disease)   . Hypertension   . Measles   . Mumps   . Seasonal allergies   . Vaginal Pap smear, abnormal      Social History   Socioeconomic History  . Marital status: Single    Spouse name: Not on file  . Number of children: Not on file  . Years of education: Not on file  . Highest education level: Not on file  Occupational History  . Not on file  Social Needs  . Financial resource strain: Not on file  . Food insecurity    Worry: Not on file    Inability: Not on file  . Transportation needs    Medical: Not on file    Non-medical: Not on file  Tobacco Use  . Smoking status: Current Every Day Smoker    Packs/day: 1.00    Years: 30.00    Pack years: 30.00  . Smokeless tobacco: Never Used  Substance and Sexual Activity  . Alcohol use: Yes    Alcohol/week: 10.0 standard drinks    Types: 10 Shots of liquor per week  Comment: daily  . Drug use: No  . Sexual activity: Never    Birth control/protection: Surgical  Lifestyle  . Physical activity    Days per week: Not on file    Minutes per session: Not on file  . Stress: Not on file  Relationships  . Social Herbalist on phone: Not on file    Gets together: Not on file    Attends religious service: Not on file    Active member of club or organization: Not on file    Attends meetings of clubs or organizations: Not on file    Relationship status: Not on file  . Intimate partner violence    Fear of current or ex partner: Not on file    Emotionally abused: Not on file    Physically abused: Not on file    Forced sexual activity: Not on file  Other Topics Concern  . Not on file  Social History  Narrative  . Not on file    Past Surgical History:  Procedure Laterality Date  . ESSURE TUBAL LIGATION    . TONSILLECTOMY  1984  . WISDOM TOOTH EXTRACTION      Family History  Problem Relation Age of Onset  . Healthy Mother        Living  . Healthy Father        Living  . Diabetes Maternal Grandfather        Borderline  . COPD Maternal Aunt   . COPD Maternal Uncle   . Alzheimer's disease Maternal Grandmother   . Alzheimer's disease Paternal Grandfather   . Healthy Brother        x1  . Allergies Son        x1    Allergies  Allergen Reactions  . Other Anaphylaxis    Truffle Oils  . Penicillins Anaphylaxis    Current Outpatient Medications on File Prior to Visit  Medication Sig Dispense Refill  . albuterol (PROVENTIL HFA;VENTOLIN HFA) 108 (90 Base) MCG/ACT inhaler Inhale 2 puffs into the lungs every 6 (six) hours as needed for wheezing or shortness of breath. 1 Inhaler 2  . amLODipine (NORVASC) 10 MG tablet Take 1 tablet (10 mg total) by mouth daily. 90 tablet 1  . azelastine (ASTELIN) 0.1 % nasal spray Place 2 sprays into both nostrils 2 (two) times daily. Use in each nostril as directed 30 mL 3  . buPROPion (WELLBUTRIN XL) 150 MG 24 hr tablet TAKE 1 TABLET BY MOUTH EVERY DAY 30 tablet 2  . cetirizine (ZYRTEC) 10 MG tablet Take 10 mg by mouth daily.    . clobetasol ointment (TEMOVATE) [id number] % Apply 1 application topically 2 (two) times daily. 30 g 0  . clonazePAM (KLONOPIN) 0.5 MG tablet Take 1 tablet (0.5 mg total) by mouth 2 (two) times daily as needed for anxiety. 14 tablet 0  . ezetimibe (ZETIA) 10 MG tablet Take 1 tablet (10 mg total) by mouth daily. 30 tablet 3  . FLUoxetine (PROZAC) 20 MG capsule TAKE 3 CAPSULES BY MOUTH DAILY 90 capsule 2  . FLUoxetine (PROZAC) 20 MG capsule Take 3 capsules (60 mg total) by mouth daily. 90 capsule 2  . fluticasone (FLONASE) 50 MCG/ACT nasal spray Place 2 sprays into both nostrils daily. 16 g 2  . levocetirizine (XYZAL) 5 MG  tablet Take 1 tablet (5 mg total) by mouth every evening. 30 tablet 3  . nicotine (NICODERM CQ - DOSED IN MG/24 HR) 7 mg/24hr patch Place  1 patch (7 mg total) onto the skin daily. 28 patch 0  . omeprazole (PRILOSEC) 20 MG capsule TAKE 1 CAPSULE BY MOUTH DAILY FOR ACID REFLUX 90 capsule 0  . tiZANidine (ZANAFLEX) 4 MG tablet Take 1 tablet (4 mg total) by mouth at bedtime. 15 tablet 0  . traZODone (DESYREL) 50 MG tablet Take 0.5 tablets (25 mg total) by mouth at bedtime as needed for sleep. 30 tablet 1   No current facility-administered medications on file prior to visit.     There were no vitals taken for this visit.     Objective:   Physical Exam   General Mental Status- Alert. General Appearance- Not in acute distress.   Skin General: Color- Normal Color. Moisture- Normal Moisture.  Neck Carotid Arteries- Normal color. Moisture- Normal Moisture. No carotid bruits. No JVD.  Chest and Lung Exam Auscultation: Breath Sounds:-Normal.  Cardiovascular Auscultation:Rythm- Regular. Murmurs & Other Heart Sounds:Auscultation of the heart reveals- No Murmurs.  Abdomen Inspection:-Inspeection Normal. Palpation/Percussion:Note:No mass. Palpation and Percussion of the abdomen reveal- Non Tender, Non Distended + BS, no rebound or guarding.    Neurologic Cranial Nerve exam:- CN III-XII intact(No nystagmus), symmetric smile. Strength:- 5/5 equal and symmetric strength both upper and lower extremities.     Assessment & Plan:  Your blood pressure is borderline controlled today.  It might be better if you were checking at home rather than in our office.  Do recommend that she get blood pressure cuff over-the-counter and check blood pressure daily when you are relaxed at home.  Continue current BP medication.  Your cholesterol is high and we did go over your 10-year cardiovascular risk score.  You report Zetia gave you side effect of depression.  You do note history of some mild myalgias  with statin.  I do think is reasonable to potentially try just using low-dose Crestor 1 every other day to see how you feel with this regimen.  If myalgias occur with this then stop and notify me.  You do have history of mild elevated liver enzymes.  We will go ahead and repeat metabolic panel to include LFTs today.  You have decrease smoking recently and would encourage you to stop completely as this can affect your cardiovascular risk for.  Follow-up date to be determined after lab review.  25 minutes spent with pt. 50% of time spent counseling pt on plan going forward.  Mackie Pai, PA-C

## 2019-08-16 NOTE — Patient Instructions (Signed)
Your blood pressure is borderline controlled today.  It might be better if you were checking at home rather than in our office.  Do recommend that she get blood pressure cuff over-the-counter and check blood pressure daily when you are relaxed at home.  Continue current BP medication.  Your cholesterol is high and we did go over your 10-year cardiovascular risk score.  You report Zetia gave you side effect of depression.  You do note history of some mild myalgias with statin.  I do think is reasonable to potentially try just using low-dose Crestor 1 every other day to see how you feel with this regimen.  If myalgias occur with this then stop and notify me.  You do have history of mild elevated liver enzymes.  We will go ahead and repeat metabolic panel to include LFTs today.  You have decrease smoking recently and would encourage you to stop completely as this can affect your cardiovascular risk for.  Follow-up date to be determined after lab review.

## 2019-08-17 ENCOUNTER — Telehealth: Payer: Self-pay | Admitting: Medical

## 2019-08-17 DIAGNOSIS — E876 Hypokalemia: Secondary | ICD-10-CM

## 2019-08-17 MED ORDER — POTASSIUM CHLORIDE ER 10 MEQ PO TBCR: 10.0000 meq | EXTENDED_RELEASE_TABLET | Freq: Every day | ORAL | 2 refills | Status: DC

## 2019-08-17 NOTE — Telephone Encounter (Signed)
Future labs placed. 

## 2019-08-23 ENCOUNTER — Other Ambulatory Visit: Payer: Self-pay | Admitting: Medical

## 2019-09-10 ENCOUNTER — Other Ambulatory Visit: Payer: Self-pay | Admitting: Medical

## 2019-09-10 DIAGNOSIS — K219 Gastro-esophageal reflux disease without esophagitis: Secondary | ICD-10-CM

## 2019-09-28 ENCOUNTER — Other Ambulatory Visit: Payer: Self-pay | Admitting: Medical

## 2019-10-07 ENCOUNTER — Other Ambulatory Visit: Payer: Self-pay | Admitting: Medical

## 2019-11-09 ENCOUNTER — Other Ambulatory Visit: Payer: Self-pay | Admitting: Medical

## 2019-11-29 ENCOUNTER — Other Ambulatory Visit: Payer: Self-pay | Admitting: Medical

## 2019-12-10 ENCOUNTER — Other Ambulatory Visit: Payer: Self-pay | Admitting: Medical

## 2019-12-12 NOTE — Telephone Encounter (Signed)
Medication:  buPROPion (WELLBUTRIN SR) 150 MG 12 hr tablet   Has the patient contacted their pharmacy? Yes.    [facility]   [address], [place], [place] [address]    [phone]   Agent: Please be advised that RX refills may take up to 3 business days. We ask that you follow-up with your pharmacy.

## 2019-12-20 ENCOUNTER — Other Ambulatory Visit: Payer: Self-pay | Admitting: Medical

## 2019-12-27 ENCOUNTER — Other Ambulatory Visit: Payer: Self-pay

## 2019-12-28 ENCOUNTER — Ambulatory Visit: Payer: PRIVATE HEALTH INSURANCE | Admitting: Medical

## 2019-12-28 ENCOUNTER — Encounter: Payer: Self-pay | Admitting: Medical

## 2019-12-28 VITALS — BP 122/80 | HR 73 | Temp 96.9°F | Resp 18 | Ht 64.0 in | Wt 188.2 lb

## 2019-12-28 DIAGNOSIS — I10 Essential (primary) hypertension: Secondary | ICD-10-CM | POA: Diagnosis not present

## 2019-12-28 DIAGNOSIS — R748 Abnormal levels of other serum enzymes: Secondary | ICD-10-CM

## 2019-12-28 DIAGNOSIS — F17200 Nicotine dependence, unspecified, uncomplicated: Secondary | ICD-10-CM | POA: Diagnosis not present

## 2019-12-28 DIAGNOSIS — E785 Hyperlipidemia, unspecified: Secondary | ICD-10-CM

## 2019-12-28 LAB — COMPREHENSIVE METABOLIC PANEL
ALT: 48 U/L — ABNORMAL HIGH (ref 0–35)
AST: 69 U/L — ABNORMAL HIGH (ref 0–37)
Albumin: 4.2 g/dL (ref 3.5–5.2)
Alkaline Phosphatase: 107 U/L (ref 39–117)
BUN: 9 mg/dL (ref 6–23)
CO2: 32 mEq/L (ref 19–32)
Calcium: 9.2 mg/dL (ref 8.4–10.5)
Chloride: 98 mEq/L (ref 96–112)
Creatinine, Ser: 0.63 mg/dL (ref 0.40–1.20)
GFR: 97.7 mL/min (ref >60.00)
Glucose, Bld: 84 mg/dL (ref 70–99)
Potassium: 3.3 mEq/L — ABNORMAL LOW (ref 3.5–5.1)
Sodium: 140 mEq/L (ref 135–145)
Total Bilirubin: 0.4 mg/dL (ref 0.2–1.2)
Total Protein: 6.8 g/dL (ref 6.0–8.3)

## 2019-12-28 LAB — LIPID PANEL
Cholesterol: 160 mg/dL (ref 0–200)
HDL: 51.6 mg/dL (ref >39.00)
LDL Cholesterol: 83 mg/dL (ref 0–99)
NonHDL: 108.46
Total CHOL/HDL Ratio: 3
Triglycerides: 125 mg/dL (ref 0.0–149.0)
VLDL: 25 mg/dL (ref 0.0–40.0)

## 2019-12-28 NOTE — Patient Instructions (Addendum)
Your bp is well controlled. Continue current bp meds.   For high cholesterol, will repeat lipid panel fasting today. Hopefully crestor every other day adequate enough.  For lft elevation will follow cmp. Keep in mind to reduce alcohol use in future. Abstain one week before future cmp draw to get accurate measure of lft.  Continue to cut back on smoking.   Follow up in 3 months or as needed

## 2019-12-28 NOTE — Progress Notes (Signed)
Subjective:    Patient ID: Lauren Case, female    DOB: 12/31/1965, 54 y.o.   MRN: [id number]  HPI  Pt in for follow up.  Pt in today for follow up on htn. She is on amlodipine and losartan. No current cardiac or neurologic signs or symptoms. Pt states does not check her blood pressure at home.   Hx of high cholesterol. Was on crestor. She stopped possibly due to insurance issue and she thinks had mild muscle aches(but not severe). On last visit she started crestor every other day and has done well. Wanted her to get cmp and lipid panel fasting today   LFT elevation in the past. Will repeat today. Pt states she drinks one shot vodka at night.  Pt is smoking but only 3-4 cigarettes a day.   Review of Systems  Constitutional: Negative for chills, fatigue and fever.  HENT: Negative for congestion, ear pain, mouth sores, postnasal drip, rhinorrhea, sinus pressure, sinus pain and sore throat.   Respiratory: Negative for cough, chest tightness, shortness of breath and wheezing.   Cardiovascular: Negative for chest pain and palpitations.  Gastrointestinal: Negative for abdominal pain.  Genitourinary: Negative for dysuria and flank pain.  Musculoskeletal: Negative for back pain, myalgias and neck stiffness.  Skin: Negative for rash.  Neurological: Negative for dizziness, seizures, weakness, numbness and headaches.  Hematological: Negative for adenopathy. Does not bruise/bleed easily.  Psychiatric/Behavioral: Negative for behavioral problems, confusion and suicidal ideas. The patient is not nervous/anxious.    Past Medical History:  Diagnosis Date  . Chicken pox   . Environmental allergies   . GERD (gastroesophageal reflux disease)   . Hypertension   . Measles   . Mumps   . Seasonal allergies   . Vaginal Pap smear, abnormal      Social History   Socioeconomic History  . Marital status: Single    Spouse name: Not on file  . Number of children: Not on file  . Years of  education: Not on file  . Highest education level: Not on file  Occupational History  . Not on file  Tobacco Use  . Smoking status: Current Every Day Smoker    Packs/day: 1.00    Years: 30.00    Pack years: 30.00  . Smokeless tobacco: Never Used  Substance and Sexual Activity  . Alcohol use: Yes    Alcohol/week: 10.0 standard drinks    Types: 10 Shots of liquor per week    Comment: daily  . Drug use: No  . Sexual activity: Never    Birth control/protection: Surgical  Other Topics Concern  . Not on file  Social History Narrative  . Not on file   Social Determinants of Health   Financial Resource Strain:   . Difficulty of Paying Living Expenses: Not on file  Food Insecurity:   . Worried About Charity fundraiser in the Last Year: Not on file  . Ran Out of Food in the Last Year: Not on file  Transportation Needs:   . Lack of Transportation (Medical): Not on file  . Lack of Transportation (Non-Medical): Not on file  Physical Activity:   . Days of Exercise per Week: Not on file  . Minutes of Exercise per Session: Not on file  Stress:   . Feeling of Stress : Not on file  Social Connections:   . Frequency of Communication with Friends and Family: Not on file  . Frequency of Social Gatherings with Friends and Family:  Not on file  . Attends Religious Services: Not on file  . Active Member of Clubs or Organizations: Not on file  . Attends Archivist Meetings: Not on file  . Marital Status: Not on file  Intimate Partner Violence:   . Fear of Current or Ex-Partner: Not on file  . Emotionally Abused: Not on file  . Physically Abused: Not on file  . Sexually Abused: Not on file    Past Surgical History:  Procedure Laterality Date  . ESSURE TUBAL LIGATION    . TONSILLECTOMY  1984  . WISDOM TOOTH EXTRACTION      Family History  Problem Relation Age of Onset  . Healthy Mother        Living  . Healthy Father        Living  . Diabetes Maternal Grandfather         Borderline  . COPD Maternal Aunt   . COPD Maternal Uncle   . Alzheimer's disease Maternal Grandmother   . Alzheimer's disease Paternal Grandfather   . Healthy Brother        x1  . Allergies Son        x1    Allergies  Allergen Reactions  . Other Anaphylaxis    Truffle Oils  . Penicillins Anaphylaxis    Current Outpatient Medications on File Prior to Visit  Medication Sig Dispense Refill  . albuterol (PROVENTIL HFA;VENTOLIN HFA) 108 (90 Base) MCG/ACT inhaler Inhale 2 puffs into the lungs every 6 (six) hours as needed for wheezing or shortness of breath. 1 Inhaler 2  . amLODipine (NORVASC) 10 MG tablet TAKE 1 TABLET BY MOUTH EVERY DAY 90 tablet 1  . azelastine (ASTELIN) 0.1 % nasal spray Place 2 sprays into both nostrils 2 (two) times daily. Use in each nostril as directed 30 mL 3  . buPROPion (WELLBUTRIN SR) 150 MG 12 hr tablet TAKE 1 TABLET BY MOUTH EVERY DAY 30 tablet 0  . cetirizine (ZYRTEC) 10 MG tablet Take 10 mg by mouth daily.    . clobetasol ointment (TEMOVATE) [id number] % Apply 1 application topically 2 (two) times daily. 30 g 0  . clonazePAM (KLONOPIN) 0.5 MG tablet Take 1 tablet (0.5 mg total) by mouth 2 (two) times daily as needed for anxiety. 14 tablet 0  . ezetimibe (ZETIA) 10 MG tablet Take 1 tablet (10 mg total) by mouth daily. 30 tablet 3  . FLUoxetine (PROZAC) 20 MG capsule Take 3 capsules (60 mg total) by mouth daily. Needs ov/follow up 90 capsule 0  . fluticasone (FLONASE) 50 MCG/ACT nasal spray Place 2 sprays into both nostrils daily. 16 g 2  . levocetirizine (XYZAL) 5 MG tablet Take 1 tablet (5 mg total) by mouth every evening. 30 tablet 3  . losartan (COZAAR) 100 MG tablet TAKE 1 TABLET BY MOUTH EVERY DAY 30 tablet 2  . nicotine (NICODERM CQ - DOSED IN MG/24 HR) 7 mg/24hr patch Place 1 patch (7 mg total) onto the skin daily. 28 patch 0  . omeprazole (PRILOSEC) 20 MG capsule TAKE 1 CAPSULE BY MOUTH DAILY FOR ACID REFLUX 90 capsule 0  . potassium chloride (KLOR-CON)  10 MEQ tablet TAKE 1 TABLET BY MOUTH EACH DAY 30 tablet 2  . rosuvastatin (CRESTOR) 10 MG tablet TAKE 1 TABLET BY MOUTH EVERY DAY 30 tablet 3  . tiZANidine (ZANAFLEX) 4 MG tablet Take 1 tablet (4 mg total) by mouth at bedtime. 15 tablet 0  . traZODone (DESYREL) 50 MG tablet Take 0.5  tablets (25 mg total) by mouth at bedtime as needed for sleep. 30 tablet 1   No current facility-administered medications on file prior to visit.    BP 122/80 (BP Location: Left Arm, Patient Position: Sitting, Cuff Size: Normal)   Pulse 73   Temp (!) 96.9 F (36.1 C) (Temporal)   Resp 18   Ht 5\' 4"  (1.626 m)   Wt 188 lb 3.2 oz (85.4 kg)   SpO2 98%   BMI 32.30 kg/m       Objective:   Physical Exam  General Mental Status- Alert. General Appearance- Not in acute distress.   Skin General: Color- Normal Color. Moisture- Normal Moisture.  Neck Carotid Arteries- Normal color. Moisture- Normal Moisture. No carotid bruits. No JVD.  Chest and Lung Exam Auscultation: Breath Sounds:-Normal.  Cardiovascular Auscultation:Rythm- Regular. Murmurs & Other Heart Sounds:Auscultation of the heart reveals- No Murmurs.  Abdomen Inspection:-Inspeection Normal. Palpation/Percussion:Note:No mass. Palpation and Percussion of the abdomen reveal- Non Tender, Non Distended + BS, no rebound or guarding.  Neurologic Cranial Nerve exam:- CN III-XII intact(No nystagmus), symmetric smile. Strength:- 5/5 equal and symmetric strength both upper and lower extremities.      Assessment & Plan:  Your bp is well controlled. Continue current bp meds.   For high cholesterol, will repeat lipid panel fasting today. Hopefully crestor every other day adequate enough.  For lft elevation will follow cmp. Keep in mind to reduce alcohol use in future. Abstain one week before future cmp draw to get accurate measure of lft.  Continue to cut back on smoking.   Follow up in 3 months or as needed  25 minutes spent with  pt.   Mackie Pai, PA-C

## 2019-12-30 ENCOUNTER — Telehealth: Payer: Self-pay | Admitting: Medical

## 2019-12-30 MED ORDER — POTASSIUM CHLORIDE CRYS ER 20 MEQ PO TBCR: 20.0000 meq | EXTENDED_RELEASE_TABLET | Freq: Every day | ORAL | 3 refills | Status: DC

## 2019-12-30 NOTE — Telephone Encounter (Signed)
Higher dose kdur 20 meq sent to pt pharmacy.

## 2020-01-09 ENCOUNTER — Other Ambulatory Visit: Payer: Self-pay | Admitting: Medical

## 2020-01-19 ENCOUNTER — Other Ambulatory Visit: Payer: Self-pay | Admitting: Medical

## 2020-02-07 ENCOUNTER — Other Ambulatory Visit: Payer: Self-pay | Admitting: Medical

## 2020-02-16 ENCOUNTER — Other Ambulatory Visit: Payer: Self-pay | Admitting: Medical

## 2020-03-10 ENCOUNTER — Other Ambulatory Visit: Payer: Self-pay | Admitting: Medical

## 2020-03-10 DIAGNOSIS — K219 Gastro-esophageal reflux disease without esophagitis: Secondary | ICD-10-CM

## 2020-03-19 ENCOUNTER — Other Ambulatory Visit: Payer: Self-pay | Admitting: Medical

## 2020-04-10 ENCOUNTER — Other Ambulatory Visit: Payer: Self-pay | Admitting: Medical

## 2020-04-16 ENCOUNTER — Other Ambulatory Visit: Payer: Self-pay | Admitting: Medical

## 2020-05-01 ENCOUNTER — Other Ambulatory Visit: Payer: Self-pay | Admitting: Medical

## 2020-05-01 ENCOUNTER — Other Ambulatory Visit: Payer: Self-pay | Admitting: Family

## 2020-05-16 ENCOUNTER — Other Ambulatory Visit: Payer: Self-pay | Admitting: Medical

## 2020-06-07 ENCOUNTER — Other Ambulatory Visit: Payer: Self-pay | Admitting: Family

## 2020-06-07 ENCOUNTER — Other Ambulatory Visit: Payer: Self-pay | Admitting: Medical

## 2020-06-07 ENCOUNTER — Other Ambulatory Visit: Payer: Self-pay

## 2020-06-07 DIAGNOSIS — K219 Gastro-esophageal reflux disease without esophagitis: Secondary | ICD-10-CM

## 2020-06-07 MED ORDER — OMEPRAZOLE 20 MG PO CPDR: DELAYED_RELEASE_CAPSULE | ORAL | 0 refills | Status: DC

## 2020-06-14 ENCOUNTER — Other Ambulatory Visit: Payer: Self-pay | Admitting: Medical

## 2020-07-03 ENCOUNTER — Other Ambulatory Visit: Payer: Self-pay | Admitting: Family

## 2020-07-03 ENCOUNTER — Other Ambulatory Visit: Payer: Self-pay | Admitting: Medical

## 2020-07-03 NOTE — Telephone Encounter (Signed)
Rx amlodipine sent to pt pharmacy. Ask pt to scheduled follow up for in office bp check.

## 2020-07-04 NOTE — Telephone Encounter (Signed)
Called pt and lvm to return call and schedule an appt. 

## 2020-07-11 ENCOUNTER — Ambulatory Visit: Payer: PRIVATE HEALTH INSURANCE | Admitting: Medical

## 2020-07-13 ENCOUNTER — Other Ambulatory Visit: Payer: Self-pay

## 2020-07-13 ENCOUNTER — Ambulatory Visit: Payer: PRIVATE HEALTH INSURANCE | Admitting: Medical

## 2020-07-13 VITALS — BP 133/74 | HR 85 | Resp 18 | Ht 64.0 in | Wt 187.0 lb

## 2020-07-13 DIAGNOSIS — R748 Abnormal levels of other serum enzymes: Secondary | ICD-10-CM | POA: Diagnosis not present

## 2020-07-13 DIAGNOSIS — F17200 Nicotine dependence, unspecified, uncomplicated: Secondary | ICD-10-CM | POA: Diagnosis not present

## 2020-07-13 DIAGNOSIS — I10 Essential (primary) hypertension: Secondary | ICD-10-CM | POA: Diagnosis not present

## 2020-07-13 DIAGNOSIS — E785 Hyperlipidemia, unspecified: Secondary | ICD-10-CM | POA: Diagnosis not present

## 2020-07-13 NOTE — Progress Notes (Signed)
Subjective:    Patient ID: Lauren Case, female    DOB: 04/06/1966, 55 y.o.   MRN: [id number]  HPI  Pt bp is well controlled. Pt has bp cuff and has been checking. Has had good readings similar to today reading. She is on amlodipine and losartan. No current cardiac or neurologic signs or symptoms.  Hx of high cholesterol. Pt still is on zetia. She can't tolerate statin.   LFT elevation in the past. Will repeat on scheduled fasting labs.. Pt states she drinks one shot vodka every other night. Less than before.  Pt is smoking but only 3-4 cigarettes a day.   Review of Systems  Constitutional: Negative for chills, fatigue and fever.  Respiratory: Negative for cough, choking and wheezing.   Cardiovascular: Negative for chest pain and palpitations.  Gastrointestinal: Negative for abdominal pain, constipation, nausea and vomiting.  Musculoskeletal: Negative for back pain.  Skin: Negative for rash.  Neurological: Negative for dizziness, syncope, weakness, numbness and headaches.  Hematological: Negative for adenopathy. Does not bruise/bleed easily.  Psychiatric/Behavioral: Negative for behavioral problems and confusion.   Past Medical History:  Diagnosis Date  . Chicken pox   . Environmental allergies   . GERD (gastroesophageal reflux disease)   . Hypertension   . Measles   . Mumps   . Seasonal allergies   . Vaginal Pap smear, abnormal      Social History   Socioeconomic History  . Marital status: Single    Spouse name: Not on file  . Number of children: Not on file  . Years of education: Not on file  . Highest education level: Not on file  Occupational History  . Not on file  Tobacco Use  . Smoking status: Current Every Day Smoker    Packs/day: 1.00    Years: 30.00    Pack years: 30.00  . Smokeless tobacco: Never Used  Vaping Use  . Vaping Use: Never used  Substance and Sexual Activity  . Alcohol use: Yes    Alcohol/week: 10.0 standard drinks    Types:  10 Shots of liquor per week    Comment: daily  . Drug use: No  . Sexual activity: Never    Birth control/protection: Surgical  Other Topics Concern  . Not on file  Social History Narrative  . Not on file   Social Determinants of Health   Financial Resource Strain:   . Difficulty of Paying Living Expenses: Not on file  Food Insecurity:   . Worried About Charity fundraiser in the Last Year: Not on file  . Ran Out of Food in the Last Year: Not on file  Transportation Needs:   . Lack of Transportation (Medical): Not on file  . Lack of Transportation (Non-Medical): Not on file  Physical Activity:   . Days of Exercise per Week: Not on file  . Minutes of Exercise per Session: Not on file  Stress:   . Feeling of Stress : Not on file  Social Connections:   . Frequency of Communication with Friends and Family: Not on file  . Frequency of Social Gatherings with Friends and Family: Not on file  . Attends Religious Services: Not on file  . Active Member of Clubs or Organizations: Not on file  . Attends Archivist Meetings: Not on file  . Marital Status: Not on file  Intimate Partner Violence:   . Fear of Current or Ex-Partner: Not on file  . Emotionally Abused: Not on file  .  Physically Abused: Not on file  . Sexually Abused: Not on file    Past Surgical History:  Procedure Laterality Date  . ESSURE TUBAL LIGATION    . TONSILLECTOMY  1984  . WISDOM TOOTH EXTRACTION      Family History  Problem Relation Age of Onset  . Healthy Mother        Living  . Healthy Father        Living  . Diabetes Maternal Grandfather        Borderline  . COPD Maternal Aunt   . COPD Maternal Uncle   . Alzheimer's disease Maternal Grandmother   . Alzheimer's disease Paternal Grandfather   . Healthy Brother        x1  . Allergies Son        x1    Allergies  Allergen Reactions  . Other Anaphylaxis    Truffle Oils  . Penicillins Anaphylaxis    Current Outpatient Medications on  File Prior to Visit  Medication Sig Dispense Refill  . albuterol (PROVENTIL HFA;VENTOLIN HFA) 108 (90 Base) MCG/ACT inhaler Inhale 2 puffs into the lungs every 6 (six) hours as needed for wheezing or shortness of breath. 1 Inhaler 2  . amLODipine (NORVASC) 10 MG tablet TAKE 1 TABLET BY MOUTH EVERY DAY 90 tablet 0  . azelastine (ASTELIN) 0.1 % nasal spray Place 2 sprays into both nostrils 2 (two) times daily. Use in each nostril as directed 30 mL 3  . buPROPion (WELLBUTRIN SR) 150 MG 12 hr tablet TAKE 1 TABLET BY MOUTH EVERY DAY 30 tablet 0  . cetirizine (ZYRTEC) 10 MG tablet Take 10 mg by mouth daily.    . clobetasol ointment (TEMOVATE) 4.43 % Apply 1 application topically 2 (two) times daily. 30 g 0  . clonazePAM (KLONOPIN) 0.5 MG tablet Take 1 tablet (0.5 mg total) by mouth 2 (two) times daily as needed for anxiety. 14 tablet 0  . ezetimibe (ZETIA) 10 MG tablet Take 1 tablet (10 mg total) by mouth daily. 30 tablet 3  . FLUoxetine (PROZAC) 20 MG capsule TAKE 3 CAPSULES BY MOUTH DAILY 90 capsule 0  . fluticasone (FLONASE) 50 MCG/ACT nasal spray Place 2 sprays into both nostrils daily. 16 g 2  . levocetirizine (XYZAL) 5 MG tablet Take 1 tablet (5 mg total) by mouth every evening. 30 tablet 3  . losartan (COZAAR) 100 MG tablet TAKE 1 TABLET BY MOUTH EVERY DAY 30 tablet 2  . nicotine (NICODERM CQ - DOSED IN MG/24 HR) 7 mg/24hr patch Place 1 patch (7 mg total) onto the skin daily. 28 patch 0  . omeprazole (PRILOSEC) 20 MG capsule TAKE 1 CAPSULE BY MOUTH DAILY FOR ACID REFLUX 90 capsule 0  . potassium chloride SA (KLOR-CON) 20 MEQ tablet Take 1 tablet (20 mEq total) by mouth daily. 30 tablet 3  . rosuvastatin (CRESTOR) 10 MG tablet TAKE 1 TABLET BY MOUTH EVERY DAY 30 tablet 3  . tiZANidine (ZANAFLEX) 4 MG tablet Take 1 tablet (4 mg total) by mouth at bedtime. 15 tablet 0  . traZODone (DESYREL) 50 MG tablet Take 0.5 tablets (25 mg total) by mouth at bedtime as needed for sleep. 30 tablet 1   No  current facility-administered medications on file prior to visit.    BP 133/74   Pulse 85   Resp 18   Ht 5\' 4"  (1.626 m)   Wt 187 lb (84.8 kg)   SpO2 97%   BMI 32.10 kg/m  Objective:   Physical Exam  General Mental Status- Alert. General Appearance- Not in acute distress.   Skin General: Color- Normal Color. Moisture- Normal Moisture.  Neck Carotid Arteries- Normal color. Moisture- Normal Moisture. No carotid bruits. No JVD.  Chest and Lung Exam Auscultation: Breath Sounds:-Normal.  Cardiovascular Auscultation:Rythm- Regular. Murmurs & Other Heart Sounds:Auscultation of the heart reveals- No Murmurs.  Abdomen Inspection:-Inspeection Normal. Palpation/Percussion:Note:No mass. Palpation and Percussion of the abdomen reveal- Non Tender, Non Distended + BS, no rebound or guarding.    Neurologic Cranial Nerve exam:- CN III-XII intact(No nystagmus), symmetric smile. Strength:- 5/5 equal and symmetric strength both upper and lower extremities.      Assessment & Plan:  Your blood pressure is well controlled today.  Continue amlodipine and losartan.  I am glad to hear that you did get blood pressure cuff.  Keep checking approximately 3-4 times a week to make sure BP staying in line.  History of hyperlipidemia please get scheduled for future fasting lipid panel and metabolic panel.  Continue Zetia.  You do have history of elevated liver enzymes.  Do still recommend that you try to stop drinking alcohol completely.  After metabolic panel review might get repeat ultrasound.  Glad to hear that you had decreased the amount of to smoke.  Continue to try to quit completely.  Will follow up on labs and likely recommend 34-month follow-up sooner if needed.    Mackie Pai, PA-C

## 2020-07-13 NOTE — Patient Instructions (Addendum)
Your blood pressure is well controlled today.  Continue amlodipine and losartan.  I am glad to hear that you did get blood pressure cuff.  Keep checking approximately 3-4 times a week to make sure BP staying in line.  History of hyperlipidemia please get scheduled for future fasting lipid panel and metabolic panel.  Continue Zetia.  You do have history of elevated liver enzymes.  Do still recommend that you try to stop drinking alcohol completely.  After metabolic panel review might get repeat ultrasound.  Glad to hear that you had decreased the amount of to smoke.  Continue to try to quit completely.  Will follow up on labs and likely recommend 64-month follow-up sooner if needed.

## 2020-07-17 ENCOUNTER — Other Ambulatory Visit: Payer: Self-pay | Admitting: Medical

## 2020-07-18 ENCOUNTER — Other Ambulatory Visit: Payer: PRIVATE HEALTH INSURANCE

## 2020-07-18 ENCOUNTER — Other Ambulatory Visit: Payer: Self-pay

## 2020-07-18 DIAGNOSIS — E876 Hypokalemia: Secondary | ICD-10-CM

## 2020-07-18 NOTE — Addendum Note (Signed)
Addended by: Angelina Pih on: 07/18/2020 08:49 AM   Modules accepted: Orders

## 2020-07-19 ENCOUNTER — Telehealth: Payer: Self-pay | Admitting: Medical

## 2020-07-19 DIAGNOSIS — R748 Abnormal levels of other serum enzymes: Secondary | ICD-10-CM

## 2020-07-19 DIAGNOSIS — K760 Fatty (change of) liver, not elsewhere classified: Secondary | ICD-10-CM

## 2020-07-19 LAB — COMPREHENSIVE METABOLIC PANEL
AG Ratio: 1.7 (calc) (ref 1.0–2.5)
ALT: 61 U/L — ABNORMAL HIGH (ref 6–29)
AST: 102 U/L — ABNORMAL HIGH (ref 10–35)
Albumin: 4.4 g/dL (ref 3.6–5.1)
Alkaline phosphatase (APISO): 113 U/L (ref 37–153)
BUN: 7 mg/dL (ref 7–25)
CO2: 29 mmol/L (ref 20–32)
Calcium: 9.1 mg/dL (ref 8.6–10.4)
Chloride: 100 mmol/L (ref 98–110)
Creat: 0.68 mg/dL (ref 0.50–1.05)
Globulin: 2.6 g/dL (calc) (ref 1.9–3.7)
Glucose, Bld: 86 mg/dL (ref 65–99)
Potassium: 3.5 mmol/L (ref 3.5–5.3)
Sodium: 141 mmol/L (ref 135–146)
Total Bilirubin: 0.2 mg/dL (ref 0.2–1.2)
Total Protein: 7 g/dL (ref 6.1–8.1)

## 2020-07-19 NOTE — Telephone Encounter (Signed)
Future right upper quadrant/liver ultrasound placed.

## 2020-08-07 ENCOUNTER — Other Ambulatory Visit: Payer: Self-pay | Admitting: Medical

## 2020-08-09 ENCOUNTER — Other Ambulatory Visit: Payer: Self-pay | Admitting: Medical

## 2020-08-15 ENCOUNTER — Other Ambulatory Visit: Payer: Self-pay | Admitting: Medical

## 2020-09-04 ENCOUNTER — Other Ambulatory Visit: Payer: Self-pay | Admitting: Medical

## 2020-09-15 ENCOUNTER — Other Ambulatory Visit: Payer: Self-pay | Admitting: Medical

## 2020-09-17 ENCOUNTER — Other Ambulatory Visit: Payer: Self-pay | Admitting: Medical

## 2020-09-19 ENCOUNTER — Telehealth: Payer: Self-pay

## 2020-09-19 NOTE — Telephone Encounter (Signed)
Pt called about prescription refill. Scheduled appt for pt to see Evern Core on Monday 11/22. Pt asked if refill could be sent in through Monday due to running out before appt.   MEDICATION: Wellbutrin 150 MG  PHARMACY: [facility]

## 2020-09-20 ENCOUNTER — Telehealth: Payer: Self-pay | Admitting: Medical

## 2020-09-20 MED ORDER — BUPROPION HCL ER (SR) 150 MG PO TB12: 150.0000 mg | ORAL_TABLET | Freq: Every day | ORAL | 0 refills | Status: DC

## 2020-09-20 NOTE — Telephone Encounter (Signed)
Rx wellbutrin sent to pt pharmacy.

## 2020-09-20 NOTE — Telephone Encounter (Signed)
Rx wellbutrin sent to pt pharmacy. °

## 2020-09-21 NOTE — Telephone Encounter (Signed)
Limited number refill sent to pharmacy. Pending appointment with me.

## 2020-09-24 ENCOUNTER — Ambulatory Visit: Payer: PRIVATE HEALTH INSURANCE | Admitting: Medical

## 2020-10-02 ENCOUNTER — Ambulatory Visit (INDEPENDENT_AMBULATORY_CARE_PROVIDER_SITE_OTHER): Payer: PRIVATE HEALTH INSURANCE | Admitting: Medical

## 2020-10-02 ENCOUNTER — Other Ambulatory Visit: Payer: Self-pay

## 2020-10-02 VITALS — BP 128/68 | HR 82 | Resp 18 | Ht 64.0 in | Wt 196.0 lb

## 2020-10-02 DIAGNOSIS — R748 Abnormal levels of other serum enzymes: Secondary | ICD-10-CM

## 2020-10-02 DIAGNOSIS — J309 Allergic rhinitis, unspecified: Secondary | ICD-10-CM | POA: Diagnosis not present

## 2020-10-02 DIAGNOSIS — K760 Fatty (change of) liver, not elsewhere classified: Secondary | ICD-10-CM | POA: Diagnosis not present

## 2020-10-02 DIAGNOSIS — F17200 Nicotine dependence, unspecified, uncomplicated: Secondary | ICD-10-CM

## 2020-10-02 DIAGNOSIS — I10 Essential (primary) hypertension: Secondary | ICD-10-CM | POA: Diagnosis not present

## 2020-10-02 MED ORDER — BUPROPION HCL ER (XL) 150 MG PO TB24: 150.0000 mg | ORAL_TABLET | Freq: Every day | ORAL | 5 refills | Status: DC

## 2020-10-02 NOTE — Progress Notes (Signed)
Subjective:    Patient ID: Lauren Case, female    DOB: 1966/09/07, 54 y.o.   MRN: [id number]  HPI Pt in with mild itching to throat. Mild pnd. No sneezing. She states no nasal congestion. Pt does get allergies this time of year. In fact she states sometimes year round. Pt has xyzal, astelin and flonase.  Mild throat pain swallowing. Pt states no close contacts sick.  Pt has had [employer] in the pat.  Pt is trying to quit smoking. She states barely smoking at all since she started wellbutrin. Now only smoking about 2 cigarettes. Down from pack.     Review of Systems  Constitutional: Negative for chills, fatigue and fever.  Respiratory: Negative for cough, chest tightness, shortness of breath and wheezing.   Cardiovascular: Negative for chest pain and palpitations.  Gastrointestinal: Negative for abdominal pain, blood in stool, diarrhea and nausea.  Musculoskeletal: Negative for back pain, gait problem and myalgias.  Neurological: Negative for dizziness, syncope, speech difficulty, weakness, numbness and headaches.  Hematological: Negative for adenopathy. Does not bruise/bleed easily.  Psychiatric/Behavioral: Negative for behavioral problems and confusion. The patient is nervous/anxious.    Past Medical History:  Diagnosis Date  . Chicken pox   . Environmental allergies   . GERD (gastroesophageal reflux disease)   . Hypertension   . Measles   . Mumps   . Seasonal allergies   . Vaginal Pap smear, abnormal      Social History   Socioeconomic History  . Marital status: Single    Spouse name: Not on file  . Number of children: Not on file  . Years of education: Not on file  . Highest education level: Not on file  Occupational History  . Not on file  Tobacco Use  . Smoking status: Current Every Day Smoker    Packs/day: 1.00    Years: 30.00    Pack years: 30.00  . Smokeless tobacco: Never Used  Vaping Use  . Vaping Use: Never used  Substance and  Sexual Activity  . Alcohol use: Yes    Alcohol/week: 10.0 standard drinks    Types: 10 Shots of liquor per week    Comment: daily  . Drug use: No  . Sexual activity: Never    Birth control/protection: Surgical  Other Topics Concern  . Not on file  Social History Narrative  . Not on file   Social Determinants of Health   Financial Resource Strain:   . Difficulty of Paying Living Expenses: Not on file  Food Insecurity:   . Worried About Charity fundraiser in the Last Year: Not on file  . Ran Out of Food in the Last Year: Not on file  Transportation Needs:   . Lack of Transportation (Medical): Not on file  . Lack of Transportation (Non-Medical): Not on file  Physical Activity:   . Days of Exercise per Week: Not on file  . Minutes of Exercise per Session: Not on file  Stress:   . Feeling of Stress : Not on file  Social Connections:   . Frequency of Communication with Friends and Family: Not on file  . Frequency of Social Gatherings with Friends and Family: Not on file  . Attends Religious Services: Not on file  . Active Member of Clubs or Organizations: Not on file  . Attends Archivist Meetings: Not on file  . Marital Status: Not on file  Intimate Partner Violence:   . Fear of Current  or Ex-Partner: Not on file  . Emotionally Abused: Not on file  . Physically Abused: Not on file  . Sexually Abused: Not on file    Past Surgical History:  Procedure Laterality Date  . ESSURE TUBAL LIGATION    . TONSILLECTOMY  1984  . WISDOM TOOTH EXTRACTION      Family History  Problem Relation Age of Onset  . Healthy Mother        Living  . Healthy Father        Living  . Diabetes Maternal Grandfather        Borderline  . COPD Maternal Aunt   . COPD Maternal Uncle   . Alzheimer's disease Maternal Grandmother   . Alzheimer's disease Paternal Grandfather   . Healthy Brother        x1  . Allergies Son        x1    Allergies  Allergen Reactions  . Other  Anaphylaxis    Truffle Oils  . Penicillins Anaphylaxis    Current Outpatient Medications on File Prior to Visit  Medication Sig Dispense Refill  . albuterol (PROVENTIL HFA;VENTOLIN HFA) 108 (90 Base) MCG/ACT inhaler Inhale 2 puffs into the lungs every 6 (six) hours as needed for wheezing or shortness of breath. 1 Inhaler 2  . amLODipine (NORVASC) 10 MG tablet TAKE 1 TABLET BY MOUTH EVERY DAY 90 tablet 0  . azelastine (ASTELIN) 0.1 % nasal spray Place 2 sprays into both nostrils 2 (two) times daily. Use in each nostril as directed 30 mL 3  . buPROPion (WELLBUTRIN SR) 150 MG 12 hr tablet Take 1 tablet (150 mg total) by mouth daily. 15 tablet 0  . cetirizine (ZYRTEC) 10 MG tablet Take 10 mg by mouth daily.    . clobetasol ointment (TEMOVATE) 0.22 % Apply 1 application topically 2 (two) times daily. 30 g 0  . clonazePAM (KLONOPIN) 0.5 MG tablet Take 1 tablet (0.5 mg total) by mouth 2 (two) times daily as needed for anxiety. 14 tablet 0  . ezetimibe (ZETIA) 10 MG tablet Take 1 tablet (10 mg total) by mouth daily. 30 tablet 3  . FLUoxetine (PROZAC) 20 MG capsule TAKE 3 CAPSULES BY MOUTH DAILY 90 capsule 0  . fluticasone (FLONASE) 50 MCG/ACT nasal spray Place 2 sprays into both nostrils daily. 16 g 2  . levocetirizine (XYZAL) 5 MG tablet Take 1 tablet (5 mg total) by mouth every evening. 30 tablet 3  . losartan (COZAAR) 100 MG tablet Take 1 tablet (100 mg total) by mouth daily. 30 tablet 3  . nicotine (NICODERM CQ - DOSED IN MG/24 HR) 7 mg/24hr patch Place 1 patch (7 mg total) onto the skin daily. 28 patch 0  . omeprazole (PRILOSEC) 20 MG capsule TAKE 1 CAPSULE BY MOUTH DAILY FOR ACID REFLUX 90 capsule 0  . potassium chloride SA (KLOR-CON) 20 MEQ tablet Take 1 tablet (20 mEq total) by mouth daily. 30 tablet 3  . rosuvastatin (CRESTOR) 10 MG tablet TAKE 1 TABLET BY MOUTH EVERY DAY FOR CHOLESTEROL 30 tablet 3  . tiZANidine (ZANAFLEX) 4 MG tablet Take 1 tablet (4 mg total) by mouth at bedtime. 15 tablet  0  . traZODone (DESYREL) 50 MG tablet Take 0.5 tablets (25 mg total) by mouth at bedtime as needed for sleep. 30 tablet 1   No current facility-administered medications on file prior to visit.    BP 128/68   Pulse 82   Resp 18   Ht 5\' 4"  (1.626 m)  Wt 196 lb (88.9 kg)   SpO2 95%   BMI 33.64 kg/m       Objective:   Physical Exam  General Mental Status- Alert. General Appearance- Not in acute distress.   Skin General: Color- Normal Color. Moisture- Normal Moisture.  Neck Carotid Arteries- Normal color. Moisture- Normal Moisture. No carotid bruits. No JVD.  Chest and Lung Exam Auscultation: Breath Sounds:-Normal.  Cardiovascular Auscultation:Rythm- Regular. Murmurs & Other Heart Sounds:Auscultation of the heart reveals- No Murmurs.  Abdomen Inspection:-Inspeection Normal. Palpation/Percussion:Note:No mass. Palpation and Percussion of the abdomen reveal- Non Tender, Non Distended + BS, no rebound or guarding.   Neurologic Cranial Nerve exam:- CN III-XII intact(No nystagmus), symmetric smile. Strength:- 5/5 equal and symmetric strength both upper and lower extremities.  HEENT-mild boggy turbinates and positive postnasal drainage.  No tonsillar hypertrophy seen.      Assessment & Plan:  You do have recent postnasal drainage and mild sore throat.  I do think most likely represents early allergies.  Recommend starting Xyzal.  If your throat pain worsens or changes indicating pharyngitis then let me know and would send in prescription of azithromycin.  History of smoking and glad to hear that you have decreased down to 2 cigarettes a day.  I will refill your Wellbutrin and to try to taper down further/dropping last 2 cigarettes if possible.  Blood pressure is well controlled today continue amlodipine.  For history of fatty liver and elevated liver enzymes did place ultrasound liver and gallbladder to get repeated.  Please try to get the schedule within the next week  or 2.  Follow-up 6 months or as needed.

## 2020-10-02 NOTE — Patient Instructions (Addendum)
You do have recent postnasal drainage and mild sore throat.  I do think most likely represents early allergies.  Recommend starting Xyzal.  If your throat pain worsens or changes indicating pharyngitis then let me know and would send in prescription of azithromycin.  History of smoking and glad to hear that you have decreased down to 2 cigarettes a day.  I will refill your Wellbutrin and to try to taper down further/dropping last 2 cigarettes if possible.  Blood pressure is well controlled today continue amlodipine.  For history of fatty liver and elevated liver enzymes did place ultrasound liver and gallbladder to get repeated.  Please try to get the schedule within the next week or 2.  Follow-up 6 months or as needed.

## 2020-10-08 ENCOUNTER — Telehealth: Payer: Self-pay | Admitting: Medical

## 2020-10-08 MED ORDER — AZITHROMYCIN 250 MG PO TABS: ORAL_TABLET | ORAL | 0 refills | Status: DC

## 2020-10-08 NOTE — Telephone Encounter (Signed)
Rx azithromycin sent to pt pharmacy. 

## 2020-10-08 NOTE — Telephone Encounter (Signed)
Patient states saw Saguier last week for sore throat. saguier said he would send something in if not feeling better  Please advise      Pharmacy: [address], [place], [place] [address]

## 2020-10-11 ENCOUNTER — Other Ambulatory Visit: Payer: Self-pay

## 2020-10-11 ENCOUNTER — Ambulatory Visit (HOSPITAL_BASED_OUTPATIENT_CLINIC_OR_DEPARTMENT_OTHER)
Admission: RE | Admit: 2020-10-11 | Discharge: 2020-10-11 | Disposition: A | Discharge status: Home / Self Care | Payer: PRIVATE HEALTH INSURANCE | Source: Ambulatory Visit | Attending: Medical | Admitting: Medical

## 2020-10-11 DIAGNOSIS — R748 Abnormal levels of other serum enzymes: Secondary | ICD-10-CM | POA: Diagnosis present

## 2020-10-11 DIAGNOSIS — K760 Fatty (change of) liver, not elsewhere classified: Secondary | ICD-10-CM | POA: Insufficient documentation

## 2020-10-16 ENCOUNTER — Other Ambulatory Visit: Payer: Self-pay | Admitting: Medical

## 2020-11-05 ENCOUNTER — Other Ambulatory Visit: Payer: Self-pay | Admitting: Medical

## 2020-11-15 ENCOUNTER — Other Ambulatory Visit: Payer: Self-pay | Admitting: Medical

## 2020-12-03 ENCOUNTER — Other Ambulatory Visit: Payer: Self-pay | Admitting: Medical

## 2020-12-03 DIAGNOSIS — K219 Gastro-esophageal reflux disease without esophagitis: Secondary | ICD-10-CM

## 2020-12-04 ENCOUNTER — Other Ambulatory Visit: Payer: Self-pay | Admitting: Medical

## 2021-01-11 ENCOUNTER — Ambulatory Visit: Payer: PRIVATE HEALTH INSURANCE | Admitting: Medical

## 2021-01-11 ENCOUNTER — Other Ambulatory Visit: Payer: Self-pay

## 2021-01-11 ENCOUNTER — Ambulatory Visit (HOSPITAL_BASED_OUTPATIENT_CLINIC_OR_DEPARTMENT_OTHER)
Admission: RE | Admit: 2021-01-11 | Discharge: 2021-01-11 | Disposition: A | Discharge status: Home / Self Care | Payer: 59 | Source: Ambulatory Visit | Attending: Medical | Admitting: Medical

## 2021-01-11 VITALS — BP 128/63 | HR 77 | Temp 98.4°F | Resp 18 | Ht 64.0 in | Wt 204.6 lb

## 2021-01-11 DIAGNOSIS — M7989 Other specified soft tissue disorders: Secondary | ICD-10-CM

## 2021-01-11 DIAGNOSIS — M79662 Pain in left lower leg: Secondary | ICD-10-CM | POA: Diagnosis present

## 2021-01-11 MED ORDER — DOXYCYCLINE HYCLATE 100 MG PO TABS: 100.0000 mg | ORAL_TABLET | Freq: Two times a day (BID) | ORAL | 0 refills | Status: DC

## 2021-01-11 NOTE — Patient Instructions (Addendum)
You have left calf area pain and swelling with history of remote trauma months ago. Will get stat lower ext [place] today. Need to make sure no DVT.  Also some concern for skin infection as you had abrasion at time of remote injury. Tenderness and warmth over former abrasion area. Rx doxycycline antibiotic.  For psoriasis can try aveeno. Continue to follow up with dermatologist. Recommend taking pictures to show your dermatologist.  Follow up 7-10 days or as needed.

## 2021-01-11 NOTE — Progress Notes (Signed)
   Subjective:    Patient ID: Lauren Case, female    DOB: 1966/03/02, 55 y.o.   MRN: [id number]  HPI  Pt in with some swelling in her left leg. Pt tripped back in December. She scraped her leg and states since then had leg swelling. No sob or wheezing. Pt abrasion did heal. She states lateral calf area pain at times when leg is swollen.  At time of fall pt was not evaluated.     Pt has also skin rash. Hx of psoriasis. Worse in winter.     Review of Systems  Constitutional: Negative for chills, fatigue and fever.  Respiratory: Negative for cough, chest tightness, shortness of breath and wheezing.   Cardiovascular: Negative for chest pain and palpitations.  Gastrointestinal: Negative for abdominal pain.  Musculoskeletal: Negative for back pain.       See  Hpi.  Skin:       See hpi.  Neurological: Negative for dizziness and light-headedness.  Hematological: Negative for adenopathy. Does not bruise/bleed easily.  Psychiatric/Behavioral: Negative for behavioral problems and confusion.       Objective:   Physical Exam  General- No acute distress. Pleasant patient. Neck- Full range of motion, no jvd Lungs- Clear, even and unlabored. Heart- regular rate and rhythm. Neurologic- CNII- XII grossly intact.  Left lower- calf moderate swollen lateral aspect. Warm and tender to touch on lateral aspect. Negative homan sign. Rt calf- no swollen.   Skin- scattered dry skin with psoriasis plaque distal pretibial area and on feet both lower ext.      Assessment & Plan:  You have left calf area pain and swelling with history of remote trauma months ago. Will get stat lower ext [place] today.  Need to make sure no dvt.   Also some concern for skin infection as you had abrasion at time of remote injury. Tenderness and warmth over former abrasion area. Rx doxycycline antibiotic.  For psoriasis can try aveeno. Continue to follow up with dermatologist. Recommend taking pictures to show  your dermatologist.  Follow up 7-10 days or as needed.  Mackie Pai, PA-C

## 2021-01-15 ENCOUNTER — Other Ambulatory Visit: Payer: Self-pay | Admitting: Medical

## 2021-02-13 ENCOUNTER — Other Ambulatory Visit: Payer: Self-pay | Admitting: Medical

## 2021-03-01 ENCOUNTER — Other Ambulatory Visit: Payer: Self-pay | Admitting: Medical

## 2021-03-01 DIAGNOSIS — K219 Gastro-esophageal reflux disease without esophagitis: Secondary | ICD-10-CM

## 2021-03-30 ENCOUNTER — Other Ambulatory Visit: Payer: Self-pay | Admitting: Medical

## 2021-05-16 ENCOUNTER — Other Ambulatory Visit: Payer: Self-pay | Admitting: Medical

## 2021-05-28 ENCOUNTER — Other Ambulatory Visit: Payer: Self-pay | Admitting: Medical

## 2021-05-28 DIAGNOSIS — K219 Gastro-esophageal reflux disease without esophagitis: Secondary | ICD-10-CM

## 2021-06-27 ENCOUNTER — Other Ambulatory Visit: Payer: Self-pay

## 2021-06-27 ENCOUNTER — Ambulatory Visit (INDEPENDENT_AMBULATORY_CARE_PROVIDER_SITE_OTHER): Payer: 59 | Admitting: Medical

## 2021-06-27 VITALS — BP 126/60 | HR 72 | Temp 98.2°F | Resp 18 | Ht 64.0 in | Wt 196.0 lb

## 2021-06-27 DIAGNOSIS — J029 Acute pharyngitis, unspecified: Secondary | ICD-10-CM | POA: Diagnosis not present

## 2021-06-27 DIAGNOSIS — R21 Rash and other nonspecific skin eruption: Secondary | ICD-10-CM

## 2021-06-27 DIAGNOSIS — K140 Glossitis: Secondary | ICD-10-CM | POA: Diagnosis not present

## 2021-06-27 DIAGNOSIS — F439 Reaction to severe stress, unspecified: Secondary | ICD-10-CM | POA: Diagnosis not present

## 2021-06-27 DIAGNOSIS — J3489 Other specified disorders of nose and nasal sinuses: Secondary | ICD-10-CM

## 2021-06-27 LAB — VITAMIN B12: Vitamin B-12: 333 pg/mL (ref 211–911)

## 2021-06-27 LAB — FOLATE: Folate: 3.5 ng/mL — ABNORMAL LOW (ref >5.9)

## 2021-06-27 MED ORDER — PREDNISONE 10 MG (21) PO TBPK: ORAL_TABLET | ORAL | 0 refills | Status: DC

## 2021-06-27 MED ORDER — HYDROXYZINE HCL 10 MG PO TABS: 10.0000 mg | ORAL_TABLET | Freq: Three times a day (TID) | ORAL | 0 refills | Status: DC | PRN

## 2021-06-27 MED ORDER — AZITHROMYCIN 250 MG PO TABS: ORAL_TABLET | ORAL | 0 refills | Status: DC

## 2021-06-27 MED ORDER — AZITHROMYCIN 250 MG PO TABS: ORAL_TABLET | ORAL | 0 refills | Status: AC

## 2021-06-27 NOTE — Progress Notes (Signed)
Subjective:    Patient ID: Lauren Case, female    DOB: 1966/05/08, 55 y.o.   MRN: [id number]  HPI Pt has recent bilateral upper ext rash since June. Pt the rash has itched intermittently. Rash will come and go.   Pt has tried some zyrtec, camphor menthol cream and aveeno.  Pt has no change in detergents or soaps. No known poison ivy exposure.   Pt also mentions recently mild st(Sunday symptoms started). Tuesday tested negative at home for covid. Mild st when swallows. Some pnd at night. Some recent sneezing. Mild dry cough.   No fever, no chills,no sweats or bodyaches.  Only smoking 4 cigarettes a day.   Recent high level stress.   Review of Systems  Constitutional:  Negative for chills, diaphoresis and fatigue.  HENT:  Positive for postnasal drip. Negative for congestion and ear discharge.   Respiratory:  Negative for cough, chest tightness, shortness of breath and wheezing.   Cardiovascular:  Negative for chest pain and palpitations.  Gastrointestinal:  Negative for abdominal pain.  Musculoskeletal:  Negative for back pain and gait problem.  Neurological:  Negative for dizziness, light-headedness and headaches.    Past Medical History:  Diagnosis Date   Chicken pox    Environmental allergies    GERD (gastroesophageal reflux disease)    Hypertension    Measles    Mumps    Seasonal allergies    Vaginal Pap smear, abnormal      Social History   Socioeconomic History   Marital status: Single    Spouse name: Not on file   Number of children: Not on file   Years of education: Not on file   Highest education level: Not on file  Occupational History   Not on file  Tobacco Use   Smoking status: Every Day    Packs/day: 1.00    Years: 30.00    Pack years: 30.00    Types: Cigarettes   Smokeless tobacco: Never  Vaping Use   Vaping Use: Never used  Substance and Sexual Activity   Alcohol use: Yes    Alcohol/week: 10.0 standard drinks    Types: 10 Shots of  liquor per week    Comment: daily   Drug use: No   Sexual activity: Never    Birth control/protection: Surgical  Other Topics Concern   Not on file  Social History Narrative   Not on file   Social Determinants of Health   Financial Resource Strain: Not on file  Food Insecurity: Not on file  Transportation Needs: Not on file  Physical Activity: Not on file  Stress: Not on file  Social Connections: Not on file  Intimate Partner Violence: Not on file    Past Surgical History:  Procedure Laterality Date   ESSURE TUBAL LIGATION     TONSILLECTOMY  1984   WISDOM TOOTH EXTRACTION      Family History  Problem Relation Age of Onset   Healthy Mother        Living   Healthy Father        Living   Diabetes Maternal Grandfather        Borderline   COPD Maternal Aunt    COPD Maternal Uncle    Alzheimer's disease Maternal Grandmother    Alzheimer's disease Paternal Grandfather    Healthy Brother        x1   Allergies Son        x1    Allergies  Allergen Reactions  Other Anaphylaxis    Truffle Oils   Penicillins Anaphylaxis    Current Outpatient Medications on File Prior to Visit  Medication Sig Dispense Refill   albuterol (PROVENTIL HFA;VENTOLIN HFA) 108 (90 Base) MCG/ACT inhaler Inhale 2 puffs into the lungs every 6 (six) hours as needed for wheezing or shortness of breath. 1 Inhaler 2   amLODipine (NORVASC) 10 MG tablet TAKE 1 TABLET BY MOUTH EVERY DAY 90 tablet 0   azelastine (ASTELIN) 0.1 % nasal spray Place 2 sprays into both nostrils 2 (two) times daily. Use in each nostril as directed 30 mL 3   buPROPion (WELLBUTRIN XL) 150 MG 24 hr tablet TAKE 1 TABLET BY MOUTH EVERY DAY 30 tablet 5   cetirizine (ZYRTEC) 10 MG tablet Take 10 mg by mouth daily.     clobetasol ointment (TEMOVATE) [id number] % Apply 1 application topically 2 (two) times daily. 30 g 0   clonazePAM (KLONOPIN) 0.5 MG tablet Take 1 tablet (0.5 mg total) by mouth 2 (two) times daily as needed for anxiety. 14  tablet 0   ezetimibe (ZETIA) 10 MG tablet Take 1 tablet (10 mg total) by mouth daily. 30 tablet 3   FLUoxetine (PROZAC) 20 MG capsule TAKE 3 CAPSULES BY MOUTH DAILY 90 capsule 3   fluticasone (FLONASE) 50 MCG/ACT nasal spray Place 2 sprays into both nostrils daily. 16 g 2   levocetirizine (XYZAL) 5 MG tablet Take 1 tablet (5 mg total) by mouth every evening. 30 tablet 3   losartan (COZAAR) 100 MG tablet TAKE 1 TABLET BY MOUTH EVERY DAY 30 tablet 3   nicotine (NICODERM CQ - DOSED IN MG/24 HR) 7 mg/24hr patch Place 1 patch (7 mg total) onto the skin daily. 28 patch 0   omeprazole (PRILOSEC) 20 MG capsule TAKE 1 CAPSULE BY MOUTH DAILY FOR ACID REFLUX 90 capsule 0   potassium chloride SA (KLOR-CON) 20 MEQ tablet Take 1 tablet (20 mEq total) by mouth daily. 30 tablet 3   rosuvastatin (CRESTOR) 10 MG tablet TAKE 1 TABLET BY MOUTH EVERY DAY FOR CHOLESTEROL 30 tablet 3   tiZANidine (ZANAFLEX) 4 MG tablet Take 1 tablet (4 mg total) by mouth at bedtime. 15 tablet 0   traZODone (DESYREL) 50 MG tablet Take 0.5 tablets (25 mg total) by mouth at bedtime as needed for sleep. 30 tablet 1   No current facility-administered medications on file prior to visit.    BP 126/60 (BP Location: Left Arm, Patient Position: Sitting, Cuff Size: Large)   Pulse 72   Temp 98.2 F (36.8 C) (Oral)   Resp 18   Ht '5\' 4"'$  (1.626 m)   Wt 196 lb (88.9 kg)   SpO2 98%   BMI 33.64 kg/m       Objective:   Physical Exam  General Mental Status- Alert. General Appearance- Not in acute distress.   Skin General: Color- Normal Color. Moisture- Normal Moisture.  Neck Carotid Arteries- Normal color. Moisture- Normal Moisture. No carotid bruits. No JVD.  Chest and Lung Exam Auscultation: Breath Sounds:-Normal.  Cardiovascular Auscultation:Rythm- Regular. Murmurs & Other Heart Sounds:Auscultation of the heart reveals- No Murmurs.  Abdomen Inspection:-Inspeection Normal. Palpation/Percussion:Note:No mass. Palpation and  Percussion of the abdomen reveal- Non Tender, Non Distended + BS, no rebound or guarding.   Neurologic Cranial Nerve exam:- CN III-XII intact(No nystagmus), symmetric smile. Strength:- 5/5 equal and symmetric strength both upper and lower extremities.   Heent-left side sinus pressure on exam.. No redness to pharynx or hypertrophy. Mild pnd. Rt  side of tongue mild glossitis appearance.   Skin-upper arm bilateral rash.  Left thoracic area worse than right side.  On left side papular type eruption.    Assessment & Plan:   History of persisting bilateral upper extremity rash with itching since June.  Considering probable allergic reaction with recurrent exposure to causative agent.  Though nothing identified presently.  Will prescribe 6-day taper dose of prednisone, recommend continue to moisturize area well and prescribing hydroxyzine for itching.  Rx advisement given.  Recent very stressful life events.  Hydroxyzine can be used for anxiety as well.  If stress or anxiety worsens let us know.  Mild sore tongue right side.  You mention possibly biting the tongue.  On exam has glossitis type appearance.  We will get B12, B1 and folate level.  If tongue soreness persistent studies are negative then consider referral to ENT.  Mild sore throat recently with some left-sided maxillary sinus pressure.  Will prescribe azithromycin antibiotic.  Recently COVID test negative on Tuesday.  If your symptoms persist despite azithromycin antibiotic or symptoms change/worsen then repeat over-the-counter rapid COVID test.  Follow-up in 10 to 14 days or sooner if needed.  Mackie Pai, PA-C

## 2021-06-27 NOTE — Patient Instructions (Signed)
History of persisting bilateral upper extremity rash with itching since June.  Considering probable allergic reaction with recurrent exposure to causative agent.  Though nothing identified presently.  Will prescribe 6-day taper dose of prednisone, recommend continue to moisturize area well and prescribing hydroxyzine for itching.  Rx advisement given.  Recent very stressful life events.  Hydroxyzine can be used for anxiety as well.  If stress or anxiety worsens let us know.  Mild sore tongue right side.  You mention possibly biting the tongue.  On exam has glossitis type appearance.  We will get B12, B1 and folate level.  If tongue soreness persistent studies are negative then consider referral to ENT.  Mild sore throat recently with some left-sided maxillary sinus pressure.  Will prescribe azithromycin antibiotic.  Recently COVID test negative on Tuesday.  If your symptoms persist despite azithromycin antibiotic or symptoms change/worsen then repeat over-the-counter rapid COVID test.  Follow-up in 10 to 14 days or sooner if needed.

## 2021-06-27 NOTE — Addendum Note (Signed)
Addended by: Anabel Halon on: 06/27/2021 10:31 AM   Modules accepted: Orders

## 2021-07-03 LAB — VITAMIN B1: Vitamin B1 (Thiamine): 8 nmol/L (ref 8–30)

## 2021-07-04 ENCOUNTER — Other Ambulatory Visit: Payer: Self-pay

## 2021-07-04 ENCOUNTER — Telehealth (INDEPENDENT_AMBULATORY_CARE_PROVIDER_SITE_OTHER): Payer: 59 | Admitting: Medical

## 2021-07-04 ENCOUNTER — Other Ambulatory Visit (HOSPITAL_BASED_OUTPATIENT_CLINIC_OR_DEPARTMENT_OTHER): Payer: Self-pay

## 2021-07-04 DIAGNOSIS — U071 COVID-19: Secondary | ICD-10-CM

## 2021-07-04 DIAGNOSIS — R059 Cough, unspecified: Secondary | ICD-10-CM | POA: Diagnosis not present

## 2021-07-04 MED ORDER — FLUTICASONE PROPIONATE 50 MCG/ACT NA SUSP: 2.0000 | Freq: Every day | NASAL | 1 refills | Status: DC

## 2021-07-04 MED ORDER — MOLNUPIRAVIR EUA 200MG CAPSULE: 4.0000 | ORAL_CAPSULE | Freq: Two times a day (BID) | ORAL | 0 refills | Status: DC

## 2021-07-04 MED ORDER — MOLNUPIRAVIR EUA 200MG CAPSULE
4.0000 | ORAL_CAPSULE | Freq: Two times a day (BID) | ORAL | 0 refills | Status: AC
  Filled 2021-07-04: qty 40, 5d supply, fill #0

## 2021-07-04 MED ORDER — BENZONATATE 100 MG PO CAPS: 100.0000 mg | ORAL_CAPSULE | Freq: Three times a day (TID) | ORAL | 0 refills | Status: DC | PRN

## 2021-07-04 MED ORDER — AZITHROMYCIN 250 MG PO TABS: ORAL_TABLET | ORAL | 0 refills | Status: AC

## 2021-07-04 NOTE — Progress Notes (Signed)
   Subjective:    Patient ID: Lauren Case, female    DOB: 02-05-1966, 55 y.o.   MRN: [id number]  HPI  Virtual Visit via Video Note  I connected with Lauren Case on 07/04/21 at 11:00 AM EDT by a video enabled telemedicine application and verified that I am speaking with the correct person using two identifiers.  Location: Patient: home Provider: office   I discussed the limitations of evaluation and management by telemedicine and the availability of in person appointments. The patient expressed understanding and agreed to proceed.  History of Present Illness: Pt recently diagnosed with covid on 07-03-21.  Test used-rapid home test.  History of covid vaccine x 3.  Covid risk score- 2  History of covid infection- never.  Current symptoms-nasal congestion, fatigue and mild st. Diffuse body aches. No shortness of breath or wheezing. No fever presently but at night subjective.  Cough moderate to severe. Hard to sleep due to cough.  Pt 02 sat- does not have. Made aware to get and check 02.     Observations/Objective:  General-no acute distress, pleasant, oriented. Lungs- on inspection lungs appear unlabored. Neck- no tracheal deviation or jvd on inspection. Neuro- gross motor function appears intact.   Assessment and Plan:  Patient Instructions  Mild to moderate early COVID infection and patient vaccinated x3.  Some mild productive cough she does smoke.  Recommend getting O2 sat monitor and check O2 sats daily.  Notify us if O2 sats less than 96%.  Explained antiviral medication molnupiravir available and best to start within 5 days of symptom onset.  Explained benefit versus risk and emergency authorization use status.  After discussion decided to go ahead and send the medication in.  For cough prescribed benzonatate.  Making azithromycin available if her productive cough becomes worse or if develops chest congestion.  Explained any quarantine guidelines  per CDC.  Patient is going to follow her work return to work guidelines as they have different protocol.  Follow-up in 7 days or sooner if needed.   Time spent with patient today was 31 minutes which consisted of chart review, discussing diagnosis, work up treatment and documentation.  Follow Up Instructions:    I discussed the assessment and treatment plan with the patient. The patient was provided an opportunity to ask questions and all were answered. The patient agreed with the plan and demonstrated an understanding of the instructions.   The patient was advised to call back or seek an in-person evaluation if the symptoms worsen or if the condition fails to improve as anticipated.  Time spent with patient today was   minutes which consisted of chart revdiew, discussing diagnosis, work up treatment and documentation.    Mackie Pai, PA-C    Review of Systems     Objective:   Physical Exam        Assessment & Plan:

## 2021-07-05 NOTE — Patient Instructions (Signed)
Mild to moderate early COVID infection and patient vaccinated x3.  Some mild productive cough she does smoke.  Recommend getting O2 sat monitor and check O2 sats daily.  Notify us if O2 sats less than 96%.  Explained antiviral medication molnupiravir available and best to start within 5 days of symptom onset.  Explained benefit versus risk and emergency authorization use status.  After discussion decided to go ahead and send the medication in.  For cough prescribed benzonatate.  Making azithromycin available if her productive cough becomes worse or if develops chest congestion.  Explained any quarantine guidelines per CDC.  Patient is going to follow her work return to work guidelines as they have different protocol.  Follow-up in 7 days or sooner if needed.

## 2021-07-12 ENCOUNTER — Other Ambulatory Visit (HOSPITAL_BASED_OUTPATIENT_CLINIC_OR_DEPARTMENT_OTHER): Payer: Self-pay

## 2021-08-05 ENCOUNTER — Other Ambulatory Visit: Payer: Self-pay | Admitting: Medical

## 2021-08-09 ENCOUNTER — Other Ambulatory Visit: Payer: Self-pay

## 2021-08-09 ENCOUNTER — Ambulatory Visit (HOSPITAL_BASED_OUTPATIENT_CLINIC_OR_DEPARTMENT_OTHER)
Admission: RE | Admit: 2021-08-09 | Discharge: 2021-08-09 | Disposition: A | Discharge status: Home / Self Care | Payer: 59 | Source: Ambulatory Visit | Attending: Medical | Admitting: Medical

## 2021-08-09 ENCOUNTER — Ambulatory Visit: Payer: 59 | Admitting: Medical

## 2021-08-09 VITALS — BP 137/64 | HR 82 | Temp 98.2°F | Resp 18 | Ht 64.0 in | Wt 200.4 lb

## 2021-08-09 DIAGNOSIS — M79671 Pain in right foot: Secondary | ICD-10-CM | POA: Diagnosis present

## 2021-08-09 DIAGNOSIS — M79672 Pain in left foot: Secondary | ICD-10-CM | POA: Diagnosis present

## 2021-08-09 DIAGNOSIS — M79676 Pain in unspecified toe(s): Secondary | ICD-10-CM | POA: Diagnosis present

## 2021-08-09 DIAGNOSIS — K140 Glossitis: Secondary | ICD-10-CM

## 2021-08-09 NOTE — Progress Notes (Signed)
Subjective:    Patient ID: Lauren Case, female    DOB: 1966/10/08, 55 y.o.   MRN: [id number]  HPI  Pt has bilateral feet and  2nd toe pain for about 3 weeks.   Pt states like someone stomping on those toes. No back pain or radiating pain.  No other joint pains.  Pt tried tylenol and did not help.  Rt 2nd toe hurts worse.   Pain at times 7-8/10    Review of Systems  Constitutional:  Negative for chills, diaphoresis, fatigue and fever.  Cardiovascular:  Negative for chest pain and palpitations.  Gastrointestinal:  Negative for abdominal pain and anal bleeding.  Musculoskeletal:  Negative for back pain, joint swelling, neck pain and neck stiffness.       Foot pain  Skin:  Negative for pallor.   Past Medical History:  Diagnosis Date   Chicken pox    Environmental allergies    GERD (gastroesophageal reflux disease)    Hypertension    Measles    Mumps    Seasonal allergies    Vaginal Pap smear, abnormal      Social History   Socioeconomic History   Marital status: Single    Spouse name: Not on file   Number of children: Not on file   Years of education: Not on file   Highest education level: Not on file  Occupational History   Not on file  Tobacco Use   Smoking status: Every Day    Packs/day: 1.00    Years: 30.00    Pack years: 30.00    Types: Cigarettes   Smokeless tobacco: Never  Vaping Use   Vaping Use: Never used  Substance and Sexual Activity   Alcohol use: Yes    Alcohol/week: 10.0 standard drinks    Types: 10 Shots of liquor per week    Comment: daily   Drug use: No   Sexual activity: Never    Birth control/protection: Surgical  Other Topics Concern   Not on file  Social History Narrative   Not on file   Social Determinants of Health   Financial Resource Strain: Not on file  Food Insecurity: Not on file  Transportation Needs: Not on file  Physical Activity: Not on file  Stress: Not on file  Social Connections: Not on file   Intimate Partner Violence: Not on file    Past Surgical History:  Procedure Laterality Date   ESSURE TUBAL LIGATION     TONSILLECTOMY  1984   WISDOM TOOTH EXTRACTION      Family History  Problem Relation Age of Onset   Healthy Mother        Living   Healthy Father        Living   Diabetes Maternal Grandfather        Borderline   COPD Maternal Aunt    COPD Maternal Uncle    Alzheimer's disease Maternal Grandmother    Alzheimer's disease Paternal Grandfather    Healthy Brother        x1   Allergies Son        x1    Allergies  Allergen Reactions   Other Anaphylaxis    Truffle Oils   Penicillins Anaphylaxis    Current Outpatient Medications on File Prior to Visit  Medication Sig Dispense Refill   albuterol (PROVENTIL HFA;VENTOLIN HFA) 108 (90 Base) MCG/ACT inhaler Inhale 2 puffs into the lungs every 6 (six) hours as needed for wheezing or shortness of breath. 1  Inhaler 2   amLODipine (NORVASC) 10 MG tablet TAKE 1 TABLET BY MOUTH EVERY DAY 90 tablet 0   azelastine (ASTELIN) 0.1 % nasal spray Place 2 sprays into both nostrils 2 (two) times daily. Use in each nostril as directed 30 mL 3   benzonatate (TESSALON) 100 MG capsule TAKE 1 CAPSULE BY MOUTH 3 TIMES DAILY ASNEEDED FOR COUGH 30 capsule 0   buPROPion (WELLBUTRIN XL) 150 MG 24 hr tablet TAKE 1 TABLET BY MOUTH EVERY DAY 30 tablet 5   cetirizine (ZYRTEC) 10 MG tablet Take 10 mg by mouth daily.     clobetasol ointment (TEMOVATE) 8.93 % Apply 1 application topically 2 (two) times daily. 30 g 0   clonazePAM (KLONOPIN) 0.5 MG tablet Take 1 tablet (0.5 mg total) by mouth 2 (two) times daily as needed for anxiety. 14 tablet 0   ezetimibe (ZETIA) 10 MG tablet Take 1 tablet (10 mg total) by mouth daily. 30 tablet 3   FLUoxetine (PROZAC) 20 MG capsule TAKE 3 CAPSULES BY MOUTH DAILY 90 capsule 3   fluticasone (FLONASE) 50 MCG/ACT nasal spray Place 2 sprays into both nostrils daily. 16 g 2   fluticasone (FLONASE) 50 MCG/ACT nasal  spray Place 2 sprays into both nostrils daily. 16 g 1   hydrOXYzine (ATARAX/VISTARIL) 10 MG tablet TAKE ONE TABLET BY MOUTH THREE TIMES DAILY AS NEEDED FOR ITCHING 30 tablet 0   levocetirizine (XYZAL) 5 MG tablet Take 1 tablet (5 mg total) by mouth every evening. 30 tablet 3   losartan (COZAAR) 100 MG tablet TAKE 1 TABLET BY MOUTH EVERY DAY 30 tablet 3   nicotine (NICODERM CQ - DOSED IN MG/24 HR) 7 mg/24hr patch Place 1 patch (7 mg total) onto the skin daily. 28 patch 0   omeprazole (PRILOSEC) 20 MG capsule TAKE 1 CAPSULE BY MOUTH DAILY FOR ACID REFLUX 90 capsule 0   potassium chloride SA (KLOR-CON) 20 MEQ tablet Take 1 tablet (20 mEq total) by mouth daily. 30 tablet 3   predniSONE (STERAPRED UNI-PAK 21 TAB) 10 MG (21) TBPK tablet Standard aper over 6 days. 21 tablet 0   rosuvastatin (CRESTOR) 10 MG tablet TAKE 1 TABLET BY MOUTH EVERY DAY FOR CHOLESTEROL 30 tablet 3   tiZANidine (ZANAFLEX) 4 MG tablet Take 1 tablet (4 mg total) by mouth at bedtime. 15 tablet 0   traZODone (DESYREL) 50 MG tablet Take 0.5 tablets (25 mg total) by mouth at bedtime as needed for sleep. 30 tablet 1   No current facility-administered medications on file prior to visit.    BP 137/64   Pulse 82   Temp 98.2 F (36.8 C)   Resp 18   Ht 5\' 4"  (1.626 m)   Wt 200 lb 6.4 oz (90.9 kg)   SpO2 100%   BMI 34.40 kg/m        Objective:   Physical Exam  General- No acute distress. Pleasant patient. Neck- Full range of motion, no jvd Lungs- Clear, even and unlabored. Heart- regular rate and rhythm. Neurologic- CNII- XII grossly intact.  Rt foot- 2nd metatarsal pain moderate to palpation. 2nd toe mild tender but not swollen. Left foot- mild 2nd metatarsal tender. Lt 2nd toe moderate tender. Skin on feet- not swollen or red.     Assessment & Plan:   For both feet and 2nd toe pain will get xray of both feet and uric acid level. Pending study results can use ibuprofen 400-800 mg every 8 hours as needed.  For hx of  glossititis with lower need b12 level wrote rx for b12 5000 mcg daily dose. Hopefully this will help you get reimbursed thru hsa.  Follow up within within a month or so for fasting wellness exam.

## 2021-08-09 NOTE — Addendum Note (Signed)
Addended by: Kelle Darting A on: 08/09/2021 02:21 PM   Modules accepted: Orders

## 2021-08-09 NOTE — Patient Instructions (Addendum)
For both feet and 2nd toe pain will get xray of both feet and uric acid level. Pending study results can use ibuprofen 400-800 mg every 8 hours as needed.  For hx of glossitis with lower need b12 level wrote rx for b12 5000 mcg daily dose. Hopefully this will help you get reimbursed thru hsa.  Follow up within within a month or so for fasting wellness exam.

## 2021-08-10 LAB — URIC ACID: Uric Acid, Serum: 5.5 mg/dL (ref 2.5–7.0)

## 2021-08-28 ENCOUNTER — Other Ambulatory Visit: Payer: Self-pay | Admitting: Medical

## 2021-08-28 DIAGNOSIS — K219 Gastro-esophageal reflux disease without esophagitis: Secondary | ICD-10-CM

## 2021-09-11 ENCOUNTER — Telehealth: Payer: Self-pay | Admitting: *Deleted

## 2021-09-11 ENCOUNTER — Other Ambulatory Visit: Payer: Self-pay

## 2021-09-11 ENCOUNTER — Ambulatory Visit: Payer: 59 | Admitting: Medical

## 2021-09-11 ENCOUNTER — Telehealth: Payer: Self-pay | Admitting: Medical

## 2021-09-11 VITALS — BP 132/52 | HR 83 | Temp 98.2°F | Resp 18 | Ht 64.0 in | Wt 197.0 lb

## 2021-09-11 DIAGNOSIS — R21 Rash and other nonspecific skin eruption: Secondary | ICD-10-CM

## 2021-09-11 DIAGNOSIS — Z1211 Encounter for screening for malignant neoplasm of colon: Secondary | ICD-10-CM | POA: Diagnosis not present

## 2021-09-11 DIAGNOSIS — D649 Anemia, unspecified: Secondary | ICD-10-CM

## 2021-09-11 DIAGNOSIS — Z1231 Encounter for screening mammogram for malignant neoplasm of breast: Secondary | ICD-10-CM

## 2021-09-11 DIAGNOSIS — K146 Glossodynia: Secondary | ICD-10-CM

## 2021-09-11 DIAGNOSIS — R739 Hyperglycemia, unspecified: Secondary | ICD-10-CM | POA: Diagnosis not present

## 2021-09-11 DIAGNOSIS — Z0000 Encounter for general adult medical examination without abnormal findings: Secondary | ICD-10-CM

## 2021-09-11 DIAGNOSIS — Z23 Encounter for immunization: Secondary | ICD-10-CM

## 2021-09-11 DIAGNOSIS — F17200 Nicotine dependence, unspecified, uncomplicated: Secondary | ICD-10-CM

## 2021-09-11 DIAGNOSIS — E538 Deficiency of other specified B group vitamins: Secondary | ICD-10-CM

## 2021-09-11 LAB — CBC WITH DIFFERENTIAL/PLATELET
Basophils Absolute: 0 10*3/uL (ref 0.0–0.1)
Basophils Relative: 0.5 % (ref 0.0–3.0)
Eosinophils Absolute: 0.1 10*3/uL (ref 0.0–0.7)
Eosinophils Relative: 1.3 % (ref 0.0–5.0)
HCT: 26.3 % — ABNORMAL LOW (ref 36.0–46.0)
Hemoglobin: 7.6 g/dL — CL (ref 12.0–15.0)
Lymphocytes Relative: 25.6 % (ref 12.0–46.0)
Lymphs Abs: 2.1 10*3/uL (ref 0.7–4.0)
MCHC: 28.8 g/dL — ABNORMAL LOW (ref 30.0–36.0)
MCV: 64 fl — ABNORMAL LOW (ref 78.0–100.0)
Monocytes Absolute: 0.7 10*3/uL (ref 0.1–1.0)
Monocytes Relative: 8.5 % (ref 3.0–12.0)
Neutro Abs: 5.3 10*3/uL (ref 1.4–7.7)
Neutrophils Relative %: 64.1 % (ref 43.0–77.0)
Platelets: 183 10*3/uL (ref 150.0–400.0)
RBC: 4.1 Mil/uL (ref 3.87–5.11)
RDW: 18.6 % — ABNORMAL HIGH (ref 11.5–15.5)
WBC: 8.3 10*3/uL (ref 4.0–10.5)

## 2021-09-11 LAB — COMPREHENSIVE METABOLIC PANEL
ALT: 35 U/L (ref 0–35)
AST: 83 U/L — ABNORMAL HIGH (ref 0–37)
Albumin: 4.3 g/dL (ref 3.5–5.2)
Alkaline Phosphatase: 101 U/L (ref 39–117)
BUN: 5 mg/dL — ABNORMAL LOW (ref 6–23)
CO2: 30 mEq/L (ref 19–32)
Calcium: 8.8 mg/dL (ref 8.4–10.5)
Chloride: 100 mEq/L (ref 96–112)
Creatinine, Ser: 0.56 mg/dL (ref 0.40–1.20)
GFR: 101 mL/min (ref >60.00)
Glucose, Bld: 100 mg/dL — ABNORMAL HIGH (ref 70–99)
Potassium: 2.9 mEq/L — ABNORMAL LOW (ref 3.5–5.1)
Sodium: 140 mEq/L (ref 135–145)
Total Bilirubin: 0.4 mg/dL (ref 0.2–1.2)
Total Protein: 7.2 g/dL (ref 6.0–8.3)

## 2021-09-11 LAB — LIPID PANEL
Cholesterol: 177 mg/dL (ref 0–200)
HDL: 35.7 mg/dL — ABNORMAL LOW (ref >39.00)
LDL Cholesterol: 121 mg/dL — ABNORMAL HIGH (ref 0–99)
NonHDL: 141.03
Total CHOL/HDL Ratio: 5
Triglycerides: 99 mg/dL (ref 0.0–149.0)
VLDL: 19.8 mg/dL (ref 0.0–40.0)

## 2021-09-11 LAB — VITAMIN B12: Vitamin B-12: 1315 pg/mL — ABNORMAL HIGH (ref 211–911)

## 2021-09-11 LAB — HEMOGLOBIN A1C: Hgb A1c MFr Bld: 5.6 % (ref 4.6–6.5)

## 2021-09-11 MED ORDER — POTASSIUM CHLORIDE CRYS ER 20 MEQ PO TBCR: 20.0000 meq | EXTENDED_RELEASE_TABLET | Freq: Two times a day (BID) | ORAL | 0 refills | Status: DC

## 2021-09-11 NOTE — Addendum Note (Signed)
Addended by: Anabel Halon on: 09/11/2021 03:30 PM   Modules accepted: Orders

## 2021-09-11 NOTE — Telephone Encounter (Signed)
On ent referral. Pt states prefers [facility].

## 2021-09-11 NOTE — Telephone Encounter (Signed)
Pt called and lab visit made for 09/13/21

## 2021-09-11 NOTE — Progress Notes (Addendum)
Subjective:    Patient ID: Lauren Case, female    DOB: [birth year], 55 y.o.   MRN: [id number]  HPI  Pt in for wellness exam.Pt is fasting.   Pt has not had colonoscopy. She will do now in past .   Pt will get screening mammogram. I placed order. She may get it done at work. If so please bring me copy of the report.  Pt will get pcv 20 vaccine.  Htn- bp controlled. Today.  High cholesterol- will check cmp and lipid panel. On crestor  Elevated liver enzymes in past.  Smoker. Still smoking 3-4 cigarettes.   Pt had 2 covid vaccines in the past and had covid infection twice.  Last visit had some glossitis on review of last note. Still painful rt side of tongue. Some rt side submandibular  lymph node area tenderness. Also some pain toward rt ear. No pain in teeth. No fever or chills. Pt is smoker.  On and off bilateral  upper ext skin rash since June. Partal response to moisturizer.  Review of Systems  Constitutional:  Negative for chills, fatigue and fever.  HENT:  Negative for congestion and drooling.   Respiratory:  Negative for cough, chest tightness, shortness of breath and wheezing.   Cardiovascular:  Negative for chest pain and palpitations.  Gastrointestinal:  Negative for abdominal pain, blood in stool, constipation, diarrhea and nausea.  Genitourinary:  Negative for dysuria and flank pain.  Musculoskeletal:  Negative for back pain and joint swelling.  Skin:  Negative for rash.  Neurological:  Negative for dizziness, facial asymmetry and headaches.  Hematological:  Negative for adenopathy. Does not bruise/bleed easily.  Psychiatric/Behavioral:  Negative for behavioral problems.     Past Medical History:  Diagnosis Date   Chicken pox    Environmental allergies    GERD (gastroesophageal reflux disease)    Hypertension    Measles    Mumps    Seasonal allergies    Vaginal Pap smear, abnormal      Social History   Socioeconomic History   Marital  status: Single    Spouse name: Not on file   Number of children: Not on file   Years of education: Not on file   Highest education level: Not on file  Occupational History   Not on file  Tobacco Use   Smoking status: Every Day    Packs/day: 1.00    Years: 30.00    Pack years: 30.00    Types: Cigarettes   Smokeless tobacco: Never  Vaping Use   Vaping Use: Never used  Substance and Sexual Activity   Alcohol use: Yes    Alcohol/week: 10.0 standard drinks    Types: 10 Shots of liquor per week    Comment: daily   Drug use: No   Sexual activity: Never    Birth control/protection: Surgical  Other Topics Concern   Not on file  Social History Narrative   Not on file   Social Determinants of Health   Financial Resource Strain: Not on file  Food Insecurity: Not on file  Transportation Needs: Not on file  Physical Activity: Not on file  Stress: Not on file  Social Connections: Not on file  Intimate Partner Violence: Not on file    Past Surgical History:  Procedure Laterality Date   ESSURE TUBAL LIGATION     TONSILLECTOMY  1984   WISDOM TOOTH EXTRACTION      Family History  Problem Relation Age of Onset  Healthy Mother        Living   Healthy Father        Living   Diabetes Maternal Grandfather        Borderline   COPD Maternal Aunt    COPD Maternal Uncle    Alzheimer's disease Maternal Grandmother    Alzheimer's disease Paternal Grandfather    Healthy Brother        x1   Allergies Son        x1    Allergies  Allergen Reactions   Other Anaphylaxis    Truffle Oils   Penicillins Anaphylaxis    Current Outpatient Medications on File Prior to Visit  Medication Sig Dispense Refill   albuterol (PROVENTIL HFA;VENTOLIN HFA) 108 (90 Base) MCG/ACT inhaler Inhale 2 puffs into the lungs every 6 (six) hours as needed for wheezing or shortness of breath. 1 Inhaler 2   amLODipine (NORVASC) 10 MG tablet Take 1 tablet (10 mg total) by mouth daily. Due for physical exam  90 tablet 0   azelastine (ASTELIN) 0.1 % nasal spray Place 2 sprays into both nostrils 2 (two) times daily. Use in each nostril as directed 30 mL 3   benzonatate (TESSALON) 100 MG capsule TAKE 1 CAPSULE BY MOUTH 3 TIMES DAILY ASNEEDED FOR COUGH 30 capsule 0   buPROPion (WELLBUTRIN XL) 150 MG 24 hr tablet TAKE 1 TABLET BY MOUTH EVERY DAY 30 tablet 5   cetirizine (ZYRTEC) 10 MG tablet Take 10 mg by mouth daily.     clobetasol ointment (TEMOVATE) 6.23 % Apply 1 application topically 2 (two) times daily. 30 g 0   clonazePAM (KLONOPIN) 0.5 MG tablet Take 1 tablet (0.5 mg total) by mouth 2 (two) times daily as needed for anxiety. 14 tablet 0   ezetimibe (ZETIA) 10 MG tablet Take 1 tablet (10 mg total) by mouth daily. 30 tablet 3   FLUoxetine (PROZAC) 20 MG capsule TAKE 3 CAPSULES BY MOUTH DAILY 90 capsule 3   fluticasone (FLONASE) 50 MCG/ACT nasal spray Place 2 sprays into both nostrils daily. 16 g 2   fluticasone (FLONASE) 50 MCG/ACT nasal spray Place 2 sprays into both nostrils daily. 16 g 1   hydrOXYzine (ATARAX/VISTARIL) 10 MG tablet TAKE ONE TABLET BY MOUTH THREE TIMES DAILY AS NEEDED FOR ITCHING 30 tablet 0   levocetirizine (XYZAL) 5 MG tablet Take 1 tablet (5 mg total) by mouth every evening. 30 tablet 3   losartan (COZAAR) 100 MG tablet Take 1 tablet (100 mg total) by mouth daily. Due for physical exam 90 tablet 0   nicotine (NICODERM CQ - DOSED IN MG/24 HR) 7 mg/24hr patch Place 1 patch (7 mg total) onto the skin daily. 28 patch 0   omeprazole (PRILOSEC) 20 MG capsule Take 1 capsule (20 mg total) by mouth daily. Due for physical exam 90 capsule 0   potassium chloride SA (KLOR-CON) 20 MEQ tablet Take 1 tablet (20 mEq total) by mouth daily. 30 tablet 3   predniSONE (STERAPRED UNI-PAK 21 TAB) 10 MG (21) TBPK tablet Standard aper over 6 days. 21 tablet 0   rosuvastatin (CRESTOR) 10 MG tablet TAKE 1 TABLET BY MOUTH EVERY DAY FOR CHOLESTEROL 30 tablet 3   tiZANidine (ZANAFLEX) 4 MG tablet Take 1  tablet (4 mg total) by mouth at bedtime. 15 tablet 0   traZODone (DESYREL) 50 MG tablet Take 0.5 tablets (25 mg total) by mouth at bedtime as needed for sleep. 30 tablet 1   No current facility-administered medications  on file prior to visit.    BP (!) 132/52   Pulse 83   Temp 98.2 F (36.8 C)   Resp 18   Ht 5\' 4"  (1.626 m)   Wt 197 lb (89.4 kg)   SpO2 98%   BMI 33.81 kg/m       Objective:   Physical Exam  General Mental Status- Alert. General Appearance- Not in acute distress.   Skin Upper arm dry flaky skin about 10 cm x 5 cm bilaterally  Neck Carotid Arteries- Normal color. Moisture- Normal Moisture. No carotid bruits. No JVD.  Chest and Lung Exam Auscultation: Breath Sounds:-Normal.  Cardiovascular Auscultation:Rythm- Regular. Murmurs & Other Heart Sounds:Auscultation of the heart reveals- No Murmurs.  Abdomen Inspection:-Inspeection Normal. Palpation/Percussion:Note:No mass. Palpation and Percussion of the abdomen reveal- Non Tender, Non Distended + BS, no rebound or guarding.   Neurologic Cranial Nerve exam:- CN III-XII intact(No nystagmus), symmetric smile. Strength:- 5/5 equal and symmetric strength both upper and lower extremities.         Assessment & Plan:   For you wellness exam today I have ordered cbc, cmp and lipid panel.  Vaccine today pcv 20.   Recommend exercise and healthy diet.  We will let you know lab results as they come in.  Follow up date appointment will be determined after lab review.    Htn well controlled. Continue  losartan.  Follow liver enzymes since elevated in the past.   Placed referral to gi for screening colonoscopy.  Mammogram order placed.  Mackie Pai, [place]   [id number] charge as did address/discussed  rt side tongue pain and placed referral to ENT. Also discussed smoking cessation briefly. Encouraged to quit completely. At end of exam also brought up recent chronic skin rash since June. She request  referral to derm.

## 2021-09-11 NOTE — Addendum Note (Signed)
Addended by: Jeronimo Greaves on: 09/11/2021 10:11 AM   Modules accepted: Orders

## 2021-09-11 NOTE — Addendum Note (Signed)
Addended by: Anabel Halon on: 09/11/2021 09:29 PM   Modules accepted: Orders

## 2021-09-11 NOTE — Addendum Note (Signed)
Addended by: Anabel Halon on: 09/11/2021 09:09 AM   Modules accepted: Orders

## 2021-09-11 NOTE — Patient Instructions (Addendum)
For you wellness exam today I have ordered cbc, cmp and lipid panel.  Vaccine today pcv 20.   Recommend exercise and healthy diet.  We will let you know lab results as they come in.  Follow up date appointment will be determined after lab review.    Htn well controlled. Continue  losartan.  Follow liver enzymes since elevated in the past.   Placed referral to gi for screening colonoscopy.  Mammogram order placed.  For rt side tongue pain placed referral to ENT.  Preventive Care 46-15 Years Old, Female Preventive care refers to lifestyle choices and visits with your health care provider that can promote health and wellness. Preventive care visits are also called wellness exams. What can I expect for my preventive care visit? Counseling Your health care provider may ask you questions about your: Medical history, including: Past medical problems. Family medical history. Pregnancy history. Current health, including: Menstrual cycle. Method of birth control. Emotional well-being. Home life and relationship well-being. Sexual activity and sexual health. Lifestyle, including: Alcohol, nicotine or tobacco, and drug use. Access to firearms. Diet, exercise, and sleep habits. Work and work Statistician. Sunscreen use. Safety issues such as seatbelt and bike helmet use. Physical exam Your health care provider will check your: Height and weight. These may be used to calculate your BMI (body mass index). BMI is a measurement that tells if you are at a healthy weight. Waist circumference. This measures the distance around your waistline. This measurement also tells if you are at a healthy weight and may help predict your risk of certain diseases, such as type 2 diabetes and high blood pressure. Heart rate and blood pressure. Body temperature. Skin for abnormal spots. What immunizations do I need? Vaccines are usually given at various ages, according to a schedule. Your health care  provider will recommend vaccines for you based on your age, medical history, and lifestyle or other factors, such as travel or where you work. What tests do I need? Screening Your health care provider may recommend screening tests for certain conditions. This may include: Lipid and cholesterol levels. Diabetes screening. This is done by checking your blood sugar (glucose) after you have not eaten for a while (fasting). Pelvic exam and Pap test. Hepatitis B test. Hepatitis C test. HIV (human immunodeficiency virus) test. STI (sexually transmitted infection) testing, if you are at risk. Lung cancer screening. Colorectal cancer screening. Mammogram. Talk with your health care provider about when you should start having regular mammograms. This may depend on whether you have a family history of breast cancer. BRCA-related cancer screening. This may be done if you have a family history of breast, ovarian, tubal, or peritoneal cancers. Bone density scan. This is done to screen for osteoporosis. Talk with your health care provider about your test results, treatment options, and if necessary, the need for more tests. Follow these instructions at home: Eating and drinking  Eat a diet that includes fresh fruits and vegetables, whole grains, lean protein, and low-fat dairy products. Take vitamin and mineral supplements as recommended by your health care provider. Do not drink alcohol if: Your health care provider tells you not to drink. You are pregnant, may be pregnant, or are planning to become pregnant. If you drink alcohol: Limit how much you have to 0-1 drink a day. Know how much alcohol is in your drink. In the [place] one drink equals one 12 oz bottle of beer (355 mL), one 5 oz glass of wine (148 mL), or one  1 oz glass of hard liquor (44 mL). Lifestyle Brush your teeth every morning and night with fluoride toothpaste. Floss one time each day. Exercise for at least 30 minutes 5 or more days  each week. Do not use any products that contain nicotine or tobacco. These products include cigarettes, chewing tobacco, and vaping devices, such as e-cigarettes. If you need help quitting, ask your health care provider. Do not use drugs. If you are sexually active, practice safe sex. Use a condom or other form of protection to prevent STIs. If you do not wish to become pregnant, use a form of birth control. If you plan to become pregnant, see your health care provider for a prepregnancy visit. Take aspirin only as told by your health care provider. Make sure that you understand how much to take and what form to take. Work with your health care provider to find out whether it is safe and beneficial for you to take aspirin daily. Find healthy ways to manage stress, such as: Meditation, yoga, or listening to music. Journaling. Talking to a trusted person. Spending time with friends and family. Minimize exposure to UV radiation to reduce your risk of skin cancer. Safety Always wear your seat belt while driving or riding in a vehicle. Do not drive: If you have been drinking alcohol. Do not ride with someone who has been drinking. When you are tired or distracted. While texting. If you have been using any mind-altering substances or drugs. Wear a helmet and other protective equipment during sports activities. If you have firearms in your house, make sure you follow all gun safety procedures. Seek help if you have been physically or sexually abused. What's next? Visit your health care provider once a year for an annual wellness visit. Ask your health care provider how often you should have your eyes and teeth checked. Stay up to date on all vaccines. This information is not intended to replace advice given to you by your health care provider. Make sure you discuss any questions you have with your health care provider. Document Revised: 04/17/2021 Document Reviewed: 04/17/2021 Elsevier Patient  Education  [place].

## 2021-09-11 NOTE — Telephone Encounter (Signed)
CRITICAL VALUE STICKER  CRITICAL VALUE:  hemoglobin 7.6  RECEIVER (on-site recipient of call):  Kelle Darting, [place] NOTIFIED:  09/11/21 @ 1:05pm  MESSENGER (representative from lab): Shari Prows.  MD NOTIFIED: Mackie Pai, PA-C  TIME OF NOTIFICATION: 1:06pm  RESPONSE:

## 2021-09-12 NOTE — Telephone Encounter (Signed)
Pt is coming in at 57

## 2021-09-13 ENCOUNTER — Other Ambulatory Visit: Payer: Self-pay

## 2021-09-13 ENCOUNTER — Other Ambulatory Visit (HOSPITAL_BASED_OUTPATIENT_CLINIC_OR_DEPARTMENT_OTHER): Payer: Self-pay | Admitting: Medical

## 2021-09-13 ENCOUNTER — Telehealth: Payer: Self-pay | Admitting: *Deleted

## 2021-09-13 ENCOUNTER — Other Ambulatory Visit (INDEPENDENT_AMBULATORY_CARE_PROVIDER_SITE_OTHER): Payer: 59

## 2021-09-13 DIAGNOSIS — D649 Anemia, unspecified: Secondary | ICD-10-CM | POA: Diagnosis not present

## 2021-09-13 DIAGNOSIS — Z1231 Encounter for screening mammogram for malignant neoplasm of breast: Secondary | ICD-10-CM

## 2021-09-13 LAB — CBC WITH DIFFERENTIAL/PLATELET
Basophils Absolute: 0.1 10*3/uL (ref 0.0–0.1)
Basophils Relative: 0.9 % (ref 0.0–3.0)
Eosinophils Absolute: 0.1 10*3/uL (ref 0.0–0.7)
Eosinophils Relative: 1.2 % (ref 0.0–5.0)
HCT: 26.1 % — ABNORMAL LOW (ref 36.0–46.0)
Hemoglobin: 7.4 g/dL — CL (ref 12.0–15.0)
Lymphocytes Relative: 22.1 % (ref 12.0–46.0)
Lymphs Abs: 1.9 10*3/uL (ref 0.7–4.0)
MCHC: 28.4 g/dL — ABNORMAL LOW (ref 30.0–36.0)
MCV: 63.3 fl — ABNORMAL LOW (ref 78.0–100.0)
Monocytes Absolute: 0.7 10*3/uL (ref 0.1–1.0)
Monocytes Relative: 7.7 % (ref 3.0–12.0)
Neutro Abs: 5.8 10*3/uL (ref 1.4–7.7)
Neutrophils Relative %: 68.1 % (ref 43.0–77.0)
Platelets: 189 10*3/uL (ref 150.0–400.0)
RBC: 4.13 Mil/uL (ref 3.87–5.11)
RDW: 18.9 % — ABNORMAL HIGH (ref 11.5–15.5)
WBC: 8.5 10*3/uL (ref 4.0–10.5)

## 2021-09-13 LAB — IRON,TIBC AND FERRITIN PANEL
%SAT: 3 % (calc) — ABNORMAL LOW (ref 16–45)
Ferritin: 6 ng/mL — ABNORMAL LOW (ref 16–232)
Iron: 17 ug/dL — ABNORMAL LOW (ref 45–160)
TIBC: 555 mcg/dL (calc) — ABNORMAL HIGH (ref 250–450)

## 2021-09-13 MED ORDER — IRON (FERROUS SULFATE) 325 (65 FE) MG PO TABS: ORAL_TABLET | ORAL | 1 refills | Status: DC

## 2021-09-13 NOTE — Telephone Encounter (Signed)
CRITICAL VALUE STICKER  CRITICAL VALUE: Hemoglobin 7.4  RECEIVER (on-site recipient of call): Kelle Darting, [place] NOTIFIED: 09/13/10 @9 :15am  MESSENGER (representative from lab): Hope  MD NOTIFIED:Edward  TIME OF NOTIFICATION:  RESPONSE:

## 2021-09-13 NOTE — Addendum Note (Signed)
Addended by: Manuela Schwartz on: 09/13/2021 08:12 AM   Modules accepted: Orders

## 2021-09-13 NOTE — Addendum Note (Signed)
Addended by: Anabel Halon on: 09/13/2021 09:31 AM   Modules accepted: Orders

## 2021-09-16 ENCOUNTER — Other Ambulatory Visit (HOSPITAL_BASED_OUTPATIENT_CLINIC_OR_DEPARTMENT_OTHER): Payer: Self-pay

## 2021-09-16 ENCOUNTER — Other Ambulatory Visit (HOSPITAL_BASED_OUTPATIENT_CLINIC_OR_DEPARTMENT_OTHER): Payer: Self-pay | Admitting: Medical

## 2021-09-16 ENCOUNTER — Other Ambulatory Visit: Payer: Self-pay | Admitting: Medical

## 2021-09-16 ENCOUNTER — Encounter: Payer: Self-pay | Admitting: Medical

## 2021-09-16 DIAGNOSIS — Z1231 Encounter for screening mammogram for malignant neoplasm of breast: Secondary | ICD-10-CM

## 2021-09-17 ENCOUNTER — Encounter: Payer: Self-pay | Admitting: Medical

## 2021-09-18 ENCOUNTER — Other Ambulatory Visit (INDEPENDENT_AMBULATORY_CARE_PROVIDER_SITE_OTHER): Payer: 59

## 2021-09-18 ENCOUNTER — Ambulatory Visit: Payer: 59 | Admitting: Medical

## 2021-09-18 ENCOUNTER — Telehealth: Payer: Self-pay | Admitting: *Deleted

## 2021-09-18 ENCOUNTER — Other Ambulatory Visit: Payer: Self-pay

## 2021-09-18 VITALS — BP 129/66 | HR 85 | Temp 98.6°F | Resp 18 | Ht 64.0 in | Wt 199.6 lb

## 2021-09-18 DIAGNOSIS — Z1211 Encounter for screening for malignant neoplasm of colon: Secondary | ICD-10-CM

## 2021-09-18 DIAGNOSIS — D649 Anemia, unspecified: Secondary | ICD-10-CM

## 2021-09-18 DIAGNOSIS — K921 Melena: Secondary | ICD-10-CM | POA: Diagnosis not present

## 2021-09-18 LAB — FECAL OCCULT BLOOD, IMMUNOCHEMICAL: Fecal Occult Bld: POSITIVE — AB

## 2021-09-18 NOTE — Patient Instructions (Addendum)
Recent new onset moderate to  severe anemia.  Hemoglobin/hematocrit in the 7.4/26 range.  This is just above the range when we started to consider transfusion.  Repeat blood work late last week showed that your hemoglobin and hematocrit were stable.  Repeating blood work today to verify that numbers are coming up some since starting iron tablets.   Your stool card stool card test came back positive for blood.  New referral to a gastroenterologist for colonoscopy included the information regarding new anemia and positive test for blood.  Their office is already try to contact you.  I want you to call our office tomorrow and have them review the new referral with updated information.  Hoping this will expedite their evaluation.  Expecting your hemoglobin and hematocrit to improve along with improvement of iron level.  If it is dropping rather than improving then will recommend evaluation at the emergency department as you might need transfusion.  Follow-up date to be determined after lab review.

## 2021-09-18 NOTE — Telephone Encounter (Signed)
Pt has appointment today.  

## 2021-09-18 NOTE — Telephone Encounter (Signed)
Received call from Hope at [facility] reporting positive IFOB.  

## 2021-09-18 NOTE — Progress Notes (Signed)
Subjective:    Patient ID: Lauren Case, female    DOB: 04-May-1966, 55 y.o.   MRN: [id number]  HPI  Pt in for follow up.  On recent labs she had anemia and I had repeat hb/hct before the weekend to make sure hb/hct was stable.   Also iron was low and she started iron tablets.    Pt in past had low iron as far back as 6 years.  Denies any history of blood transfusion.  No black or bloody stools prior to using iron.   Pt ifob test came back positive for blood.     Review of Systems  Constitutional:  Positive for fatigue.       Mild   Respiratory:  Negative for cough, chest tightness, shortness of breath and wheezing.   Cardiovascular:  Negative for chest pain and palpitations.  Gastrointestinal:  Negative for abdominal pain, constipation and nausea.  Musculoskeletal:  Negative for back pain.  Skin:  Negative for rash.  Neurological:  Negative for facial asymmetry and headaches.  Hematological:  Negative for adenopathy. Does not bruise/bleed easily.  Psychiatric/Behavioral:  Negative for behavioral problems and confusion.   Past Medical History:  Diagnosis Date   Chicken pox    Environmental allergies    GERD (gastroesophageal reflux disease)    Hypertension    Measles    Mumps    Seasonal allergies    Vaginal Pap smear, abnormal      Social History   Socioeconomic History   Marital status: Single    Spouse name: Not on file   Number of children: Not on file   Years of education: Not on file   Highest education level: Not on file  Occupational History   Not on file  Tobacco Use   Smoking status: Every Day    Packs/day: 1.00    Years: 30.00    Pack years: 30.00    Types: Cigarettes   Smokeless tobacco: Never  Vaping Use   Vaping Use: Never used  Substance and Sexual Activity   Alcohol use: Yes    Alcohol/week: 10.0 standard drinks    Types: 10 Shots of liquor per week    Comment: daily   Drug use: No   Sexual activity: Never    Birth  control/protection: Surgical  Other Topics Concern   Not on file  Social History Narrative   Not on file   Social Determinants of Health   Financial Resource Strain: Not on file  Food Insecurity: Not on file  Transportation Needs: Not on file  Physical Activity: Not on file  Stress: Not on file  Social Connections: Not on file  Intimate Partner Violence: Not on file    Past Surgical History:  Procedure Laterality Date   ESSURE TUBAL LIGATION     TONSILLECTOMY  1984   WISDOM TOOTH EXTRACTION      Family History  Problem Relation Age of Onset   Healthy Mother        Living   Healthy Father        Living   Diabetes Maternal Grandfather        Borderline   COPD Maternal Aunt    COPD Maternal Uncle    Alzheimer's disease Maternal Grandmother    Alzheimer's disease Paternal Grandfather    Healthy Brother        x1   Allergies Son        x1    Allergies  Allergen Reactions  Other Anaphylaxis    Truffle Oils   Penicillins Anaphylaxis    Current Outpatient Medications on File Prior to Visit  Medication Sig Dispense Refill   albuterol (PROVENTIL HFA;VENTOLIN HFA) 108 (90 Base) MCG/ACT inhaler Inhale 2 puffs into the lungs every 6 (six) hours as needed for wheezing or shortness of breath. 1 Inhaler 2   amLODipine (NORVASC) 10 MG tablet Take 1 tablet (10 mg total) by mouth daily. Due for physical exam 90 tablet 0   azelastine (ASTELIN) 0.1 % nasal spray Place 2 sprays into both nostrils 2 (two) times daily. Use in each nostril as directed 30 mL 3   benzonatate (TESSALON) 100 MG capsule TAKE 1 CAPSULE BY MOUTH 3 TIMES DAILY ASNEEDED FOR COUGH 30 capsule 0   buPROPion (WELLBUTRIN XL) 150 MG 24 hr tablet TAKE 1 TABLET BY MOUTH EVERY DAY 30 tablet 5   cetirizine (ZYRTEC) 10 MG tablet Take 10 mg by mouth daily.     clobetasol ointment (TEMOVATE) 2.70 % Apply 1 application topically 2 (two) times daily. 30 g 0   clonazePAM (KLONOPIN) 0.5 MG tablet Take 1 tablet (0.5 mg total)  by mouth 2 (two) times daily as needed for anxiety. 14 tablet 0   ezetimibe (ZETIA) 10 MG tablet Take 1 tablet (10 mg total) by mouth daily. 30 tablet 3   FLUoxetine (PROZAC) 20 MG capsule TAKE 3 CAPSULES BY MOUTH DAILY 90 capsule 3   fluticasone (FLONASE) 50 MCG/ACT nasal spray Place 2 sprays into both nostrils daily. 16 g 2   fluticasone (FLONASE) 50 MCG/ACT nasal spray Place 2 sprays into both nostrils daily. 16 g 1   hydrOXYzine (ATARAX/VISTARIL) 10 MG tablet TAKE ONE TABLET BY MOUTH THREE TIMES DAILY AS NEEDED FOR ITCHING 30 tablet 0   Iron, Ferrous Sulfate, 325 (65 Fe) MG TABS 1 tab po tid 90 tablet 1   levocetirizine (XYZAL) 5 MG tablet Take 1 tablet (5 mg total) by mouth every evening. 30 tablet 3   losartan (COZAAR) 100 MG tablet Take 1 tablet (100 mg total) by mouth daily. Due for physical exam 90 tablet 0   nicotine (NICODERM CQ - DOSED IN MG/24 HR) 7 mg/24hr patch Place 1 patch (7 mg total) onto the skin daily. 28 patch 0   omeprazole (PRILOSEC) 20 MG capsule Take 1 capsule (20 mg total) by mouth daily. Due for physical exam 90 capsule 0   potassium chloride SA (KLOR-CON) 20 MEQ tablet Take 1 tablet (20 mEq total) by mouth daily. 30 tablet 3   potassium chloride SA (KLOR-CON) 20 MEQ tablet Take 1 tablet (20 mEq total) by mouth 2 (two) times daily. 14 tablet 0   predniSONE (STERAPRED UNI-PAK 21 TAB) 10 MG (21) TBPK tablet Standard aper over 6 days. 21 tablet 0   rosuvastatin (CRESTOR) 10 MG tablet TAKE 1 TABLET BY MOUTH EVERY DAY FOR CHOLESTEROL 30 tablet 3   tiZANidine (ZANAFLEX) 4 MG tablet Take 1 tablet (4 mg total) by mouth at bedtime. 15 tablet 0   traZODone (DESYREL) 50 MG tablet Take 0.5 tablets (25 mg total) by mouth at bedtime as needed for sleep. 30 tablet 1   No current facility-administered medications on file prior to visit.    BP 129/66   Pulse 85   Temp 98.6 F (37 C)   Resp 18   Ht 5\' 4"  (1.626 m)   Wt 199 lb 9.6 oz (90.5 kg)   SpO2 96%   BMI 34.26 kg/m  Objective:   Physical Exam  General- No acute distress. Pleasant patient. Neck- Full range of motion, no jvd Lungs- Clear, even and unlabored. Heart- regular rate and rhythm. Neurologic- CNII- XII grossly intact.  Abdomen- soft, nt, nd, +bs, no rebound or guarding. Back- no cva tendernes.      Assessment & Plan:   Patient Instructions  Recent new onset moderate to  severe anemia.  Hemoglobin/hematocrit in the 7.4/26 range.  This is just above the range when we started to consider transfusion.  Repeat blood work late last week showed that your hemoglobin and hematocrit were stable.  Repeating blood work today to verify that numbers are coming up some since starting iron tablets.   Your stool card stool card test came back positive for blood.  New referral to a gastroenterologist for colonoscopy included the information regarding new anemia and positive test for blood.  Their office is already try to contact you.  I want you to call our office tomorrow and have them review the new referral with updated information.  Hoping this will expedite their evaluation.  Expecting your hemoglobin and hematocrit to improve along with improvement of iron level.  If it is dropping rather than improving then will recommend evaluation at the emergency department as you might need transfusion.  Follow-up date to be determined after lab review.   Mackie Pai, PA-C   Time spent with patient today was 30  minutes which consisted of chart review, discussing diagnosis, work up, treatment and documentation.

## 2021-09-19 LAB — CBC WITH DIFFERENTIAL/PLATELET
Basophils Absolute: 0.1 10*3/uL (ref 0.0–0.1)
Basophils Relative: 1.3 % (ref 0.0–3.0)
Eosinophils Absolute: 0.1 10*3/uL (ref 0.0–0.7)
Eosinophils Relative: 1.4 % (ref 0.0–5.0)
HCT: 27.6 % — ABNORMAL LOW (ref 36.0–46.0)
Hemoglobin: 8.1 g/dL — ABNORMAL LOW (ref 12.0–15.0)
Lymphocytes Relative: 19.7 % (ref 12.0–46.0)
Lymphs Abs: 2.1 10*3/uL (ref 0.7–4.0)
MCHC: 29.5 g/dL — ABNORMAL LOW (ref 30.0–36.0)
MCV: 67.7 fl — ABNORMAL LOW (ref 78.0–100.0)
Monocytes Absolute: 0.7 10*3/uL (ref 0.1–1.0)
Monocytes Relative: 6.7 % (ref 3.0–12.0)
Neutro Abs: 7.7 10*3/uL (ref 1.4–7.7)
Neutrophils Relative %: 70.9 % (ref 43.0–77.0)
Platelets: 142 10*3/uL — ABNORMAL LOW (ref 150.0–400.0)
RBC: 4.08 Mil/uL (ref 3.87–5.11)
RDW: 19.1 % — ABNORMAL HIGH (ref 11.5–15.5)
WBC: 10.8 10*3/uL — ABNORMAL HIGH (ref 4.0–10.5)

## 2021-09-19 LAB — IRON: Iron: 236 ug/dL — ABNORMAL HIGH (ref 42–145)

## 2021-10-01 ENCOUNTER — Other Ambulatory Visit: Payer: Self-pay | Admitting: Medical

## 2021-10-18 ENCOUNTER — Ambulatory Visit: Payer: 59 | Admitting: Gastroenterology

## 2021-10-22 ENCOUNTER — Ambulatory Visit (HOSPITAL_BASED_OUTPATIENT_CLINIC_OR_DEPARTMENT_OTHER): Payer: 59

## 2021-10-28 ENCOUNTER — Other Ambulatory Visit: Payer: Self-pay | Admitting: Medical

## 2021-10-28 DIAGNOSIS — K219 Gastro-esophageal reflux disease without esophagitis: Secondary | ICD-10-CM

## 2021-11-11 ENCOUNTER — Ambulatory Visit (HOSPITAL_BASED_OUTPATIENT_CLINIC_OR_DEPARTMENT_OTHER)
Admission: RE | Admit: 2021-11-11 | Discharge: 2021-11-11 | Disposition: A | Discharge status: Home / Self Care | Payer: 59 | Source: Ambulatory Visit | Attending: Medical | Admitting: Medical

## 2021-11-11 ENCOUNTER — Other Ambulatory Visit: Payer: Self-pay

## 2021-11-11 ENCOUNTER — Encounter (HOSPITAL_BASED_OUTPATIENT_CLINIC_OR_DEPARTMENT_OTHER): Payer: Self-pay

## 2021-11-11 DIAGNOSIS — Z1231 Encounter for screening mammogram for malignant neoplasm of breast: Secondary | ICD-10-CM | POA: Insufficient documentation

## 2021-11-13 ENCOUNTER — Other Ambulatory Visit: Payer: Self-pay | Admitting: Medical

## 2021-11-14 ENCOUNTER — Encounter: Payer: Self-pay | Admitting: Gastroenterology

## 2021-11-14 ENCOUNTER — Other Ambulatory Visit: Payer: Self-pay

## 2021-11-14 ENCOUNTER — Ambulatory Visit (INDEPENDENT_AMBULATORY_CARE_PROVIDER_SITE_OTHER): Payer: 59 | Admitting: Gastroenterology

## 2021-11-14 VITALS — BP 128/84 | HR 76 | Ht 64.0 in | Wt 196.2 lb

## 2021-11-14 DIAGNOSIS — R195 Other fecal abnormalities: Secondary | ICD-10-CM | POA: Diagnosis not present

## 2021-11-14 DIAGNOSIS — K760 Fatty (change of) liver, not elsewhere classified: Secondary | ICD-10-CM | POA: Diagnosis not present

## 2021-11-14 DIAGNOSIS — R7989 Other specified abnormal findings of blood chemistry: Secondary | ICD-10-CM

## 2021-11-14 DIAGNOSIS — D509 Iron deficiency anemia, unspecified: Secondary | ICD-10-CM

## 2021-11-14 NOTE — Progress Notes (Signed)
Chief Complaint: IDA  Referring Provider:  Mackie Pai, PA-C      ASSESSMENT AND PLAN;   #1. IDA with H + stools  #2. Abn LFTs d/t fatty liver. Given low plts and hepatomegaly, I suspect she may have developed liver cirrhosis.  #3. GERD with occ N/V, now with occ dysphagia.   Plan: -[place] abdo complete -EGD/colon -Stop ETOH and BC -Encouraged to lose wt. -May need liver WU thereafter. -Stop smoking. -CBC, CMP at the time of above procedures.   I discussed EGD/Colonoscopy- the indications, risks, alternatives and potential complications including, but not limited to, bleeding, infection, reaction to medication, damage to internal organs, cardiac and/or pulmonary problems, and perforation requiring surgery.The possibility that significant findings could be missed was explained. All ? were answered. The patient gives consent to proceed.  HPI:    Lauren Case is a 56 y.o. female  With HTN, anxiety/depression, HLD  New onset anemia (Hb 7.4, MCV 63 Nov 2022), started on iron supplements to Hb 8.1.  Found to have H + stools.  Feels better.  Has not been craving ice/olives anymore.  Longstanding history of reflux and has been on omeprazole for years. Now over last 1 month with occasional dysphagia mostly to solids, mid chest.  Feels like "lump in the throat" which "keeps her from burping".  Occ N/V.  No hematemesis or melena.  Has alternating diarrhea and constipation since Nov 2022, ever since he has been on iron supplements.  No abdominal pain.  Occ abdominal bloating.  BCs q day.  ETOH 1-2 oz/day x over 20 yrs.  Cousin late 59s had liver transplant d/t NASH  Gained weight 165 (2012) to 196lb today.  She did lose weight when she quit alcohol for some time.  No jaundice dark urine or pale stools.  No itching.  Does have longstanding history of easy bruisability.  Currently in menopause.  No menstrual cycles.  No nosebleeds.   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  Had vaccines for hepatitis B at that time.  Past Medical History:  Diagnosis Date   Chicken pox    Environmental allergies    GERD (gastroesophageal reflux disease)    Hypertension    Measles    Mumps    Seasonal allergies    Vaginal Pap smear, abnormal     Past Surgical History:  Procedure Laterality Date   ESSURE TUBAL LIGATION     TONSILLECTOMY  1984   WISDOM TOOTH EXTRACTION      Family History  Problem Relation Age of Onset   Healthy Mother        Living   Healthy Father        Living   Healthy Brother        x1   Alzheimer's disease Maternal Grandmother    Diabetes Maternal Grandfather        Borderline   Alzheimer's disease Paternal Grandfather    Allergies Son        x1   COPD Maternal Aunt    COPD Maternal Uncle    Colon cancer Neg Hx    Rectal cancer Neg Hx    Stomach cancer Neg Hx    Esophageal cancer Neg Hx     Social History   Tobacco Use   Smoking status: Every Day    Packs/day: 1.00    Years: 30.00    Pack years: 30.00    Types: Cigarettes   Smokeless tobacco:  Never  Vaping Use   Vaping Use: Never used  Substance Use Topics   Alcohol use: Yes    Alcohol/week: 10.0 standard drinks    Types: 10 Shots of liquor per week    Comment: daily   Drug use: No    Current Outpatient Medications  Medication Sig Dispense Refill   albuterol (PROVENTIL HFA;VENTOLIN HFA) 108 (90 Base) MCG/ACT inhaler Inhale 2 puffs into the lungs every 6 (six) hours as needed for wheezing or shortness of breath. 1 Inhaler 2   amLODipine (NORVASC) 10 MG tablet Take 1 tablet (10 mg total) by mouth daily. Due for physical exam 90 tablet 0   buPROPion (WELLBUTRIN XL) 150 MG 24 hr tablet TAKE 1 TABLET BY MOUTH EVERY DAY 30 tablet 5   cetirizine (ZYRTEC) 10 MG tablet Take 10 mg by mouth daily.     clonazePAM (KLONOPIN) 0.5 MG tablet Take 1 tablet (0.5 mg total) by mouth 2 (two) times daily as needed for anxiety. 14 tablet 0   ezetimibe  (ZETIA) 10 MG tablet Take 1 tablet (10 mg total) by mouth daily. 30 tablet 3   FEROSUL 325 (65 Fe) MG tablet TAKE 1 TABLET BY MOUTH 3 TIMES DAILY (Patient taking differently: Take 1 time daily) 90 tablet 1   FLUoxetine (PROZAC) 20 MG capsule TAKE 3 CAPSULES BY MOUTH DAILY 90 capsule 3   hydrOXYzine (ATARAX/VISTARIL) 10 MG tablet TAKE ONE TABLET BY MOUTH THREE TIMES DAILY AS NEEDED FOR ITCHING 30 tablet 0   levocetirizine (XYZAL) 5 MG tablet Take 1 tablet (5 mg total) by mouth every evening. 30 tablet 3   losartan (COZAAR) 100 MG tablet TAKE 1 TABLET BY MOUTH EVERY DAY 90 tablet 0   nicotine (NICODERM CQ - DOSED IN MG/24 HR) 7 mg/24hr patch Place 1 patch (7 mg total) onto the skin daily. 28 patch 0   omeprazole (PRILOSEC) 20 MG capsule TAKE 1 CAPSULE BY MOUTH DAILY FOR ACID REFLUX 90 capsule 0   rosuvastatin (CRESTOR) 10 MG tablet TAKE 1 TABLET BY MOUTH EVERY DAY FOR CHOLESTEROL 30 tablet 3   traZODone (DESYREL) 50 MG tablet Take 0.5 tablets (25 mg total) by mouth at bedtime as needed for sleep. 30 tablet 1   No current facility-administered medications for this visit.    Allergies  Allergen Reactions   Other Anaphylaxis    Truffle Oils   Penicillins Anaphylaxis    Review of Systems:  Constitutional: Denies fever, chills, diaphoresis, appetite change and has fatigue.  HEENT: Denies photophobia, eye pain, redness, hearing loss, ear pain, congestion, sore throat, rhinorrhea, sneezing, mouth sores, neck pain, neck stiffness and tinnitus.   Respiratory: Denies SOB, DOE, cough, chest tightness,  and wheezing.   Cardiovascular: Denies chest pain, palpitations and leg swelling.  Genitourinary: Denies dysuria, urgency, frequency, hematuria, flank pain and difficulty urinating.  Musculoskeletal: has myalgias, No back pain, joint swelling, arthralgias and gait problem.  Skin: No rash.  Neurological: Denies dizziness, seizures, syncope, weakness, light-headedness, numbness and headaches.   Hematological: Denies adenopathy. Easy bruising, personal or family bleeding history  Psychiatric/Behavioral: Has anxiety or depression     Physical Exam:    BP 128/84    Pulse 76    Ht 5\' 4"  (1.626 m)    Wt 196 lb 4 oz (89 kg)    SpO2 95%    BMI 33.69 kg/m  Wt Readings from Last 3 Encounters:  11/14/21 196 lb 4 oz (89 kg)  09/18/21 199 lb 9.6 oz (90.5 kg)  09/11/21 197 lb (89.4 kg)   Constitutional:  Well-developed, in no acute distress. Psychiatric: Normal mood and affect. Behavior is normal. HEENT: Pupils normal.  Conjunctivae are normal. No scleral icterus. Cardiovascular: Normal rate, regular rhythm. No edema Pulmonary/chest: Effort normal and breath sounds normal. No wheezing, rales or rhonchi. Abdominal: Soft, nondistended. Nontender. Bowel sounds active throughout. There are no masses palpable. Hepatomegaly-liver palpated 6 cm in the midline.  No ascites.  Prominent abdominal veins. Rectal: Deferred Neurological: Alert and oriented to person place and time. Skin: Skin is warm and dry. No rashes noted.  Data Reviewed: I have personally reviewed following labs and imaging studies  CBC: CBC Latest Ref Rng & Units 09/18/2021 09/13/2021 09/11/2021  WBC 4.0 - 10.5 K/uL 10.8(H) 8.5 8.3  Hemoglobin 12.0 - 15.0 g/dL 8.1 Repeated and verified X2.(L) 7.4 cL(LL) 7.6 Repeated and verified X2.(LL)  Hematocrit 36.0 - 46.0 % 27.6(L) 26.1 aL(L) 26.3(L)  Platelets 150.0 - 400.0 K/uL 142.0(L) 189.0 183.0    CMP: CMP Latest Ref Rng & Units 09/11/2021 07/18/2020 12/28/2019  Glucose 70 - 99 mg/dL 100(H) 86 84  BUN 6 - 23 mg/dL 5(L) 7 9  Creatinine 0.40 - 1.20 mg/dL 0.56 0.68 0.63  Sodium 135 - 145 mEq/L 140 141 140  Potassium 3.5 - 5.1 mEq/L 2.9(L) 3.5 3.3(L)  Chloride 96 - 112 mEq/L 100 100 98  CO2 19 - 32 mEq/L 30 29 32  Calcium 8.4 - 10.5 mg/dL 8.8 9.1 9.2  Total Protein 6.0 - 8.3 g/dL 7.2 7.0 6.8  Total Bilirubin 0.2 - 1.2 mg/dL 0.4 0.2 0.4  Alkaline Phos 39 - 117 U/L 101 - 107   AST 0 - 37 U/L 83(H) 102(H) 69(H)  ALT 0 - 35 U/L 35 61(H) 48(H)       Radiology Studies: MM 3D SCREEN BREAST BILATERAL  Result Date: 11/11/2021 CLINICAL DATA:  Screening. EXAM: DIGITAL SCREENING BILATERAL MAMMOGRAM WITH TOMOSYNTHESIS AND CAD TECHNIQUE: Bilateral screening digital craniocaudal and mediolateral oblique mammograms were obtained. Bilateral screening digital breast tomosynthesis was performed. The images were evaluated with computer-aided detection. COMPARISON:  Previous exam(s). ACR Breast Density Category c: The breast tissue is heterogeneously dense, which may obscure small masses. FINDINGS: There are no findings suspicious for malignancy. IMPRESSION: No mammographic evidence of malignancy. A result letter of this screening mammogram will be mailed directly to the patient. RECOMMENDATION: Screening mammogram in one year. (Code:SM-B-01Y) BI-RADS CATEGORY  1: Negative. Electronically Signed   By: Dorise Bullion III M.D.   On: 11/11/2021 18:50      Carmell Austria, MD 11/14/2021, 3:42 PM  Cc: Mackie Pai, PA-C

## 2021-11-14 NOTE — Patient Instructions (Signed)
If you are age 58 or older, your body mass index should be between 23-30. Your Body mass index is 33.69 kg/m. If this is out of the aforementioned range listed, please consider follow up with your Primary Care Provider.  If you are age 62 or younger, your body mass index should be between 19-25. Your Body mass index is 33.69 kg/m. If this is out of the aformentioned range listed, please consider follow up with your Primary Care Provider.   ________________________________________________________  The [facility] providers would like to encourage you to use [facility] to communicate with providers for non-urgent requests or questions.  Due to long hold times on the telephone, sending your provider a message by [facility] may be a faster and more efficient way to get a response.  Please allow 48 business hours for a response.  Please remember that this is for non-urgent requests.  _______________________________________________________  Dennis Bast have been scheduled for an abdominal ultrasound at [facility] (1st floor of hospital) on          at           . Please arrive 15 minutes prior to your appointment for registration. Make certain not to have anything to eat or drink 6 hours prior to your appointment. Should you need to reschedule your appointment, please contact radiology at [phone]. This test typically takes about 30 minutes to perform.   You have been scheduled for an endoscopy and colonoscopy. Please follow the written instructions given to you at your visit today. Please pick up your prep supplies at the pharmacy within the next 1-3 days. If you use inhalers (even only as needed), please bring them with you on the day of your procedure.  Stop iron supplement 10 days prior to procedure  Stop alcohol and BC powder please.  Thank you,  Dr. Jackquline Denmark

## 2021-11-15 ENCOUNTER — Encounter: Payer: Self-pay | Admitting: Gastroenterology

## 2021-11-15 MED ORDER — NA SULFATE-K SULFATE-MG SULF 17.5-3.13-1.6 GM/177ML PO SOLN: 1.0000 | Freq: Once | ORAL | 0 refills | Status: AC

## 2021-11-15 NOTE — Telephone Encounter (Signed)
Done and patient is aware she needs to schedule her [place] before the 23rd as well

## 2021-11-19 ENCOUNTER — Other Ambulatory Visit: Payer: Self-pay

## 2021-11-19 ENCOUNTER — Ambulatory Visit (HOSPITAL_BASED_OUTPATIENT_CLINIC_OR_DEPARTMENT_OTHER)
Admission: RE | Admit: 2021-11-19 | Discharge: 2021-11-19 | Disposition: A | Discharge status: Home / Self Care | Payer: 59 | Source: Ambulatory Visit | Attending: Gastroenterology | Admitting: Gastroenterology

## 2021-11-19 DIAGNOSIS — R7989 Other specified abnormal findings of blood chemistry: Secondary | ICD-10-CM | POA: Diagnosis present

## 2021-11-19 DIAGNOSIS — D509 Iron deficiency anemia, unspecified: Secondary | ICD-10-CM | POA: Insufficient documentation

## 2021-11-19 DIAGNOSIS — K760 Fatty (change of) liver, not elsewhere classified: Secondary | ICD-10-CM | POA: Diagnosis present

## 2021-11-19 DIAGNOSIS — R195 Other fecal abnormalities: Secondary | ICD-10-CM | POA: Insufficient documentation

## 2021-11-26 ENCOUNTER — Other Ambulatory Visit: Payer: Self-pay | Admitting: Medical

## 2021-11-27 ENCOUNTER — Other Ambulatory Visit: Payer: Self-pay

## 2021-11-27 ENCOUNTER — Encounter: Payer: Self-pay | Admitting: Gastroenterology

## 2021-11-27 ENCOUNTER — Other Ambulatory Visit (INDEPENDENT_AMBULATORY_CARE_PROVIDER_SITE_OTHER): Payer: 59

## 2021-11-27 ENCOUNTER — Ambulatory Visit (AMBULATORY_SURGERY_CENTER): Payer: 59 | Admitting: Gastroenterology

## 2021-11-27 VITALS — BP 119/72 | HR 75 | Temp 97.8°F | Resp 16 | Ht 64.0 in | Wt 196.0 lb

## 2021-11-27 DIAGNOSIS — K766 Portal hypertension: Secondary | ICD-10-CM | POA: Diagnosis not present

## 2021-11-27 DIAGNOSIS — K319 Disease of stomach and duodenum, unspecified: Secondary | ICD-10-CM

## 2021-11-27 DIAGNOSIS — K635 Polyp of colon: Secondary | ICD-10-CM | POA: Diagnosis not present

## 2021-11-27 DIAGNOSIS — K640 First degree hemorrhoids: Secondary | ICD-10-CM

## 2021-11-27 DIAGNOSIS — D123 Benign neoplasm of transverse colon: Secondary | ICD-10-CM

## 2021-11-27 DIAGNOSIS — D509 Iron deficiency anemia, unspecified: Secondary | ICD-10-CM

## 2021-11-27 DIAGNOSIS — K2970 Gastritis, unspecified, without bleeding: Secondary | ICD-10-CM

## 2021-11-27 DIAGNOSIS — K5730 Diverticulosis of large intestine without perforation or abscess without bleeding: Secondary | ICD-10-CM | POA: Diagnosis not present

## 2021-11-27 DIAGNOSIS — D122 Benign neoplasm of ascending colon: Secondary | ICD-10-CM

## 2021-11-27 LAB — CBC WITH DIFFERENTIAL/PLATELET
Basophils Absolute: 0 10*3/uL (ref 0.0–0.1)
Basophils Relative: 0.6 % (ref 0.0–3.0)
Eosinophils Absolute: 0.1 10*3/uL (ref 0.0–0.7)
Eosinophils Relative: 1.1 % (ref 0.0–5.0)
HCT: 47.5 % — ABNORMAL HIGH (ref 36.0–46.0)
Hemoglobin: 15.4 g/dL — ABNORMAL HIGH (ref 12.0–15.0)
Lymphocytes Relative: 21.3 % (ref 12.0–46.0)
Lymphs Abs: 1.4 10*3/uL (ref 0.7–4.0)
MCHC: 32.4 g/dL (ref 30.0–36.0)
MCV: 92.6 fl (ref 78.0–100.0)
Monocytes Absolute: 0.4 10*3/uL (ref 0.1–1.0)
Monocytes Relative: 6.1 % (ref 3.0–12.0)
Neutro Abs: 4.8 10*3/uL (ref 1.4–7.7)
Neutrophils Relative %: 70.9 % (ref 43.0–77.0)
Platelets: 147 10*3/uL — ABNORMAL LOW (ref 150.0–400.0)
RBC: 5.13 Mil/uL — ABNORMAL HIGH (ref 3.87–5.11)
RDW: 16.6 % — ABNORMAL HIGH (ref 11.5–15.5)
WBC: 6.7 10*3/uL (ref 4.0–10.5)

## 2021-11-27 LAB — COMPREHENSIVE METABOLIC PANEL
ALT: 62 U/L — ABNORMAL HIGH (ref 0–35)
AST: 131 U/L — ABNORMAL HIGH (ref 0–37)
Albumin: 4.2 g/dL (ref 3.5–5.2)
Alkaline Phosphatase: 102 U/L (ref 39–117)
BUN: 6 mg/dL (ref 6–23)
CO2: 27 mEq/L (ref 19–32)
Calcium: 9.3 mg/dL (ref 8.4–10.5)
Chloride: 100 mEq/L (ref 96–112)
Creatinine, Ser: 0.72 mg/dL (ref 0.40–1.20)
GFR: 92.4 mL/min (ref >60.00)
Glucose, Bld: 97 mg/dL (ref 70–99)
Potassium: 3.7 mEq/L (ref 3.5–5.1)
Sodium: 140 mEq/L (ref 135–145)
Total Bilirubin: 0.4 mg/dL (ref 0.2–1.2)
Total Protein: 7.4 g/dL (ref 6.0–8.3)

## 2021-11-27 MED ORDER — PANTOPRAZOLE SODIUM 40 MG PO TBEC: 40.0000 mg | DELAYED_RELEASE_TABLET | Freq: Every day | ORAL | 4 refills | Status: AC

## 2021-11-27 MED ORDER — SODIUM CHLORIDE 0.9 % IV SOLN: 500.0000 mL | Freq: Once | INTRAVENOUS | Status: DC

## 2021-11-27 NOTE — Patient Instructions (Signed)
Upper-Resume previous diet and medications. No aspirin, ibuprofen, BC's Naproxen or other non-steroidal anti-inflammatory drugs. Stop drinking alcohol. Check CBC and CMP today. Change Omeprazole to Protonix 40 mg daily. Need CT Abdomen/Pelvis with PO and IV contrast outpatient.  Lower-Return to GI office in 12 weeks. Awaiting pathology results. Repeat date for Colonoscopy to be determined based on pathology.  YOU HAD AN ENDOSCOPIC PROCEDURE TODAY AT [facility]:   Refer to the procedure report that was given to you for any specific questions about what was found during the examination.  If the procedure report does not answer your questions, please call your gastroenterologist to clarify.  If you requested that your care partner not be given the details of your procedure findings, then the procedure report has been included in a sealed envelope for you to review at your convenience later.  YOU SHOULD EXPECT: Some feelings of bloating in the abdomen. Passage of more gas than usual.  Walking can help get rid of the air that was put into your GI tract during the procedure and reduce the bloating. If you had a lower endoscopy (such as a colonoscopy or flexible sigmoidoscopy) you may notice spotting of blood in your stool or on the toilet paper. If you underwent a bowel prep for your procedure, you may not have a normal bowel movement for a few days.  Please Note:  You might notice some irritation and congestion in your nose or some drainage.  This is from the oxygen used during your procedure.  There is no need for concern and it should clear up in a day or so.  SYMPTOMS TO REPORT IMMEDIATELY:  Following lower endoscopy (colonoscopy or flexible sigmoidoscopy):  Excessive amounts of blood in the stool  Significant tenderness or worsening of abdominal pains  Swelling of the abdomen that is new, acute  Fever of 100F or higher  Following upper endoscopy (EGD)  Vomiting of blood or  coffee ground material  New chest pain or pain under the shoulder blades  Painful or persistently difficult swallowing  New shortness of breath  Fever of 100F or higher  Black, tarry-looking stools  For urgent or emergent issues, a gastroenterologist can be reached at any hour by calling [phone]. Do not use MyChart messaging for urgent concerns.    DIET:  We do recommend a small meal at first, but then you may proceed to your regular diet.  Drink plenty of fluids but you should avoid alcoholic beverages for 24 hours.  ACTIVITY:  You should plan to take it easy for the rest of today and you should NOT DRIVE or use heavy machinery until tomorrow (because of the sedation medicines used during the test).    FOLLOW UP: Our staff will call the number listed on your records 48-72 hours following your procedure to check on you and address any questions or concerns that you may have regarding the information given to you following your procedure. If we do not reach you, we will leave a message.  We will attempt to reach you two times.  During this call, we will ask if you have developed any symptoms of COVID 19. If you develop any symptoms (ie: fever, flu-like symptoms, shortness of breath, cough etc.) before then, please call [phone].  If you test positive for Covid 19 in the 2 weeks post procedure, please call and report this information to [place].    If any biopsies were taken you will be contacted by phone or  by letter within the next 1-3 weeks.  Please call us at [phone] if you have not heard about the biopsies in 3 weeks.    SIGNATURES/CONFIDENTIALITY: You and/or your care partner have signed paperwork which will be entered into your electronic medical record.  These signatures attest to the fact that that the information above on your After Visit Summary has been reviewed and is understood.  Full responsibility of the confidentiality of this discharge information lies with you  and/or your care-partner.

## 2021-11-27 NOTE — Progress Notes (Signed)
Pt's states no medical or surgical changes since previsit or office visit. 

## 2021-11-27 NOTE — Progress Notes (Signed)
1000: Pt vomited a moderate amount of blood colored fluid. Oral suctioning promptly performed. MD made aware.   K.Quincey Quesinberry, CRNA

## 2021-11-27 NOTE — Progress Notes (Signed)
Chief Complaint: IDA  Referring Provider:  Mackie Pai, PA-C      ASSESSMENT AND PLAN;   #1. IDA with H + stools  #2. Abn LFTs d/t fatty liver. Given low plts and hepatomegaly, I suspect she may have developed liver cirrhosis.  #3. GERD with occ N/V, now with occ dysphagia.   Plan: -[place] abdo complete -EGD/colon -Stop ETOH and BC -Encouraged to lose wt. -May need liver WU thereafter. -Stop smoking. -CBC, CMP at the time of above procedures.   I discussed EGD/Colonoscopy- the indications, risks, alternatives and potential complications including, but not limited to, bleeding, infection, reaction to medication, damage to internal organs, cardiac and/or pulmonary problems, and perforation requiring surgery.The possibility that significant findings could be missed was explained. All ? were answered. The patient gives consent to proceed.  HPI:    Lauren Case is a 56 y.o. female  With HTN, anxiety/depression, HLD  New onset anemia (Hb 7.4, MCV 63 Nov 2022), started on iron supplements to Hb 8.1.  Found to have H + stools.  Feels better.  Has not been craving ice/olives anymore.  Longstanding history of reflux and has been on omeprazole for years. Now over last 1 month with occasional dysphagia mostly to solids, mid chest.  Feels like "lump in the throat" which "keeps her from burping".  Occ N/V.  No hematemesis or melena.  Has alternating diarrhea and constipation since Nov 2022, ever since he has been on iron supplements.  No abdominal pain.  Occ abdominal bloating.  BCs q day.  ETOH 1-2 oz/day x over 20 yrs.  Cousin late 41s had liver transplant d/t NASH  Gained weight 165 (2012) to 196lb today.  She did lose weight when she quit alcohol for some time.  No jaundice dark urine or pale stools.  No itching.  Does have longstanding history of easy bruisability.  Currently in menopause.  No menstrual cycles.  No nosebleeds.   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  Had vaccines for hepatitis B at that time.  Past Medical History:  Diagnosis Date   Chicken pox    Environmental allergies    GERD (gastroesophageal reflux disease)    Hypertension    Measles    Mumps    Seasonal allergies    Vaginal Pap smear, abnormal     Past Surgical History:  Procedure Laterality Date   ESSURE TUBAL LIGATION     TONSILLECTOMY  1984   WISDOM TOOTH EXTRACTION      Family History  Problem Relation Age of Onset   Healthy Mother        Living   Healthy Father        Living   Healthy Brother        x1   Alzheimer's disease Maternal Grandmother    Diabetes Maternal Grandfather        Borderline   Alzheimer's disease Paternal Grandfather    Allergies Son        x1   COPD Maternal Aunt    COPD Maternal Uncle    Colon cancer Neg Hx    Rectal cancer Neg Hx    Stomach cancer Neg Hx    Esophageal cancer Neg Hx     Social History   Tobacco Use   Smoking status: Every Day    Packs/day: 1.00    Years: 30.00    Pack years: 30.00    Types: Cigarettes   Smokeless tobacco:  Never  Vaping Use   Vaping Use: Never used  Substance Use Topics   Alcohol use: Yes    Alcohol/week: 10.0 standard drinks    Types: 10 Shots of liquor per week    Comment: daily   Drug use: No    Current Outpatient Medications  Medication Sig Dispense Refill   amLODipine (NORVASC) 10 MG tablet TAKE 1 TABLET BY MOUTH EVERY DAY 90 tablet 0   buPROPion (WELLBUTRIN XL) 150 MG 24 hr tablet TAKE 1 TABLET BY MOUTH EVERY DAY 30 tablet 5   cetirizine (ZYRTEC) 10 MG tablet Take 10 mg by mouth daily.     FLUoxetine (PROZAC) 20 MG capsule TAKE 3 CAPSULES BY MOUTH DAILY 90 capsule 3   losartan (COZAAR) 100 MG tablet TAKE 1 TABLET BY MOUTH EVERY DAY 90 tablet 0   omeprazole (PRILOSEC) 20 MG capsule TAKE 1 CAPSULE BY MOUTH DAILY FOR ACID REFLUX 90 capsule 0   albuterol (PROVENTIL HFA;VENTOLIN HFA) 108 (90 Base) MCG/ACT inhaler Inhale 2 puffs into the lungs  every 6 (six) hours as needed for wheezing or shortness of breath. 1 Inhaler 2   clonazePAM (KLONOPIN) 0.5 MG tablet Take 1 tablet (0.5 mg total) by mouth 2 (two) times daily as needed for anxiety. 14 tablet 0   ezetimibe (ZETIA) 10 MG tablet Take 1 tablet (10 mg total) by mouth daily. 30 tablet 3   FEROSUL 325 (65 Fe) MG tablet TAKE 1 TABLET BY MOUTH 3 TIMES DAILY (Patient taking differently: Take 1 time daily) 90 tablet 1   hydrOXYzine (ATARAX/VISTARIL) 10 MG tablet TAKE ONE TABLET BY MOUTH THREE TIMES DAILY AS NEEDED FOR ITCHING 30 tablet 0   levocetirizine (XYZAL) 5 MG tablet Take 1 tablet (5 mg total) by mouth every evening. 30 tablet 3   nicotine (NICODERM CQ - DOSED IN MG/24 HR) 7 mg/24hr patch Place 1 patch (7 mg total) onto the skin daily. 28 patch 0   rosuvastatin (CRESTOR) 10 MG tablet TAKE 1 TABLET BY MOUTH EVERY DAY FOR CHOLESTEROL 30 tablet 3   traZODone (DESYREL) 50 MG tablet Take 0.5 tablets (25 mg total) by mouth at bedtime as needed for sleep. 30 tablet 1   Current Facility-Administered Medications  Medication Dose Route Frequency Provider Last Rate Last Admin   0.9 %  sodium chloride infusion  500 mL Intravenous Once Jackquline Denmark, MD        Allergies  Allergen Reactions   Other Anaphylaxis    Truffle Oils   Penicillins Anaphylaxis    Review of Systems:  Constitutional: Denies fever, chills, diaphoresis, appetite change and has fatigue.  HEENT: Denies photophobia, eye pain, redness, hearing loss, ear pain, congestion, sore throat, rhinorrhea, sneezing, mouth sores, neck pain, neck stiffness and tinnitus.   Respiratory: Denies SOB, DOE, cough, chest tightness,  and wheezing.   Cardiovascular: Denies chest pain, palpitations and leg swelling.  Genitourinary: Denies dysuria, urgency, frequency, hematuria, flank pain and difficulty urinating.  Musculoskeletal: has myalgias, No back pain, joint swelling, arthralgias and gait problem.  Skin: No rash.  Neurological: Denies  dizziness, seizures, syncope, weakness, light-headedness, numbness and headaches.  Hematological: Denies adenopathy. Easy bruising, personal or family bleeding history  Psychiatric/Behavioral: Has anxiety or depression     Physical Exam:    BP (!) 146/76    Pulse 74    Temp 97.8 F (36.6 C)    Ht 5\' 4"  (1.626 m)    Wt 196 lb (88.9 kg)    SpO2 96%  BMI 33.64 kg/m  Wt Readings from Last 3 Encounters:  11/27/21 196 lb (88.9 kg)  11/14/21 196 lb 4 oz (89 kg)  09/18/21 199 lb 9.6 oz (90.5 kg)   Constitutional:  Well-developed, in no acute distress. Psychiatric: Normal mood and affect. Behavior is normal. HEENT: Pupils normal.  Conjunctivae are normal. No scleral icterus. Cardiovascular: Normal rate, regular rhythm. No edema Pulmonary/chest: Effort normal and breath sounds normal. No wheezing, rales or rhonchi. Abdominal: Soft, nondistended. Nontender. Bowel sounds active throughout. There are no masses palpable. Hepatomegaly-liver palpated 6 cm in the midline.  No ascites.  Prominent abdominal veins. Rectal: Deferred Neurological: Alert and oriented to person place and time. Skin: Skin is warm and dry. No rashes noted.  Data Reviewed: I have personally reviewed following labs and imaging studies  CBC: CBC Latest Ref Rng & Units 09/18/2021 09/13/2021 09/11/2021  WBC 4.0 - 10.5 K/uL 10.8(H) 8.5 8.3  Hemoglobin 12.0 - 15.0 g/dL 8.1 Repeated and verified X2.(L) 7.4 cL(LL) 7.6 Repeated and verified X2.(LL)  Hematocrit 36.0 - 46.0 % 27.6(L) 26.1 aL(L) 26.3(L)  Platelets 150.0 - 400.0 K/uL 142.0(L) 189.0 183.0    CMP: CMP Latest Ref Rng & Units 09/11/2021 07/18/2020 12/28/2019  Glucose 70 - 99 mg/dL 100(H) 86 84  BUN 6 - 23 mg/dL 5(L) 7 9  Creatinine 0.40 - 1.20 mg/dL 0.56 0.68 0.63  Sodium 135 - 145 mEq/L 140 141 140  Potassium 3.5 - 5.1 mEq/L 2.9(L) 3.5 3.3(L)  Chloride 96 - 112 mEq/L 100 100 98  CO2 19 - 32 mEq/L 30 29 32  Calcium 8.4 - 10.5 mg/dL 8.8 9.1 9.2  Total Protein 6.0 -  8.3 g/dL 7.2 7.0 6.8  Total Bilirubin 0.2 - 1.2 mg/dL 0.4 0.2 0.4  Alkaline Phos 39 - 117 U/L 101 - 107  AST 0 - 37 U/L 83(H) 102(H) 69(H)  ALT 0 - 35 U/L 35 61(H) 48(H)       Radiology Studies: US Abdomen Complete  Result Date: 11/19/2021 CLINICAL DATA:  Hepatic steatosis. EXAM: ABDOMEN ULTRASOUND COMPLETE COMPARISON:  Ultrasound October 11, 2020 FINDINGS: Gallbladder: Gallbladder polyp measuring 5 mm. No gallstones or wall thickening visualized. No sonographic Murphy sign noted by sonographer. Common bile duct: Diameter: 4 mm Liver: No focal lesion identified. Diffusely increased parenchymal echogenicity. Portal vein is patent on color Doppler imaging with normal direction of blood flow towards the liver. IVC: No abnormality visualized. Pancreas: Visualized portion unremarkable. Spleen: Size and appearance within normal limits. Right Kidney: Length: 11.4 cm. Echogenicity within normal limits. No mass or hydronephrosis visualized. Left Kidney: Length: 12.6 cm. Echogenicity within normal limits. No mass or hydronephrosis visualized. Abdominal aorta: No aneurysm visualized. Other findings: None. IMPRESSION: 1. The liver is diffusely increased in echogenicity, suggesting fatty infiltration. There are no obvious focal liver lesions. 2. Gallbladder polyp measuring 5 mm. No further evaluation or follow-up necessary based on size criteria. Per consensus guidelines, this requires no additional evaluation or specific follow-up. This recommendation follows ACR consensus guidelines: White Paper of the ACR Incidental findings Committee II on Gallbladder and Biliary Findings. J Am Coll Radiol 2013:;10:953-956. Electronically Signed   By: Dahlia Bailiff M.D.   On: 11/19/2021 16:42   MM 3D SCREEN BREAST BILATERAL  Result Date: 11/11/2021 CLINICAL DATA:  Screening. EXAM: DIGITAL SCREENING BILATERAL MAMMOGRAM WITH TOMOSYNTHESIS AND CAD TECHNIQUE: Bilateral screening digital craniocaudal and mediolateral oblique  mammograms were obtained. Bilateral screening digital breast tomosynthesis was performed. The images were evaluated with computer-aided detection. COMPARISON:  Previous exam(s).  ACR Breast Density Category c: The breast tissue is heterogeneously dense, which may obscure small masses. FINDINGS: There are no findings suspicious for malignancy. IMPRESSION: No mammographic evidence of malignancy. A result letter of this screening mammogram will be mailed directly to the patient. RECOMMENDATION: Screening mammogram in one year. (Code:SM-B-01Y) BI-RADS CATEGORY  1: Negative. Electronically Signed   By: Dorise Bullion III M.D.   On: 11/11/2021 18:50      Carmell Austria, MD 11/27/2021, 9:24 AM  Cc: Mackie Pai, PA-C

## 2021-11-27 NOTE — Progress Notes (Signed)
C.W. vital signs. 

## 2021-11-27 NOTE — Op Note (Signed)
[place] Patient Name: Lauren Case Procedure Date: 11/27/2021 9:19 AM MRN: [id number] Endoscopist: Jackquline Denmark , MD Age: 56 Referring MD:  Date of Birth: April 13, 1966 Gender: Female Account #: [id number] Procedure:                Colonoscopy Indications:              Unexplained iron deficiency anemia Medicines:                Monitored Anesthesia Care Procedure:                Pre-Anesthesia Assessment:                           - Prior to the procedure, a History and Physical                            was performed, and patient medications and                            allergies were reviewed. The patient's tolerance of                            previous anesthesia was also reviewed. The risks                            and benefits of the procedure and the sedation                            options and risks were discussed with the patient.                            All questions were answered, and informed consent                            was obtained. Prior Anticoagulants: The patient has                            taken no previous anticoagulant or antiplatelet                            agents. ASA Grade Assessment: II - A patient with                            mild systemic disease. After reviewing the risks                            and benefits, the patient was deemed in                            satisfactory condition to undergo the procedure.                           After obtaining informed consent, the colonoscope  was passed under direct vision. Throughout the                            procedure, the patient's blood pressure, pulse, and                            oxygen saturations were monitored continuously. The                            Olympus CF-HQ190L (Serial# [id number]) Colonoscope was                            introduced through the anus and advanced to the 2                            cm into the ileum. The  colonoscopy was performed                            without difficulty. The patient tolerated the                            procedure well. The quality of the bowel                            preparation was adequate to identify polyps. The                            terminal ileum, ileocecal valve, appendiceal                            orifice, and rectum were photographed. Scope In: 9:50:18 AM Scope Out: 10:09:44 AM Scope Withdrawal Time: 0 hours 13 minutes 45 seconds  Total Procedure Duration: 0 hours 19 minutes 26 seconds  Findings:                 A 10 mm polyp was found in the distal ascending                            colon. The polyp was sessile. The polyp was removed                            with a hot snare. Resection and retrieval were                            complete.                           A 6 mm polyp was found in the mid transverse colon.                            The polyp was sessile. The polyp was removed with a                            cold  snare. Resection and retrieval were complete.                           A 30 mm polypoid lesion was found at the hepatic                            flexure. The lesion was submucosal and                            semi-pedunculated. No bleeding was present. This                            was biopsied with a cold forceps for histology.                            Deemed not safe for removal @ [facility].                           A 10 mm polypoid lesion was found in the mid                            ascending colon with positive pillow sign s/o                            lipoma. The lesion was submucosal and sessile. No                            bleeding was present.                           A few small-mouthed diverticula were found in the                            sigmoid colon.                           Non-bleeding internal hemorrhoids were found during                            retroflexion. The hemorrhoids were  small and Grade                            I (internal hemorrhoids that do not prolapse).                           The terminal ileum appeared normal.                           The exam was otherwise without abnormality on                            direct and retroflexion views. Complications:            No immediate complications. Estimated Blood Loss:     Estimated blood loss: none. Impression:               -  One 10 mm polyp in the distal ascending colon,                            removed with a hot snare. Resected and retrieved.                           - One 6 mm polyp in the mid transverse colon,                            removed with a cold snare. Resected and retrieved.                           - Submucosal lesion at the hepatic flexure.                            Biopsied.                           - Submucosal lesion in the mid ascending colon.                           - Mild sigmoid diverticulosis.                           - Non-bleeding internal hemorrhoids.                           - The examined portion of the ileum was normal.                           - The examination was otherwise normal on direct                            and retroflexion views. Recommendation:           - Patient has a contact number available for                            emergencies. The signs and symptoms of potential                            delayed complications were discussed with the                            patient. Return to normal activities tomorrow.                            Written discharge instructions were provided to the                            patient.                           - Resume previous diet.                           -  Continue present medications.                           - Await pathology results.                           - Repeat colonoscopy for surveillance based on                            pathology results.                           - The  findings and recommendations were discussed                            with the patient's family.                           - Return to GI clinic in 12 weeks. Jackquline Denmark, MD 11/27/2021 10:22:39 AM This report has been signed electronically.

## 2021-11-27 NOTE — Progress Notes (Signed)
Pt in recovery with monitors in place, VSS. Report given to receiving RN. Bite guard was placed with pt awake to ensure comfort. No dental or soft tissue damage noted. 

## 2021-11-27 NOTE — Op Note (Signed)
[place] Patient Name: Lauren Case Procedure Date: 11/27/2021 9:25 AM MRN: [id number] Endoscopist: Jackquline Denmark , MD Age: 56 Referring MD:  Date of Birth: 29-Nov-1965 Gender: Female Account #: [id number] Procedure:                Upper GI endoscopy Indications:              Iron deficiency anemia Medicines:                Monitored Anesthesia Care Procedure:                Pre-Anesthesia Assessment:                           - Prior to the procedure, a History and Physical                            was performed, and patient medications and                            allergies were reviewed. The patient's tolerance of                            previous anesthesia was also reviewed. The risks                            and benefits of the procedure and the sedation                            options and risks were discussed with the patient.                            All questions were answered, and informed consent                            was obtained. Prior Anticoagulants: The patient has                            taken no previous anticoagulant or antiplatelet                            agents. ASA Grade Assessment: II - A patient with                            mild systemic disease. After reviewing the risks                            and benefits, the patient was deemed in                            satisfactory condition to undergo the procedure.                           After obtaining informed consent, the endoscope was  passed under direct vision. Throughout the                            procedure, the patient's blood pressure, pulse, and                            oxygen saturations were monitored continuously. The                            Endoscope was introduced through the mouth, and                            advanced to the second part of duodenum. The upper                            GI endoscopy was accomplished  without difficulty.                            The patient tolerated the procedure well. Scope In: Scope Out: Findings:                 The examined esophagus was normal with well-defined                            Z-line at 36 cm. No varices.                           Diffuse moderate inflammation characterized by                            erythema was found in the stomach. Early portal                            hypertensive gastropathy. Moderate gastritis in                            antrum could represent early GAVE. No active                            bleeding. Multiple biopsies were taken with a cold                            forceps for histology.                           The examined duodenum was normal. Biopsies for                            histology were taken with a cold forceps for                            evaluation of celiac disease. Complications:            No immediate complications. Estimated Blood Loss:     Estimated blood loss: none. Impression:               -  Moderate gastritis                           -Mild portal hypertensive gastropathy. Recommendation:           - Patient has a contact number available for                            emergencies. The signs and symptoms of potential                            delayed complications were discussed with the                            patient. Return to normal activities tomorrow.                            Written discharge instructions were provided to the                            patient.                           - Resume previous diet.                           - Follow biopsies.                           - Continue present medications.                           - No aspirin, ibuprofen, BCs, naproxen, or other                            non-steroidal anti-inflammatory drugs.                           - Stop drinking alcohol.                           - Check CBC, CMP today.                            - Change omeprazole to Protonix 40 mg p.o. QD #90,                            4RF.                           - Would need CT Abdo/pelvis with p.o. and IV                            contrast as outpatient                           - Proceed with colonoscopy.                           -  The findings and recommendations were discussed                            with the patient's family. Jackquline Denmark, MD 11/27/2021 10:14:57 AM This report has been signed electronically.

## 2021-11-28 ENCOUNTER — Other Ambulatory Visit: Payer: Self-pay

## 2021-11-28 ENCOUNTER — Telehealth: Payer: Self-pay

## 2021-11-28 DIAGNOSIS — D509 Iron deficiency anemia, unspecified: Secondary | ICD-10-CM

## 2021-11-28 NOTE — Telephone Encounter (Signed)
Per Upper GI Endoscopy recommendations: Pt was scheduled for a CT Abdomen/Pelvis with PO and IV contrast on 12/04/2021 at 4:30 at [facility]: Pt to arrive at 4:15; Pt to be NPO 4 hours prior. Pt to pick up Oral Prep before date of CT: Left message for pt to call back.

## 2021-11-28 NOTE — Telephone Encounter (Signed)
Patient returned call

## 2021-11-28 NOTE — Telephone Encounter (Signed)
Left message for pt to call back  °

## 2021-11-28 NOTE — Telephone Encounter (Signed)
Per Upper GI Endoscopy recommendations: Pt was scheduled for a CT Abdomen/Pelvis with PO and IV contrast on 12/04/2021 at 4:30 at [facility]: Pt to arrive at 4:15; Pt to be NPO 4 hours prior. Pt to pick up Oral Prep before date of CT: Pt states that she has the Prep already( Pt states the office gave to her yesterday)  Pt stated that she may have to reschedule: Number given to radiology to reschedule if needed: [phone] Pt verbalized understanding with all questions answered.

## 2021-11-29 ENCOUNTER — Telehealth: Payer: Self-pay | Admitting: *Deleted

## 2021-11-29 NOTE — Telephone Encounter (Signed)
°  Follow up Call-  Call back number 11/27/2021  Post procedure Call Back phone  # [phone]  Permission to leave phone message Yes  Some recent data might be hidden     Patient questions:  Do you have a fever, pain , or abdominal swelling? No. Pain Score  0 *  Have you tolerated food without any problems? Yes.    Have you been able to return to your normal activities? Yes.    Do you have any questions about your discharge instructions: Diet   No. Medications  No. Follow up visit  No.  Do you have questions or concerns about your Care? No.  Actions: * If pain score is 4 or above: No action needed, pain <4.  Have you developed a fever since your procedure? no  2.   Have you had an respiratory symptoms (SOB or cough) since your procedure? no  3.   Have you tested positive for COVID 19 since your procedure no  4.   Have you had any family members/close contacts diagnosed with the COVID 19 since your procedure?  no   If yes to any of these questions please route to Joylene John, RN and Joella Prince, RN

## 2021-11-29 NOTE — Telephone Encounter (Signed)
First attempt, left VM.  

## 2021-12-01 NOTE — Progress Notes (Signed)
Please inform the patient. AST to ALT ratio c/w ETOH.  Have to rule out other causes.  Check acute viral hepatitis, autoimmune hepatitis panel (AMA, ASMA), ANA, SPEP, iron studies, serum ceruloplasmin, A1AT, celiac screen and GGT. -Check alpha-fetoprotein and PT INR. -Check anti-HAV total Ab and HBsAb.  If negative, would recommend vaccination for hepatitis A and B. -Stop drinking all alcohol -Repeat LFTs in 6 weeks

## 2021-12-03 ENCOUNTER — Other Ambulatory Visit: Payer: Self-pay

## 2021-12-03 DIAGNOSIS — D509 Iron deficiency anemia, unspecified: Secondary | ICD-10-CM

## 2021-12-03 DIAGNOSIS — K760 Fatty (change of) liver, not elsewhere classified: Secondary | ICD-10-CM

## 2021-12-03 DIAGNOSIS — R7989 Other specified abnormal findings of blood chemistry: Secondary | ICD-10-CM

## 2021-12-04 ENCOUNTER — Ambulatory Visit (HOSPITAL_COMMUNITY): Payer: 59

## 2021-12-06 ENCOUNTER — Encounter: Payer: Self-pay | Admitting: Gastroenterology

## 2021-12-06 NOTE — Telephone Encounter (Signed)
Message sent from pt my chart: Pt was given number to Radiology to reschedule.  Please see note below and advise:  Message -----      From:Lauren Case      Sent:12/06/2021  2:39 PM EST        VD:[id number] Lyndel Safe, MD   Subject:CT of Abdomen  Hi Dr Lyndel Safe, Happy Friday!! Is there any way that this CT scan can be done at the Radiology dept at [facility] on [place]? Also, I never got the pathology report for the polyps. When should I get the results? Yesterday was my first day with no alcohol and I made it!! A day at a time!! Thank you for your help!!  Please advise on Pathology for the polyps:

## 2021-12-08 ENCOUNTER — Telehealth: Payer: Self-pay | Admitting: Gastroenterology

## 2021-12-08 NOTE — Telephone Encounter (Signed)
Let me know when CT Abdo/pelvis is done. RG

## 2021-12-09 NOTE — Telephone Encounter (Signed)
Reminder placed in Epic to follow up:

## 2021-12-10 NOTE — Telephone Encounter (Signed)
I do not see CT in the chart. Bx-sessile serrated polyps Recommend repeat colonoscopy in 3 years.  She will shortly get a letter RG

## 2021-12-14 ENCOUNTER — Encounter: Payer: Self-pay | Admitting: Gastroenterology

## 2021-12-17 ENCOUNTER — Telehealth: Payer: Self-pay

## 2021-12-17 NOTE — Telephone Encounter (Signed)
Pt called to follow up on CT scan: Left message for pt to call back

## 2021-12-17 NOTE — Telephone Encounter (Signed)
Pt called and reminded of blood work that needs to be drawn and Ct scan that she needs to call and reschedule: Pt stated that she was going to get Labs Done today and schedule CT scan. Pt stated that she is 2 weeks with NO alcohol. Pt congratulated  Pt verbalized understanding with all questions answered.

## 2021-12-25 ENCOUNTER — Other Ambulatory Visit (INDEPENDENT_AMBULATORY_CARE_PROVIDER_SITE_OTHER): Payer: 59

## 2021-12-25 DIAGNOSIS — R7989 Other specified abnormal findings of blood chemistry: Secondary | ICD-10-CM | POA: Diagnosis not present

## 2021-12-25 DIAGNOSIS — D509 Iron deficiency anemia, unspecified: Secondary | ICD-10-CM | POA: Diagnosis not present

## 2021-12-25 DIAGNOSIS — K760 Fatty (change of) liver, not elsewhere classified: Secondary | ICD-10-CM | POA: Diagnosis not present

## 2021-12-25 LAB — IBC + FERRITIN
Ferritin: 52 ng/mL (ref 10.0–291.0)
Iron: 151 ug/dL — ABNORMAL HIGH (ref 42–145)
Saturation Ratios: 36.9 % (ref 20.0–50.0)
TIBC: 408.8 ug/dL (ref 250.0–450.0)
Transferrin: 292 mg/dL (ref 212.0–360.0)

## 2021-12-25 LAB — HEPATIC FUNCTION PANEL
ALT: 64 U/L — ABNORMAL HIGH (ref 0–35)
AST: 82 U/L — ABNORMAL HIGH (ref 0–37)
Albumin: 4.4 g/dL (ref 3.5–5.2)
Alkaline Phosphatase: 108 U/L (ref 39–117)
Bilirubin, Direct: 0.1 mg/dL (ref 0.0–0.3)
Total Bilirubin: 0.5 mg/dL (ref 0.2–1.2)
Total Protein: 7.4 g/dL (ref 6.0–8.3)

## 2021-12-25 LAB — GAMMA GT: GGT: 116 U/L — ABNORMAL HIGH (ref 7–51)

## 2021-12-25 LAB — IRON: Iron: 151 ug/dL — ABNORMAL HIGH (ref 42–145)

## 2021-12-25 LAB — PROTIME-INR
INR: 1.2 ratio — ABNORMAL HIGH (ref 0.8–1.0)
Prothrombin Time: 12.9 s (ref 9.6–13.1)

## 2021-12-26 LAB — ALPHA-1-ANTITRYPSIN: A-1 Antitrypsin, Ser: 239 mg/dL — ABNORMAL HIGH (ref 83–199)

## 2021-12-29 LAB — PROTEIN ELECTROPHORESIS, SERUM
Albumin ELP: 4.1 g/dL (ref 3.8–4.8)
Alpha 1: 0.4 g/dL — ABNORMAL HIGH (ref 0.2–0.3)
Alpha 2: 1.1 g/dL — ABNORMAL HIGH (ref 0.5–0.9)
Beta 2: 0.5 g/dL (ref 0.2–0.5)
Beta Globulin: 0.5 g/dL (ref 0.4–0.6)
Gamma Globulin: 0.9 g/dL (ref 0.8–1.7)
Total Protein: 7.6 g/dL (ref 6.1–8.1)

## 2021-12-29 LAB — TISSUE TRANSGLUTAMINASE ABS,IGG,IGA
(tTG) Ab, IgA: <1 U/mL
(tTG) Ab, IgG: <1 U/mL

## 2021-12-29 LAB — HEPATITIS B CORE ANTIBODY, IGM: Hep B C IgM: NONREACTIVE

## 2021-12-29 LAB — CERULOPLASMIN: Ceruloplasmin: 31 mg/dL (ref 18–53)

## 2021-12-29 LAB — HEPATITIS C ANTIBODY
Hepatitis C Ab: NONREACTIVE
SIGNAL TO CUT-OFF: <0.02 (ref <1.00)

## 2021-12-29 LAB — ALPHA-1-ANTITRYPSIN: A-1 Antitrypsin, Ser: 237 mg/dL — ABNORMAL HIGH (ref 83–199)

## 2021-12-29 LAB — HEPATITIS A ANTIBODY, TOTAL: Hepatitis A AB,Total: REACTIVE — AB

## 2021-12-29 LAB — ANTI-SMOOTH MUSCLE ANTIBODY, IGG: Actin (Smooth Muscle) Antibody (IGG): <20 U (ref <20)

## 2021-12-29 LAB — IGG: IgG (Immunoglobin G), Serum: 874 mg/dL (ref 600–1640)

## 2021-12-29 LAB — AFP TUMOR MARKER: AFP-Tumor Marker: 3.5 ng/mL

## 2021-12-29 LAB — HEPATITIS B SURFACE ANTIBODY,QUALITATIVE: Hep B S Ab: NONREACTIVE

## 2021-12-29 LAB — HEPATITIS B SURFACE ANTIGEN: Hepatitis B Surface Ag: NONREACTIVE

## 2021-12-29 LAB — ANA: Anti Nuclear Antibody (ANA): NEGATIVE

## 2021-12-29 LAB — MITOCHONDRIAL ANTIBODIES: Mitochondrial M2 Ab, IgG: <=20 U (ref <=20.0)

## 2021-12-29 LAB — HEPATITIS B CORE ANTIBODY, TOTAL: Hep B Core Total Ab: NONREACTIVE

## 2021-12-30 ENCOUNTER — Other Ambulatory Visit: Payer: Self-pay | Admitting: Medical

## 2022-01-01 ENCOUNTER — Other Ambulatory Visit: Payer: Self-pay

## 2022-01-01 DIAGNOSIS — K760 Fatty (change of) liver, not elsewhere classified: Secondary | ICD-10-CM

## 2022-01-01 DIAGNOSIS — R7989 Other specified abnormal findings of blood chemistry: Secondary | ICD-10-CM

## 2022-01-02 ENCOUNTER — Telehealth: Payer: Self-pay

## 2022-01-02 NOTE — Telephone Encounter (Signed)
Pt scheduled for 12 week follow up for 02/21/2022 at 9:10 with Dr. Lyndel Safe in [facility]: Pt made aware ?Pt verbalized understanding with all questions answered.  ?

## 2022-01-02 NOTE — Telephone Encounter (Signed)
-----   Message from Gillermina Hu, RN sent at 11/28/2021 12:19 PM EST ----- ?Regarding: Follow Up 12 weeks Dr. Lyndel Safe ?Colonoscopy Recommendation:  ?Follow Up 12 weeks: April 26/2023 ? ?

## 2022-01-07 LAB — CELIAC AB TTG DGP TIGA
Antigliadin Abs, IgA: 4 units (ref 0–19)
Gliadin IgG: 2 units (ref 0–19)
IgA/Immunoglobulin A, Serum: 311 mg/dL (ref 87–352)
Tissue Transglut Ab: <2 U/mL (ref 0–5)
Transglutaminase IgA: <2 U/mL (ref 0–3)

## 2022-01-07 LAB — CELIAC HLA RFLX TO ABS
DQ2 (DQA1 0501/0505,DQB1 02XX): NEGATIVE
DQ8 (DQA1 03XX, DQB1 0302): POSITIVE

## 2022-01-10 ENCOUNTER — Telehealth: Payer: Self-pay

## 2022-01-10 NOTE — Telephone Encounter (Signed)
Pt recently came to office for Heplisav B first injection: Unfortuanlty we were out of them and the pt was not able to receive the injection. Pt requested to have her first injection on her Office visit that she already has scheduled: ?02/21/2022 at 9:10: Second one scheduled for 03/24/2022 at 4:00: Pt made aware: Pt verbalized understanding with all questions answered.  ?

## 2022-01-13 ENCOUNTER — Other Ambulatory Visit: Payer: Self-pay | Admitting: Medical

## 2022-01-14 ENCOUNTER — Telehealth: Payer: Self-pay

## 2022-01-14 NOTE — Telephone Encounter (Signed)
Pt was contacted after multiple My Chart Messages sent: Pt was notified of her appointments that are scheduled: Pt verbalized understanding with all questions answered.  ? ? ?

## 2022-01-28 ENCOUNTER — Other Ambulatory Visit: Payer: Self-pay | Admitting: Medical

## 2022-02-03 ENCOUNTER — Encounter: Payer: Self-pay | Admitting: Medical

## 2022-02-05 ENCOUNTER — Ambulatory Visit: Payer: 59 | Admitting: Medical

## 2022-02-05 VITALS — BP 148/100 | HR 80 | Temp 98.5°F | Resp 18 | Ht 64.0 in | Wt 194.0 lb

## 2022-02-05 DIAGNOSIS — F419 Anxiety disorder, unspecified: Secondary | ICD-10-CM | POA: Diagnosis not present

## 2022-02-05 DIAGNOSIS — D72829 Elevated white blood cell count, unspecified: Secondary | ICD-10-CM

## 2022-02-05 DIAGNOSIS — F17200 Nicotine dependence, unspecified, uncomplicated: Secondary | ICD-10-CM

## 2022-02-05 DIAGNOSIS — F329 Major depressive disorder, single episode, unspecified: Secondary | ICD-10-CM | POA: Diagnosis not present

## 2022-02-05 DIAGNOSIS — E538 Deficiency of other specified B group vitamins: Secondary | ICD-10-CM

## 2022-02-05 DIAGNOSIS — R5383 Other fatigue: Secondary | ICD-10-CM | POA: Diagnosis not present

## 2022-02-05 MED ORDER — BUSPIRONE HCL 7.5 MG PO TABS: 7.5000 mg | ORAL_TABLET | Freq: Two times a day (BID) | ORAL | 0 refills | Status: DC

## 2022-02-05 NOTE — Patient Instructions (Addendum)
Depression with phq 9 score 11. Rx continue wellbutrin and prozac same doses.  ? ?Anxiety mild with gad 7 score of 4. Will add buspar low dose. Will see if controlls mild anxiety and maybe secondary benefit on mood. ? ?Reluctant to add ssri type med or increase dose due to serotonin syndrome risk.  ? ?For fatigue will get cbc, iron, tsh, t4, b12, b1 and iron level. ? ?Follow up in 2 weeks or sooner if needed. ? ? ? ?

## 2022-02-05 NOTE — Progress Notes (Signed)
? ?Subjective:  ? ? Patient ID: Lauren Case, female    DOB: 1966/08/10, 56 y.o.   MRN: [id number] ? ?HPI ? ?Pt in for depression. Pt states came about after her son moved out of town and she recently moved in with her dad. Also had to give 2 dogs away. 38 yo dog and 64 yo dog. ? ?Pt also stopped drinking as these changes were taking place. She was drinking 2.5 gallons of liquor a week. Now none at all. ? ?Pt is on 60 mg of prozac in past.  Pt in past had used paxil although she felt great while on paxil. ? ?Pt states she is bored and fatigues since drinking. ? ? ?Review of Systems  ?Constitutional:  Negative for chills, fatigue and fever.  ?Respiratory:  Negative for cough, chest tightness, shortness of breath and wheezing.   ?Cardiovascular:  Negative for chest pain and palpitations.  ?Gastrointestinal:  Negative for abdominal pain.  ?Genitourinary:  Negative for dyspareunia, flank pain, frequency and hematuria.  ?Musculoskeletal:  Negative for back pain, myalgias and neck stiffness.  ?Skin:  Negative for rash.  ?Psychiatric/Behavioral:  Positive for dysphoric mood. Negative for agitation, decreased concentration, sleep disturbance and suicidal ideas. The patient is nervous/anxious.   ? ? ?Past Medical History:  ?Diagnosis Date  ? Chicken pox   ? Environmental allergies   ? GERD (gastroesophageal reflux disease)   ? Hypertension   ? Measles   ? Mumps   ? Seasonal allergies   ? Vaginal Pap smear, abnormal   ? ?  ?Social History  ? ?Socioeconomic History  ? Marital status: Single  ?  Spouse name: Not on file  ? Number of children: 1  ? Years of education: Not on file  ? Highest education level: Not on file  ?Occupational History  ? Occupation: Operations Coodinator  ?Tobacco Use  ? Smoking status: Every Day  ?  Packs/day: 1.00  ?  Years: 30.00  ?  Pack years: 30.00  ?  Types: Cigarettes  ? Smokeless tobacco: Never  ?Vaping Use  ? Vaping Use: Never used  ?Substance and Sexual Activity  ? Alcohol use: Yes  ?   Alcohol/week: 10.0 standard drinks  ?  Types: 10 Shots of liquor per week  ?  Comment: daily  ? Drug use: No  ? Sexual activity: Never  ?  Birth control/protection: Surgical  ?Other Topics Concern  ? Not on file  ?Social History Narrative  ? Not on file  ? ?Social Determinants of Health  ? ?Financial Resource Strain: Not on file  ?Food Insecurity: Not on file  ?Transportation Needs: Not on file  ?Physical Activity: Not on file  ?Stress: Not on file  ?Social Connections: Not on file  ?Intimate Partner Violence: Not on file  ? ? ?Past Surgical History:  ?Procedure Laterality Date  ? ESSURE TUBAL LIGATION    ? TONSILLECTOMY  1984  ? WISDOM TOOTH EXTRACTION    ? ? ?Family History  ?Problem Relation Age of Onset  ? Healthy Mother   ?     Living  ? Healthy Father   ?     Living  ? Healthy Brother   ?     x1  ? Alzheimer's disease Maternal Grandmother   ? Diabetes Maternal Grandfather   ?     Borderline  ? Alzheimer's disease Paternal Grandfather   ? Allergies Son   ?     x1  ? COPD Maternal Aunt   ?  COPD Maternal Uncle   ? Colon cancer Neg Hx   ? Rectal cancer Neg Hx   ? Stomach cancer Neg Hx   ? Esophageal cancer Neg Hx   ? ? ?Allergies  ?Allergen Reactions  ? Other Anaphylaxis  ?  Truffle Oils  ? Penicillins Anaphylaxis  ? ? ?Current Outpatient Medications on File Prior to Visit  ?Medication Sig Dispense Refill  ? albuterol (PROVENTIL HFA;VENTOLIN HFA) 108 (90 Base) MCG/ACT inhaler Inhale 2 puffs into the lungs every 6 (six) hours as needed for wheezing or shortness of breath. 1 Inhaler 2  ? amLODipine (NORVASC) 10 MG tablet TAKE 1 TABLET BY MOUTH EVERY DAY 90 tablet 0  ? buPROPion (WELLBUTRIN XL) 150 MG 24 hr tablet TAKE 1 TABLET BY MOUTH EVERY DAY 30 tablet 5  ? cetirizine (ZYRTEC) 10 MG tablet Take 10 mg by mouth daily.    ? clonazePAM (KLONOPIN) 0.5 MG tablet Take 1 tablet (0.5 mg total) by mouth 2 (two) times daily as needed for anxiety. 14 tablet 0  ? ezetimibe (ZETIA) 10 MG tablet Take 1 tablet (10 mg total) by  mouth daily. 30 tablet 3  ? FEROSUL 325 (65 Fe) MG tablet TAKE 1 TABLET BY MOUTH 3 TIMES DAILY (Patient taking differently: Take 1 time daily) 90 tablet 1  ? FLUoxetine (PROZAC) 20 MG capsule TAKE 3 CAPSULES BY MOUTH DAILY 90 capsule 0  ? hydrOXYzine (ATARAX/VISTARIL) 10 MG tablet TAKE ONE TABLET BY MOUTH THREE TIMES DAILY AS NEEDED FOR ITCHING 30 tablet 0  ? levocetirizine (XYZAL) 5 MG tablet Take 1 tablet (5 mg total) by mouth every evening. 30 tablet 3  ? losartan (COZAAR) 100 MG tablet TAKE 1 TABLET BY MOUTH EVERY DAY 90 tablet 0  ? nicotine (NICODERM CQ - DOSED IN MG/24 HR) 7 mg/24hr patch Place 1 patch (7 mg total) onto the skin daily. 28 patch 0  ? pantoprazole (PROTONIX) 40 MG tablet Take 1 tablet (40 mg total) by mouth daily. 90 tablet 4  ? rosuvastatin (CRESTOR) 10 MG tablet TAKE 1 TABLET BY MOUTH EVERY DAY FOR CHOLESTEROL 30 tablet 3  ? traZODone (DESYREL) 50 MG tablet Take 0.5 tablets (25 mg total) by mouth at bedtime as needed for sleep. 30 tablet 1  ? ?No current facility-administered medications on file prior to visit.  ? ? ?BP (!) 148/100   Pulse 80   Temp 98.5 ?F (36.9 ?C)   Resp 18   Ht '5\' 4"'$  (1.626 m)   Wt 194 lb (88 kg)   SpO2 95%   BMI 33.30 kg/m?  ?  ?   ?Objective:  ? Physical Exam ? ? ?General ?Mental Status- Alert. General Appearance- Not in acute distress.  ? ?Skin ?General: Color- Normal Color. Moisture- Normal Moisture. ? ?Neck ?Carotid Arteries- Normal color. Moisture- Normal Moisture. No carotid bruits. No JVD. ? ?Chest and Lung Exam ?Auscultation: ?Breath Sounds:-Normal. ? ?Cardiovascular ?Auscultation:Rythm- Regular. ?Murmurs & Other Heart Sounds:Auscultation of the heart reveals- No Murmurs. ? ?Neurologic ?Cranial Nerve exam:- CN III-XII intact(No nystagmus), symmetric smile. ?Strength:- 5/5 equal and symmetric strength both upper and lower extremities.  ? ?   ?Assessment & Plan:  ? ?Patient Instructions  ?Depression with phq 9 score 11. Rx continue wellbutrin and prozac same  doses.  ? ?Anxiety mild with gad 7 score of 4. Will add buspar low dose. Will see if controlls mild anxiety and maybe secondary benefit on mood. ? ?Reluctant to add ssri type med or increase dose due to  serotonin syndrome risk.  ? ?For fatigue will get cbc, iron, tsh, t4, b12, b1 and iron level. ? ?Follow up in 2 weeks or sooner if needed. ?  ?Mackie Pai, PA-C  ?

## 2022-02-06 LAB — COMPREHENSIVE METABOLIC PANEL
ALT: 28 U/L (ref 0–35)
AST: 35 U/L (ref 0–37)
Albumin: 4.3 g/dL (ref 3.5–5.2)
Alkaline Phosphatase: 111 U/L (ref 39–117)
BUN: 12 mg/dL (ref 6–23)
CO2: 26 mEq/L (ref 19–32)
Calcium: 9.6 mg/dL (ref 8.4–10.5)
Chloride: 101 mEq/L (ref 96–112)
Creatinine, Ser: 0.76 mg/dL (ref 0.40–1.20)
GFR: 86.47 mL/min (ref >60.00)
Glucose, Bld: 121 mg/dL — ABNORMAL HIGH (ref 70–99)
Potassium: 3.3 mEq/L — ABNORMAL LOW (ref 3.5–5.1)
Sodium: 140 mEq/L (ref 135–145)
Total Bilirubin: 0.4 mg/dL (ref 0.2–1.2)
Total Protein: 6.8 g/dL (ref 6.0–8.3)

## 2022-02-06 LAB — CBC WITH DIFFERENTIAL/PLATELET
Basophils Absolute: 0.1 10*3/uL (ref 0.0–0.1)
Basophils Relative: 1 % (ref 0.0–3.0)
Eosinophils Absolute: 0.1 10*3/uL (ref 0.0–0.7)
Eosinophils Relative: 1 % (ref 0.0–5.0)
HCT: 45.9 % (ref 36.0–46.0)
Hemoglobin: 15 g/dL (ref 12.0–15.0)
Lymphocytes Relative: 21.7 % (ref 12.0–46.0)
Lymphs Abs: 2.9 10*3/uL (ref 0.7–4.0)
MCHC: 32.8 g/dL (ref 30.0–36.0)
MCV: 91.5 fl (ref 78.0–100.0)
Monocytes Absolute: 0.8 10*3/uL (ref 0.1–1.0)
Monocytes Relative: 5.6 % (ref 3.0–12.0)
Neutro Abs: 9.6 10*3/uL — ABNORMAL HIGH (ref 1.4–7.7)
Neutrophils Relative %: 70.7 % (ref 43.0–77.0)
Platelets: 196 10*3/uL (ref 150.0–400.0)
RBC: 5.02 Mil/uL (ref 3.87–5.11)
RDW: 14.1 % (ref 11.5–15.5)
WBC: 13.6 10*3/uL — ABNORMAL HIGH (ref 4.0–10.5)

## 2022-02-06 LAB — TSH: TSH: 0.93 u[IU]/mL (ref 0.35–5.50)

## 2022-02-06 LAB — IRON: Iron: 101 ug/dL (ref 42–145)

## 2022-02-06 LAB — T4, FREE: Free T4: 0.72 ng/dL (ref 0.60–1.60)

## 2022-02-06 LAB — VITAMIN B12: Vitamin B-12: 591 pg/mL (ref 211–911)

## 2022-02-07 MED ORDER — POTASSIUM CHLORIDE CRYS ER 10 MEQ PO TBCR: 10.0000 meq | EXTENDED_RELEASE_TABLET | Freq: Every day | ORAL | 0 refills | Status: DC

## 2022-02-07 NOTE — Addendum Note (Signed)
Addended by: Anabel Halon on: 02/07/2022 02:03 PM ? ? Modules accepted: Orders ? ?

## 2022-02-10 ENCOUNTER — Telehealth: Payer: Self-pay

## 2022-02-10 ENCOUNTER — Ambulatory Visit (HOSPITAL_BASED_OUTPATIENT_CLINIC_OR_DEPARTMENT_OTHER)
Admission: RE | Admit: 2022-02-10 | Discharge: 2022-02-10 | Disposition: A | Discharge status: Home / Self Care | Payer: 59 | Source: Ambulatory Visit | Attending: Medical | Admitting: Medical

## 2022-02-10 DIAGNOSIS — R5383 Other fatigue: Secondary | ICD-10-CM | POA: Diagnosis present

## 2022-02-10 DIAGNOSIS — D72829 Elevated white blood cell count, unspecified: Secondary | ICD-10-CM | POA: Diagnosis present

## 2022-02-10 DIAGNOSIS — F17200 Nicotine dependence, unspecified, uncomplicated: Secondary | ICD-10-CM | POA: Diagnosis present

## 2022-02-10 NOTE — Telephone Encounter (Signed)
Patient Name: MINAH AXELROD ?Gender: Female ?DOB: [birth year] ?Age: 56 Y 3 M 7 D ?Return Phone Number: [phone] ?[facility] - Client ?Client Site [facility] - Night ?Provider Saguier, Percell Miller - PA ?Contact Type Call ?Who Is Calling Patient / Member / Family / Caregiver ?Call Type Triage / Clinical ?Relationship To Patient Self ?Return Phone Number [phone] (Primary) ?Chief Complaint Lab Result ?Reason for Call Symptomatic / Request for Health Information ?Initial Comment Caller saw the dr Wednesday in which he had ?blood work. He advised that the pt white blood ?cells were high and suggested that the pt had ?walking pneumonia. Pt wants to discuss it being ?contagious ?Translation No ?Nurse Assessment ?Nurse: De Burrs, RN, Oneita Kras Date/Time Eilene Ghazi Time): 02/09/2022 9:34:57 PM ?Confirm and document reason for call. If ?symptomatic, describe symptoms. ?---Caller saw the dr Wednesday in which he had ?blood work. States he advised that the pt white ?blood cells were high and suggested that the pt had ?walking pneumonia. Pt wants to discuss it being ?contagious. Caller states that she is due to get a chest ?xray tomorrow to confirm diagnosis. Caller states that ?she has a bad cough and was diagnosed at an [facility] two ?weeks ago. ?

## 2022-02-10 NOTE — Telephone Encounter (Signed)
Will call pt after Lauren Case reviews her xray  ?

## 2022-02-11 ENCOUNTER — Other Ambulatory Visit: Payer: Self-pay | Admitting: Medical

## 2022-02-13 LAB — VITAMIN B1: Vitamin B1 (Thiamine): 10 nmol/L (ref 8–30)

## 2022-02-21 ENCOUNTER — Ambulatory Visit (INDEPENDENT_AMBULATORY_CARE_PROVIDER_SITE_OTHER): Payer: 59 | Admitting: Gastroenterology

## 2022-02-21 ENCOUNTER — Telehealth: Payer: Self-pay

## 2022-02-21 ENCOUNTER — Ambulatory Visit: Payer: 59 | Admitting: Gastroenterology

## 2022-02-21 DIAGNOSIS — R7989 Other specified abnormal findings of blood chemistry: Secondary | ICD-10-CM

## 2022-02-21 DIAGNOSIS — Z23 Encounter for immunization: Secondary | ICD-10-CM

## 2022-02-21 DIAGNOSIS — K760 Fatty (change of) liver, not elsewhere classified: Secondary | ICD-10-CM | POA: Diagnosis not present

## 2022-02-21 NOTE — Telephone Encounter (Signed)
Patient said that she is doing well. She does take a BC every once in a while although you told her not to but she takes it for her headaches and wonders if you have any recommendations for what she can take since she isn't suppose to take anything over the counter. Her liver enzymes are back to normal and not drinking ?

## 2022-02-21 NOTE — Telephone Encounter (Signed)
No problems ?Use nonsteroidals only if absolutely needed ?Otherwise can use Tylenol on as needed basis (max of '500mg'$ , 4/day) ?No alcohol with Tylenol for sure ? ?RG ?

## 2022-02-21 NOTE — Progress Notes (Signed)
patient given hep B injection in left deltoid. Patient waited 15 minutes post injection period. Tolerated injection well and left the clinic ambulatory  ?

## 2022-02-24 NOTE — Telephone Encounter (Signed)
Patient made aware.

## 2022-03-04 ENCOUNTER — Other Ambulatory Visit: Payer: Self-pay | Admitting: Medical

## 2022-03-16 ENCOUNTER — Encounter: Payer: Self-pay | Admitting: Medical

## 2022-03-24 ENCOUNTER — Telehealth: Payer: Self-pay | Admitting: Gastroenterology

## 2022-03-24 NOTE — Telephone Encounter (Signed)
Patient called to cancel her appointment today for B12 injection. Per patient, doesn't feel like driving to [facility] so patient will go to PCP to have it done.

## 2022-03-25 NOTE — Telephone Encounter (Signed)
Noted  

## 2022-03-28 ENCOUNTER — Other Ambulatory Visit: Payer: Self-pay | Admitting: Medical

## 2022-04-09 ENCOUNTER — Ambulatory Visit: Payer: 59

## 2022-04-11 ENCOUNTER — Ambulatory Visit (INDEPENDENT_AMBULATORY_CARE_PROVIDER_SITE_OTHER): Payer: 59

## 2022-04-11 DIAGNOSIS — Z23 Encounter for immunization: Secondary | ICD-10-CM

## 2022-04-11 NOTE — Progress Notes (Signed)
patient given hep B injection in left deltoid. Patient waited 15 minutes post injection period.  Per provider verbal order Tolerated injection well in left deltoid

## 2022-04-15 ENCOUNTER — Other Ambulatory Visit: Payer: Self-pay | Admitting: Medical

## 2022-04-28 ENCOUNTER — Other Ambulatory Visit: Payer: Self-pay | Admitting: Medical

## 2022-04-29 ENCOUNTER — Other Ambulatory Visit: Payer: Self-pay | Admitting: Medical

## 2022-05-13 ENCOUNTER — Other Ambulatory Visit: Payer: Self-pay | Admitting: Medical

## 2022-05-29 ENCOUNTER — Encounter: Payer: Self-pay | Admitting: Gastroenterology

## 2022-06-03 ENCOUNTER — Other Ambulatory Visit: Payer: Self-pay | Admitting: Medical

## 2022-06-10 ENCOUNTER — Other Ambulatory Visit: Payer: Self-pay | Admitting: Medical

## 2022-06-19 ENCOUNTER — Other Ambulatory Visit: Payer: Self-pay | Admitting: Medical

## 2022-08-11 ENCOUNTER — Other Ambulatory Visit: Payer: Self-pay | Admitting: Medical

## 2022-08-20 ENCOUNTER — Other Ambulatory Visit: Payer: Self-pay | Admitting: Medical

## 2022-09-08 ENCOUNTER — Other Ambulatory Visit: Payer: Self-pay | Admitting: Medical

## 2022-09-18 ENCOUNTER — Ambulatory Visit (INDEPENDENT_AMBULATORY_CARE_PROVIDER_SITE_OTHER): Payer: 59 | Admitting: Medical

## 2022-09-18 VITALS — BP 122/77 | HR 72 | Temp 98.2°F | Resp 18 | Ht 64.0 in | Wt 195.0 lb

## 2022-09-18 DIAGNOSIS — Z23 Encounter for immunization: Secondary | ICD-10-CM | POA: Diagnosis not present

## 2022-09-18 DIAGNOSIS — Z124 Encounter for screening for malignant neoplasm of cervix: Secondary | ICD-10-CM

## 2022-09-18 DIAGNOSIS — E876 Hypokalemia: Secondary | ICD-10-CM

## 2022-09-18 DIAGNOSIS — Z0000 Encounter for general adult medical examination without abnormal findings: Secondary | ICD-10-CM | POA: Diagnosis not present

## 2022-09-18 DIAGNOSIS — F17200 Nicotine dependence, unspecified, uncomplicated: Secondary | ICD-10-CM

## 2022-09-18 LAB — CBC WITH DIFFERENTIAL/PLATELET
Basophils Absolute: 0 10*3/uL (ref 0.0–0.1)
Basophils Relative: 0.4 % (ref 0.0–3.0)
Eosinophils Absolute: 0 10*3/uL (ref 0.0–0.7)
Eosinophils Relative: 0.5 % (ref 0.0–5.0)
HCT: 44.8 % (ref 36.0–46.0)
Hemoglobin: 14.9 g/dL (ref 12.0–15.0)
Lymphocytes Relative: 19 % (ref 12.0–46.0)
Lymphs Abs: 1.6 10*3/uL (ref 0.7–4.0)
MCHC: 33.3 g/dL (ref 30.0–36.0)
MCV: 94.2 fl (ref 78.0–100.0)
Monocytes Absolute: 0.6 10*3/uL (ref 0.1–1.0)
Monocytes Relative: 6.7 % (ref 3.0–12.0)
Neutro Abs: 6.1 10*3/uL (ref 1.4–7.7)
Neutrophils Relative %: 73.4 % (ref 43.0–77.0)
Platelets: 164 10*3/uL (ref 150.0–400.0)
RBC: 4.75 Mil/uL (ref 3.87–5.11)
RDW: 15.5 % (ref 11.5–15.5)
WBC: 8.3 10*3/uL (ref 4.0–10.5)

## 2022-09-18 LAB — COMPREHENSIVE METABOLIC PANEL
ALT: 41 U/L — ABNORMAL HIGH (ref 0–35)
AST: 100 U/L — ABNORMAL HIGH (ref 0–37)
Albumin: 4.2 g/dL (ref 3.5–5.2)
Alkaline Phosphatase: 93 U/L (ref 39–117)
BUN: 6 mg/dL (ref 6–23)
CO2: 30 mEq/L (ref 19–32)
Calcium: 8.8 mg/dL (ref 8.4–10.5)
Chloride: 97 mEq/L (ref 96–112)
Creatinine, Ser: 0.7 mg/dL (ref 0.40–1.20)
GFR: 95.03 mL/min (ref >60.00)
Glucose, Bld: 98 mg/dL (ref 70–99)
Potassium: 2.9 mEq/L — ABNORMAL LOW (ref 3.5–5.1)
Sodium: 139 mEq/L (ref 135–145)
Total Bilirubin: 0.5 mg/dL (ref 0.2–1.2)
Total Protein: 7 g/dL (ref 6.0–8.3)

## 2022-09-18 LAB — LIPID PANEL
Cholesterol: 209 mg/dL — ABNORMAL HIGH (ref 0–200)
HDL: 41.6 mg/dL (ref >39.00)
LDL Cholesterol: 140 mg/dL — ABNORMAL HIGH (ref 0–99)
NonHDL: 167.73
Total CHOL/HDL Ratio: 5
Triglycerides: 139 mg/dL (ref 0.0–149.0)
VLDL: 27.8 mg/dL (ref 0.0–40.0)

## 2022-09-18 NOTE — Progress Notes (Signed)
Subjective:    Patient ID: Lauren Case, female    DOB: 1966/11/01, 56 y.o.   MRN: [id number]  HPI  Pt in for wellness exam.Pt is fasting.     Pt had colonoscopy last year and was negative.  Also she states had mammogram done. 08-05-2022. Negative.   On review she smoked for 40 years. For 20 years of that she smoked pack a day. Last 20 average 6 cigarettes a day.   Pt will get shingrix vaccine today.  Refer for cervical cancer screening.  Review of Systems  Constitutional:  Negative for chills, fatigue and fever.  Respiratory:  Negative for cough, chest tightness, shortness of breath and wheezing.   Cardiovascular:  Negative for chest pain and palpitations.  Gastrointestinal:  Negative for abdominal pain, diarrhea and vomiting.  Genitourinary:  Negative for dyspareunia, dysuria, enuresis and urgency.  Musculoskeletal:  Negative for back pain, joint swelling and neck stiffness.  Skin:  Negative for rash.  Neurological:  Negative for dizziness, seizures, weakness, numbness and headaches.  Hematological:  Negative for adenopathy. Does not bruise/bleed easily.  Psychiatric/Behavioral:  Negative for behavioral problems, dysphoric mood and hallucinations.     Past Medical History:  Diagnosis Date   Chicken pox    Environmental allergies    GERD (gastroesophageal reflux disease)    Hypertension    Measles    Mumps    Seasonal allergies    Vaginal Pap smear, abnormal      Social History   Socioeconomic History   Marital status: Single    Spouse name: Not on file   Number of children: 1   Years of education: Not on file   Highest education level: Not on file  Occupational History   Occupation: Operations Coodinator  Tobacco Use   Smoking status: Every Day    Packs/day: 1.00    Years: 30.00    Total pack years: 30.00    Types: Cigarettes   Smokeless tobacco: Never  Vaping Use   Vaping Use: Never used  Substance and Sexual Activity   Alcohol use: Yes     Alcohol/week: 10.0 standard drinks of alcohol    Types: 10 Shots of liquor per week    Comment: daily   Drug use: No   Sexual activity: Never    Birth control/protection: Surgical  Other Topics Concern   Not on file  Social History Narrative   Not on file   Social Determinants of Health   Financial Resource Strain: Not on file  Food Insecurity: Not on file  Transportation Needs: Not on file  Physical Activity: Not on file  Stress: Not on file  Social Connections: Not on file  Intimate Partner Violence: Not on file    Past Surgical History:  Procedure Laterality Date   ESSURE TUBAL LIGATION     TONSILLECTOMY  1984   WISDOM TOOTH EXTRACTION      Family History  Problem Relation Age of Onset   Healthy Mother        Living   Healthy Father        Living   Healthy Brother        x1   Alzheimer's disease Maternal Grandmother    Diabetes Maternal Grandfather        Borderline   Alzheimer's disease Paternal Grandfather    Allergies Son        x1   COPD Maternal Aunt    COPD Maternal Uncle    Colon cancer Neg Hx  Rectal cancer Neg Hx    Stomach cancer Neg Hx    Esophageal cancer Neg Hx     Allergies  Allergen Reactions   Other Anaphylaxis    Truffle Oils   Penicillins Anaphylaxis    Current Outpatient Medications on File Prior to Visit  Medication Sig Dispense Refill   albuterol (PROVENTIL HFA;VENTOLIN HFA) 108 (90 Base) MCG/ACT inhaler Inhale 2 puffs into the lungs every 6 (six) hours as needed for wheezing or shortness of breath. 1 Inhaler 2   amLODipine (NORVASC) 10 MG tablet TAKE 1 TABLET BY MOUTH EVERY DAY 90 tablet 0   buPROPion (WELLBUTRIN XL) 150 MG 24 hr tablet TAKE 1 TABLET BY MOUTH EVERY DAY 30 tablet 5   busPIRone (BUSPAR) 7.5 MG tablet Take 1 tablet (7.5 mg total) by mouth 2 (two) times daily. 180 tablet 0   cetirizine (ZYRTEC) 10 MG tablet Take 10 mg by mouth daily.     clonazePAM (KLONOPIN) 0.5 MG tablet Take 1 tablet (0.5 mg total) by mouth 2  (two) times daily as needed for anxiety. 14 tablet 0   ezetimibe (ZETIA) 10 MG tablet Take 1 tablet (10 mg total) by mouth daily. 30 tablet 3   FEROSUL 325 (65 Fe) MG tablet TAKE 1 TABLET BY MOUTH 3 TIMES DAILY (Patient taking differently: Take 1 time daily) 90 tablet 1   FLUoxetine (PROZAC) 20 MG capsule Take 3 capsules (60 mg total) by mouth daily. 270 capsule 0   hydrOXYzine (ATARAX/VISTARIL) 10 MG tablet TAKE ONE TABLET BY MOUTH THREE TIMES DAILY AS NEEDED FOR ITCHING 30 tablet 0   levocetirizine (XYZAL) 5 MG tablet Take 1 tablet (5 mg total) by mouth every evening. 30 tablet 3   losartan (COZAAR) 100 MG tablet TAKE 1 TABLET BY MOUTH EVERY DAY 90 tablet 0   nicotine (NICODERM CQ - DOSED IN MG/24 HR) 7 mg/24hr patch Place 1 patch (7 mg total) onto the skin daily. 28 patch 0   pantoprazole (PROTONIX) 40 MG tablet Take 1 tablet (40 mg total) by mouth daily. 90 tablet 4   potassium chloride (KLOR-CON M) 10 MEQ tablet Take 1 tablet (10 mEq total) by mouth daily. 7 tablet 0   rosuvastatin (CRESTOR) 10 MG tablet TAKE 1 TABLET BY MOUTH EVERY DAY FOR CHOLESTEROL 30 tablet 3   traZODone (DESYREL) 50 MG tablet Take 0.5 tablets (25 mg total) by mouth at bedtime as needed for sleep. 30 tablet 1   No current facility-administered medications on file prior to visit.    BP 122/77   Pulse 72   Temp 98.2 F (36.8 C)   Resp 18   Ht '5\' 4"'$  (1.626 m)   Wt 195 lb (88.5 kg)   SpO2 94%   BMI 33.47 kg/m        Objective:   Physical Exam  General Mental Status- Alert. General Appearance- Not in acute distress.   Skin General: Color- Normal Color. Moisture- Normal Moisture.  Neck Carotid Arteries- Normal color. Moisture- Normal Moisture. No carotid bruits. No JVD.  Chest and Lung Exam Auscultation: Breath Sounds:-Normal.  Cardiovascular Auscultation:Rythm- Regular. Murmurs & Other Heart Sounds:Auscultation of the heart reveals- No Murmurs.  Abdomen Inspection:-Inspeection  Normal. Palpation/Percussion:Note:No mass. Palpation and Percussion of the abdomen reveal- Non Tender, Non Distended + BS, no rebound or guarding.   Neurologic Cranial Nerve exam:- CN III-XII intact(No nystagmus), symmetric smile. Strength:- 5/5 equal and symmetric strength both upper and lower extremities.       Assessment & Plan:  Patient Instructions  For you wellness exam today I have ordered cbc, cmp and lipid panel.  Shingrix vaccine today.  Recommend exercise and healthy diet. Stop smoking completely.  We will let you know lab results as they come in.  Referral for screening lung cancer. If they don't call you by 2-3 weeks recommend call them directly.  Refer to gyn for cervical cancer screen/pap.   Follow up date appointment will be determined after lab review.       Mackie Pai, PA-C

## 2022-09-18 NOTE — Addendum Note (Signed)
Addended by: Jeronimo Greaves on: 09/18/2022 09:08 AM   Modules accepted: Orders

## 2022-09-18 NOTE — Addendum Note (Signed)
Addended by: Anabel Halon on: 09/18/2022 09:12 AM   Modules accepted: Orders

## 2022-09-18 NOTE — Patient Instructions (Addendum)
For you wellness exam today I have ordered cbc, cmp and lipid panel.  Shingrix vaccine today.  Recommend exercise and healthy diet. Stop smoking completely.  We will let you know lab results as they come in.  Referral for screening lung cancer. If they don't call you by 2-3 weeks recommend call them directly.  Refer to gyn for cervical cancer screen/pap.   Follow up date appointment will be determined after lab review.     Preventive Care 23-58 Years Old, Female Preventive care refers to lifestyle choices and visits with your health care provider that can promote health and wellness. Preventive care visits are also called wellness exams. What can I expect for my preventive care visit? Counseling Your health care provider may ask you questions about your: Medical history, including: Past medical problems. Family medical history. Pregnancy history. Current health, including: Menstrual cycle. Method of birth control. Emotional well-being. Home life and relationship well-being. Sexual activity and sexual health. Lifestyle, including: Alcohol, nicotine or tobacco, and drug use. Access to firearms. Diet, exercise, and sleep habits. Work and work Statistician. Sunscreen use. Safety issues such as seatbelt and bike helmet use. Physical exam Your health care provider will check your: Height and weight. These may be used to calculate your BMI (body mass index). BMI is a measurement that tells if you are at a healthy weight. Waist circumference. This measures the distance around your waistline. This measurement also tells if you are at a healthy weight and may help predict your risk of certain diseases, such as type 2 diabetes and high blood pressure. Heart rate and blood pressure. Body temperature. Skin for abnormal spots. What immunizations do I need?  Vaccines are usually given at various ages, according to a schedule. Your health care provider will recommend vaccines for you  based on your age, medical history, and lifestyle or other factors, such as travel or where you work. What tests do I need? Screening Your health care provider may recommend screening tests for certain conditions. This may include: Lipid and cholesterol levels. Diabetes screening. This is done by checking your blood sugar (glucose) after you have not eaten for a while (fasting). Pelvic exam and Pap test. Hepatitis B test. Hepatitis C test. HIV (human immunodeficiency virus) test. STI (sexually transmitted infection) testing, if you are at risk. Lung cancer screening. Colorectal cancer screening. Mammogram. Talk with your health care provider about when you should start having regular mammograms. This may depend on whether you have a family history of breast cancer. BRCA-related cancer screening. This may be done if you have a family history of breast, ovarian, tubal, or peritoneal cancers. Bone density scan. This is done to screen for osteoporosis. Talk with your health care provider about your test results, treatment options, and if necessary, the need for more tests. Follow these instructions at home: Eating and drinking  Eat a diet that includes fresh fruits and vegetables, whole grains, lean protein, and low-fat dairy products. Take vitamin and mineral supplements as recommended by your health care provider. Do not drink alcohol if: Your health care provider tells you not to drink. You are pregnant, may be pregnant, or are planning to become pregnant. If you drink alcohol: Limit how much you have to 0-1 drink a day. Know how much alcohol is in your drink. In the [place] one drink equals one 12 oz bottle of beer (355 mL), one 5 oz glass of wine (148 mL), or one 1 oz glass of hard liquor (44 mL). Lifestyle  Brush your teeth every morning and night with fluoride toothpaste. Floss one time each day. Exercise for at least 30 minutes 5 or more days each week. Do not use any products that  contain nicotine or tobacco. These products include cigarettes, chewing tobacco, and vaping devices, such as e-cigarettes. If you need help quitting, ask your health care provider. Do not use drugs. If you are sexually active, practice safe sex. Use a condom or other form of protection to prevent STIs. If you do not wish to become pregnant, use a form of birth control. If you plan to become pregnant, see your health care provider for a prepregnancy visit. Take aspirin only as told by your health care provider. Make sure that you understand how much to take and what form to take. Work with your health care provider to find out whether it is safe and beneficial for you to take aspirin daily. Find healthy ways to manage stress, such as: Meditation, yoga, or listening to music. Journaling. Talking to a trusted person. Spending time with friends and family. Minimize exposure to UV radiation to reduce your risk of skin cancer. Safety Always wear your seat belt while driving or riding in a vehicle. Do not drive: If you have been drinking alcohol. Do not ride with someone who has been drinking. When you are tired or distracted. While texting. If you have been using any mind-altering substances or drugs. Wear a helmet and other protective equipment during sports activities. If you have firearms in your house, make sure you follow all gun safety procedures. Seek help if you have been physically or sexually abused. What's next? Visit your health care provider once a year for an annual wellness visit. Ask your health care provider how often you should have your eyes and teeth checked. Stay up to date on all vaccines. This information is not intended to replace advice given to you by your health care provider. Make sure you discuss any questions you have with your health care provider. Document Revised: 04/17/2021 Document Reviewed: 04/17/2021 Elsevier Patient Education  [place].

## 2022-09-19 MED ORDER — POTASSIUM CHLORIDE CRYS ER 20 MEQ PO TBCR: 20.0000 meq | EXTENDED_RELEASE_TABLET | Freq: Three times a day (TID) | ORAL | 0 refills | Status: DC

## 2022-09-19 NOTE — Addendum Note (Signed)
Addended by: Anabel Halon on: 09/19/2022 08:04 AM   Modules accepted: Orders

## 2022-09-23 ENCOUNTER — Encounter: Payer: Self-pay | Admitting: General Practice

## 2022-09-24 ENCOUNTER — Other Ambulatory Visit (INDEPENDENT_AMBULATORY_CARE_PROVIDER_SITE_OTHER): Payer: 59

## 2022-09-24 DIAGNOSIS — E876 Hypokalemia: Secondary | ICD-10-CM | POA: Diagnosis not present

## 2022-09-24 LAB — COMPREHENSIVE METABOLIC PANEL
ALT: 48 U/L — ABNORMAL HIGH (ref 0–35)
AST: 145 U/L — ABNORMAL HIGH (ref 0–37)
Albumin: 4.4 g/dL (ref 3.5–5.2)
Alkaline Phosphatase: 96 U/L (ref 39–117)
BUN: 6 mg/dL (ref 6–23)
CO2: 28 mEq/L (ref 19–32)
Calcium: 9 mg/dL (ref 8.4–10.5)
Chloride: 99 mEq/L (ref 96–112)
Creatinine, Ser: 0.6 mg/dL (ref 0.40–1.20)
GFR: 98.62 mL/min (ref >60.00)
Glucose, Bld: 102 mg/dL — ABNORMAL HIGH (ref 70–99)
Potassium: 3.9 mEq/L (ref 3.5–5.1)
Sodium: 139 mEq/L (ref 135–145)
Total Bilirubin: 0.6 mg/dL (ref 0.2–1.2)
Total Protein: 7.3 g/dL (ref 6.0–8.3)

## 2022-10-09 ENCOUNTER — Other Ambulatory Visit: Payer: Self-pay | Admitting: Medical

## 2022-11-07 ENCOUNTER — Other Ambulatory Visit: Payer: Self-pay | Admitting: Medical

## 2022-11-18 ENCOUNTER — Other Ambulatory Visit: Payer: Self-pay | Admitting: Medical

## 2022-11-28 ENCOUNTER — Other Ambulatory Visit (HOSPITAL_COMMUNITY)
Admission: RE | Admit: 2022-11-28 | Discharge: 2022-11-28 | Disposition: A | Discharge status: Home / Self Care | Payer: 59 | Source: Ambulatory Visit | Attending: Obstetrics and Gynecology | Admitting: Obstetrics and Gynecology

## 2022-11-28 ENCOUNTER — Encounter: Payer: Self-pay | Admitting: Obstetrics and Gynecology

## 2022-11-28 ENCOUNTER — Ambulatory Visit (INDEPENDENT_AMBULATORY_CARE_PROVIDER_SITE_OTHER): Payer: 59 | Admitting: Obstetrics and Gynecology

## 2022-11-28 VITALS — BP 115/60 | HR 79 | Ht 64.0 in | Wt 192.0 lb

## 2022-11-28 DIAGNOSIS — Z01419 Encounter for gynecological examination (general) (routine) without abnormal findings: Secondary | ICD-10-CM | POA: Insufficient documentation

## 2022-11-28 NOTE — Progress Notes (Signed)
ANNUAL EXAM Patient name: Lauren Case MRN [id number]  Date of birth: Feb 10, 1966 Chief Complaint:   Gynecologic Exam  History of Present Illness:   Lauren Case is a 57 y.o. G3P0011 being seen today for a routine annual exam.  Current complaints: annual   No LMP recorded. Patient is postmenopausal.  No complaints. No breast or nipple changes. No vaginal bleeding or abnormal discharge.    Last pap     Component Value Date/Time   DIAGPAP  06/13/2019 0000    NEGATIVE FOR INTRAEPITHELIAL LESIONS OR MALIGNANCY.   ADEQPAP  06/13/2019 0000    Satisfactory for evaluation  endocervical/transformation zone component PRESENT.     Last mammogram: 08/2022 B[facility] 1 Last colonoscopy: 11/2021     09/18/2022    8:32 AM 02/05/2022    3:16 PM 06/27/2021    9:32 AM 03/02/2017    9:29 AM 12/03/2015    8:34 AM  Depression screen PHQ 2/9  Decreased Interest 0 1 0 0 0  Down, Depressed, Hopeless 0 2 0 0 0  PHQ - 2 Score 0 3 0 0 0  Altered sleeping  1  0   Tired, decreased energy  2  1   Change in appetite  2  0   Feeling bad or failure about yourself   1  0   Trouble concentrating  1  0   Moving slowly or fidgety/restless  1  0   Suicidal thoughts  0  0   PHQ-9 Score  11  1   Difficult doing work/chores  Somewhat difficult           02/05/2022    3:16 PM  GAD 7 : Generalized Anxiety Score  Nervous, Anxious, on Edge 0  Control/stop worrying 0  Worry too much - different things 1  Trouble relaxing 1  Restless 0  Easily annoyed or irritable 1  Afraid - awful might happen 1  Total GAD 7 Score 4  Anxiety Difficulty Somewhat difficult     Review of Systems:   Pertinent items are noted in HPI Denies any headaches, blurred vision, fatigue, shortness of breath, chest pain, abdominal pain, abnormal vaginal discharge/itching/odor/irritation, problems with periods, bowel movements, urination, or intercourse unless otherwise stated above. Pertinent History Reviewed:  Reviewed  past medical,surgical, social and family history.  Reviewed problem list, medications and allergies. Physical Assessment:   Vitals:   11/28/22 0838  BP: 115/60  Pulse: 79  Weight: 192 lb (87.1 kg)  Height: '5\' 4"'$  (1.626 m)  Body mass index is 32.96 kg/m.        Physical Examination:   General appearance - well appearing, and in no distress  Mental status - alert, oriented to person, place, and time  Psych:  She has a normal mood and affect  Skin - warm and dry, normal color, no suspicious lesions noted  Chest - effort normal, all lung fields clear to auscultation bilaterally  Heart - normal rate and regular rhythm  Breasts - breasts appear normal, no suspicious masses, no skin or nipple changes or  axillary nodes  Abdomen - soft, nontender, nondistended, no masses or organomegaly  Pelvic -  VULVA: normal appearing vulva with no masses, tenderness or lesions   VAGINA: normal appearing vagina with normal color and discharge, no lesions, arophic  CERVIX: normal appearing cervix, small without discharge or lesions, no CMT, nearly flush with vaginal wall  Thin prep pap is done with HR HPV cotesting  UTERUS: uterus is  felt to be small size, shape, consistency and nontender   ADNEXA: No adnexal masses or tenderness noted.  Extremities:  No swelling or varicosities noted  Chaperone present for exam  No results found for this or any previous visit (from the past 24 hour(s)).    Assessment & Plan:  1. Well woman exam with routine gynecological exam Routine pap Mammo UTD Colonoscopy UTD Continue routine care - Cytology - PAP( [place])   No orders of the defined types were placed in this encounter.   Meds: No orders of the defined types were placed in this encounter.   Follow-up: No follow-ups on file.  Darliss Cheney, MD 11/28/2022 9:25 AM

## 2022-12-03 LAB — CYTOLOGY - PAP
Comment: NEGATIVE
Comment: NEGATIVE
Comment: NEGATIVE
HPV 16: NEGATIVE
HPV 18 / 45: NEGATIVE
High risk HPV: POSITIVE — AB

## 2022-12-04 ENCOUNTER — Telehealth: Payer: Self-pay

## 2022-12-04 NOTE — Telephone Encounter (Signed)
Patient called and made aware of need for colposcopy due to abnormal pap smear. Patient scheduled. Kathrene Alu RN

## 2022-12-04 NOTE — Telephone Encounter (Signed)
-----  Message from Darliss Cheney, MD sent at 12/04/2022  4:22 PM EST ----- Please scheduled patient for colposcopy

## 2022-12-08 ENCOUNTER — Other Ambulatory Visit: Payer: Self-pay | Admitting: Medical

## 2023-01-02 ENCOUNTER — Other Ambulatory Visit (HOSPITAL_COMMUNITY)
Admission: RE | Admit: 2023-01-02 | Discharge: 2023-01-02 | Disposition: A | Discharge status: Home / Self Care | Payer: Managed Care, Other (non HMO) | Source: Ambulatory Visit | Attending: Obstetrics and Gynecology | Admitting: Obstetrics and Gynecology

## 2023-01-02 ENCOUNTER — Encounter: Payer: Self-pay | Admitting: General Practice

## 2023-01-02 ENCOUNTER — Ambulatory Visit (INDEPENDENT_AMBULATORY_CARE_PROVIDER_SITE_OTHER): Payer: Managed Care, Other (non HMO) | Admitting: Obstetrics and Gynecology

## 2023-01-02 ENCOUNTER — Encounter: Payer: Self-pay | Admitting: Obstetrics and Gynecology

## 2023-01-02 VITALS — BP 121/68 | HR 79 | Wt 194.0 lb

## 2023-01-02 DIAGNOSIS — B977 Papillomavirus as the cause of diseases classified elsewhere: Secondary | ICD-10-CM

## 2023-01-02 DIAGNOSIS — R87810 Cervical high risk human papillomavirus (HPV) DNA test positive: Secondary | ICD-10-CM | POA: Diagnosis not present

## 2023-01-02 DIAGNOSIS — N72 Inflammatory disease of cervix uteri: Secondary | ICD-10-CM | POA: Diagnosis present

## 2023-01-02 DIAGNOSIS — R87612 Low grade squamous intraepithelial lesion on cytologic smear of cervix (LGSIL): Secondary | ICD-10-CM | POA: Insufficient documentation

## 2023-01-02 NOTE — Progress Notes (Signed)
    GYNECOLOGY OFFICE COLPOSCOPY PROCEDURE NOTE  57 y.o. [id number] here for colposcopy for low-grade squamous intraepithelial neoplasia (LGSIL - encompassing HPV,mild dysplasia,CIN I) with high risk HPV pap smear on 11/2022. Discussed role for HPV in cervical dysplasia, need for surveillance.  Several years ago (prior to children) had cryotherapy and then laser therapy for abnormal pap smear. No additional cervical procedures.   Patient gave informed written consent, time out was performed.  Placed in lithotomy position. Cervix viewed with speculum and colposcope after application of acetic acid.   Colposcopy adequate? Yes - nearly flush cervix with minimal cervical os opening or depth  no visible lesions, no mosaicism, no punctation, no abnormal vasculature, and acetowhite lesion(s)/slight bleeding after acetic acid application noted at 9 o'clock; corresponding biopsy obtained.  ECC specimen obtained. All specimens were labeled and sent to pathology.  Chaperone was present during entire procedure.  Patient was given post procedure instructions.  Will follow up pathology and manage accordingly; patient will be contacted with results and recommendations.  Routine preventative health maintenance measures emphasized.  1. LGSIL on Pap smear of cervix 2. High risk human papilloma virus (HPV) infection of cervix Now s/p uncomplicated colposcopy with biopsy, will follow up per ASCCP guidelines    Darliss Cheney, MD [place], [facility] for [employer], [place]

## 2023-01-02 NOTE — Addendum Note (Signed)
Addended by: Cindi Carbon on: 01/02/2023 11:38 AM   Modules accepted: Orders

## 2023-01-06 ENCOUNTER — Encounter: Payer: Self-pay | Admitting: Obstetrics and Gynecology

## 2023-01-06 LAB — SURGICAL PATHOLOGY

## 2023-01-13 ENCOUNTER — Other Ambulatory Visit: Payer: Self-pay | Admitting: Medical

## 2023-02-06 ENCOUNTER — Other Ambulatory Visit: Payer: Self-pay | Admitting: Medical

## 2023-02-17 ENCOUNTER — Other Ambulatory Visit: Payer: Self-pay | Admitting: Medical

## 2023-03-10 ENCOUNTER — Other Ambulatory Visit: Payer: Self-pay | Admitting: Medical

## 2023-04-15 ENCOUNTER — Other Ambulatory Visit: Payer: Self-pay | Admitting: Medical

## 2023-04-22 ENCOUNTER — Ambulatory Visit (INDEPENDENT_AMBULATORY_CARE_PROVIDER_SITE_OTHER): Payer: Managed Care, Other (non HMO) | Admitting: Medical

## 2023-04-22 VITALS — BP 122/60 | HR 84 | Temp 98.0°F | Resp 18 | Ht 64.0 in | Wt 203.0 lb

## 2023-04-22 DIAGNOSIS — I10 Essential (primary) hypertension: Secondary | ICD-10-CM | POA: Diagnosis not present

## 2023-04-22 DIAGNOSIS — F419 Anxiety disorder, unspecified: Secondary | ICD-10-CM | POA: Diagnosis not present

## 2023-04-22 DIAGNOSIS — F329 Major depressive disorder, single episode, unspecified: Secondary | ICD-10-CM

## 2023-04-22 DIAGNOSIS — E785 Hyperlipidemia, unspecified: Secondary | ICD-10-CM

## 2023-04-22 DIAGNOSIS — D649 Anemia, unspecified: Secondary | ICD-10-CM

## 2023-04-22 NOTE — Patient Instructions (Signed)
Left Lower Eyelid Bump: Increasing in size and causing itching. Dermatologist unable to intervene due to proximity to the eye. Patient considering referral to an ophthalmologist. - Patient to check insurance deductible and inform if referral to ophthalmologist is desired.  Alcohol Use with Elevated Liver Enzymes: Daily alcohol use with history of elevated liver enzymes. Patient has reduced alcohol intake but continues to drink daily. - Advise patient to abstain from alcohol for one week. - Order metabolic panel to check liver enzymes. - Consider liver ultrasound if enzymes remain elevated after alcohol abstinence.  Iron Deficiency Anemia: History of fluctuating iron levels, previously resolved with supplementation. Patient reports symptoms suggestive of low iron levels. - Order CBC and iron panel. - Based on results, consider appropriate iron supplementation or referral to a hematologist.  Hypertension: Well controlled on Amlodipine 10mg  and Losartan 100mg  daily. - Continue current medications.  Tobacco Use: Reduced from 1.5 packs per day to 4 cigarettes per day. - Encourage further reduction and eventual cessation of smoking. - Referred to pulmonology for assessment of eligibility for annual CT lung cancer screening. Please call their office.  Hyperlipidemia: Not currently on statin therapy. - Order lipid panel to assess current cholesterol levels.  Depression and Anxiety: Managed with Prozac 60mg  daily and BuSpar. Patient reports improved mood and reduced stress since moving in with father. - Continue current medications.  Follow-up: To be determined based on lab results.

## 2023-04-22 NOTE — Progress Notes (Signed)
Subjective:    Patient ID: Lauren Case, female    DOB: 11-01-1966, 57 y.o.   MRN: [id number]  HPI Discussed the use of AI scribe software for clinical note transcription with the patient, who gave verbal consent to proceed.  History of Present Illness   The patient presents with a persistent left lower eyelid bump that has been present for the past year. She reports that the bump has been increasing in size and is associated with itching. The patient has previously consulted a dermatologist who advised against intervention due to the bump's proximity to the eye. She is considering seeking further specialist advice for potential removal.  The patient also reports a history of fluctuating iron levels, with periods of both high and low iron. She describes symptoms of itching and tongue discomfort when her iron levels are low. The patient is not currently on iron supplements but has taken them in the past. She has a history of anemia and has had polyps removed following a colonoscopy. Gi told her no need for colonoscopy for another 3 years.  In terms of mental health, the patient is currently on Prozac 60 mg daily for depression and anxiety. She reports a significant reduction in stress since moving in with her father and note that her son is doing well. She also takes BuSpar, which she reports has improved her sleep.  The patient has a history of elevated liver enzymes and admits to daily alcohol consumption, although she reports a significant reduction in the amount she drinks compared to the past. She expresses a desire to quit smoking and has reduced her intake to four cigarettes a day.  The patient is currently on amlodipine 10 mg and losartan 100 mg daily for blood pressure management, which was reported as good control today during the consultation. She is not currently on any statin medication.       Review of Systems See hpi.     Objective:   Physical Exam  General Mental  Status- Alert. General Appearance- Not in acute distress.   Skin General: Color- Normal Color. Moisture- Normal Moisture.  Neck Carotid Arteries- Normal color. Moisture- Normal Moisture. No carotid bruits. No JVD.  Chest and Lung Exam Auscultation: Breath Sounds:-Normal.  Cardiovascular Auscultation:Rythm- Regular. Murmurs & Other Heart Sounds:Auscultation of the heart reveals- No Murmurs.  Abdomen Inspection:-Inspeection Normal. Palpation/Percussion:Note:No mass. Palpation and Percussion of the abdomen reveal- Non Tender, Non Distended + BS, no rebound or guarding.   Neurologic Cranial Nerve exam:- CN III-XII intact(No nystagmus), symmetric smile. Strength:- 5/5 equal and symmetric strength both upper and lower extremities.   Eyes- perrl bilateral. Left lower lid small raised bump about 5 mm in size. Uniform in color.    Assessment & Plan:   Assessment and Plan    Left Lower Eyelid Bump: Increasing in size and causing itching. Dermatologist unable to intervene due to proximity to the eye. Patient considering referral to an ophthalmologist. - Patient to check insurance deductible and inform if referral to ophthalmologist is desired.  Alcohol Use with Elevated Liver Enzymes: Daily alcohol use with history of elevated liver enzymes. Patient has reduced alcohol intake but continues to drink daily. - Advise patient to abstain from alcohol for one week. - Order metabolic panel to check liver enzymes. - Consider liver ultrasound if enzymes remain elevated after alcohol abstinence.  Iron Deficiency Anemia: History of fluctuating iron levels, previously resolved with supplementation. Patient reports symptoms suggestive of low iron levels. - Order CBC and iron  panel. - Based on results, consider appropriate iron supplementation or referral to a hematologist.  Hypertension: Well controlled on Amlodipine 10mg  and Losartan 100mg  daily. - Continue current medications.  Tobacco Use:  Reduced from 1.5 packs per day to 4 cigarettes per day. - Encourage further reduction and eventual cessation of smoking. - Referred to pulmonology for assessment of eligibility for annual CT lung cancer screening. Please call their office.  Hyperlipidemia: Not currently on statin therapy. - Order lipid panel to assess current cholesterol levels.  Depression and Anxiety: Managed with Prozac 60mg  daily and BuSpar. Patient reports improved mood and reduced stress since moving in with father. - Continue current medications.  Follow-up: To be determined based on lab results.       Labs to be done within a week fasting. Want it to be done after not drinking alcohol for one week.  Esperanza Richters, PA-C

## 2023-04-27 ENCOUNTER — Ambulatory Visit: Payer: Managed Care, Other (non HMO) | Admitting: Medical

## 2023-05-01 ENCOUNTER — Other Ambulatory Visit (INDEPENDENT_AMBULATORY_CARE_PROVIDER_SITE_OTHER): Payer: Managed Care, Other (non HMO)

## 2023-05-01 DIAGNOSIS — D649 Anemia, unspecified: Secondary | ICD-10-CM | POA: Diagnosis not present

## 2023-05-01 DIAGNOSIS — E785 Hyperlipidemia, unspecified: Secondary | ICD-10-CM | POA: Diagnosis not present

## 2023-05-01 DIAGNOSIS — I10 Essential (primary) hypertension: Secondary | ICD-10-CM

## 2023-05-01 LAB — CBC WITH DIFFERENTIAL/PLATELET
Basophils Absolute: 0 10*3/uL (ref 0.0–0.1)
Basophils Relative: 0.5 % (ref 0.0–3.0)
Eosinophils Absolute: 0.1 10*3/uL (ref 0.0–0.7)
Eosinophils Relative: 1.3 % (ref 0.0–5.0)
HCT: 34.4 % — ABNORMAL LOW (ref 36.0–46.0)
Hemoglobin: 10.3 g/dL — ABNORMAL LOW (ref 12.0–15.0)
Lymphocytes Relative: 31.5 % (ref 12.0–46.0)
Lymphs Abs: 2.3 10*3/uL (ref 0.7–4.0)
MCHC: 29.9 g/dL — ABNORMAL LOW (ref 30.0–36.0)
MCV: 72.3 fl — ABNORMAL LOW (ref 78.0–100.0)
Monocytes Absolute: 0.6 10*3/uL (ref 0.1–1.0)
Monocytes Relative: 8.3 % (ref 3.0–12.0)
Neutro Abs: 4.3 10*3/uL (ref 1.4–7.7)
Neutrophils Relative %: 58.4 % (ref 43.0–77.0)
Platelets: 162 10*3/uL (ref 150.0–400.0)
RBC: 4.76 Mil/uL (ref 3.87–5.11)
RDW: 16.7 % — ABNORMAL HIGH (ref 11.5–15.5)
WBC: 7.3 10*3/uL (ref 4.0–10.5)

## 2023-05-01 LAB — LIPID PANEL
Cholesterol: 221 mg/dL — ABNORMAL HIGH (ref 0–200)
HDL: 38.3 mg/dL — ABNORMAL LOW (ref >39.00)
LDL Cholesterol: 145 mg/dL — ABNORMAL HIGH (ref 0–99)
NonHDL: 182.52
Total CHOL/HDL Ratio: 6
Triglycerides: 186 mg/dL — ABNORMAL HIGH (ref 0.0–149.0)
VLDL: 37.2 mg/dL (ref 0.0–40.0)

## 2023-05-01 LAB — COMPREHENSIVE METABOLIC PANEL
ALT: 53 U/L — ABNORMAL HIGH (ref 0–35)
AST: 101 U/L — ABNORMAL HIGH (ref 0–37)
Albumin: 4.5 g/dL (ref 3.5–5.2)
Alkaline Phosphatase: 86 U/L (ref 39–117)
BUN: 7 mg/dL (ref 6–23)
CO2: 29 mEq/L (ref 19–32)
Calcium: 9.4 mg/dL (ref 8.4–10.5)
Chloride: 99 mEq/L (ref 96–112)
Creatinine, Ser: 0.69 mg/dL (ref 0.40–1.20)
GFR: 94.95 mL/min (ref >60.00)
Glucose, Bld: 103 mg/dL — ABNORMAL HIGH (ref 70–99)
Potassium: 3.5 mEq/L (ref 3.5–5.1)
Sodium: 140 mEq/L (ref 135–145)
Total Bilirubin: 0.6 mg/dL (ref 0.2–1.2)
Total Protein: 7.5 g/dL (ref 6.0–8.3)

## 2023-05-01 MED ORDER — IRON (FERROUS SULFATE) 325 (65 FE) MG PO TABS: ORAL_TABLET | ORAL | 1 refills | Status: DC

## 2023-05-01 NOTE — Addendum Note (Signed)
Addended by: Gwenevere Abbot on: 05/01/2023 09:12 PM   Modules accepted: Orders

## 2023-05-02 LAB — IRON,TIBC AND FERRITIN PANEL
%SAT: 4 % (calc) — ABNORMAL LOW (ref 16–45)
Ferritin: 7 ng/mL — ABNORMAL LOW (ref 16–232)
Iron: 24 ug/dL — ABNORMAL LOW (ref 45–160)
TIBC: 605 mcg/dL (calc) — ABNORMAL HIGH (ref 250–450)

## 2023-05-19 ENCOUNTER — Ambulatory Visit: Payer: Managed Care, Other (non HMO) | Admitting: Medical

## 2023-05-19 VITALS — BP 122/74 | HR 93 | Temp 98.2°F | Resp 18 | Ht 64.0 in | Wt 204.0 lb

## 2023-05-19 DIAGNOSIS — N6489 Other specified disorders of breast: Secondary | ICD-10-CM | POA: Diagnosis not present

## 2023-05-19 DIAGNOSIS — R059 Cough, unspecified: Secondary | ICD-10-CM | POA: Diagnosis not present

## 2023-05-19 DIAGNOSIS — R928 Other abnormal and inconclusive findings on diagnostic imaging of breast: Secondary | ICD-10-CM

## 2023-05-19 LAB — POC COVID19 BINAXNOW: SARS Coronavirus 2 Ag: NEGATIVE

## 2023-05-19 MED ORDER — AZITHROMYCIN 250 MG PO TABS: ORAL_TABLET | ORAL | 0 refills | Status: AC

## 2023-05-19 MED ORDER — FLUTICASONE PROPIONATE 50 MCG/ACT NA SUSP: 2.0000 | Freq: Every day | NASAL | 1 refills | Status: AC

## 2023-05-19 MED ORDER — BENZONATATE 100 MG PO CAPS: 100.0000 mg | ORAL_CAPSULE | Freq: Three times a day (TID) | ORAL | 0 refills | Status: AC | PRN

## 2023-05-19 NOTE — Progress Notes (Signed)
   Subjective:    Patient ID: Lauren Case, female    DOB: Mar 13, 1966, 57 y.o.   MRN: [id number]  HPI Discussed the use of AI scribe software for clinical note transcription with the patient, who gave verbal consent to proceed.  History of Present Illness   The patient presents with recent onset of nasal and chest congestion, accompanied by a dry throat and postnasal drainage. She reports some dry cough and ear pain, contributing to an overall feeling of malaise. The patient describes the symptoms as reminiscent of the onset of bronchitis.   She also reports experiencing intermittent chills with more than moderate fatigue, to the point of needing to lay down for extended periods. The patient notes a slight shortness of breath like may get bronchitis. Additionally, she mentions discomfort in the upper back muscles.  The patient's symptoms have raised concerns about a possible COVID-19 infection, given recent cases in our office/community. However, no testing or confirmation of this suspicion has been reported at the time of this consultation.   Pt has hx of smoking. less recently but still 5 cigarrettes a day        Review of Systems See hpi.    Objective:   Physical Exam  General Mental Status- Alert. General Appearance- Not in acute distress.   Skin General: Color- Normal Color. Moisture- Normal Moisture.  Neck Carotid Arteries- Normal color. Moisture- Normal Moisture. No carotid bruits. No JVD.  Chest and Lung Exam Auscultation: Breath Sounds:-clear, even and unlabored presently.  Cardiovascular Auscultation:Rythm- Regular. Murmurs & Other Heart Sounds:Auscultation of the heart reveals- No Murmurs.  Abdomen Inspection:-Inspeection Normal. Palpation/Percussion:Note:No mass. Palpation and Percussion of the abdomen reveal- Non Tender, Non Distended + BS, no rebound or guarding.   Neurologic Cranial Nerve exam:- CN III-XII intact(No nystagmus), symmetric  smile. Strength:- 5/5 equal and symmetric strength both upper and lower extremities.   Heent- sounds nasal congested. No sinus pressure.    Assessment & Plan:   Assessment and Plan    Upper Respiratory Infection vs early bronchitis: Symptoms of nasal congestion, postnasal drainage, dry throat, cough, ear pain, and fatigue. Mild shortness of breath reported. No fever reported. Given the current prevalence of COVID-19, it is a possible differential diagnosis. -Perform a COVID-19 swab test to rule out infection. was negative. -rx flonase for any nasal congestion, benzonatate for cough and albuterol if needed for wheezing. -with smoking history and hx of bronchitis will make azithromycin available to start if you get increased chest congestion with productive cough  Muscle Pain: Complaint of upper back muscle pain. -Recommend over-the-counter ibuprofen as needed for pain relief.   follow up 7-10 days or sooner if needed        [employer] [employer]

## 2023-05-19 NOTE — Patient Instructions (Addendum)
Upper Respiratory Infection vs early bronchitis: Symptoms of nasal congestion, postnasal drainage, dry throat, cough, ear pain, and fatigue. Mild shortness of breath reported. No fever reported. Given the current prevalence of COVID-19, it is a possible differential diagnosis. -Perform a COVID-19 swab test to rule out infection. was negative. -rx flonase for any nasal congestion, benzonatate for cough and albuterol if needed for wheezing. -with smoking history and hx of bronchitis will make azithromycin available to start if you get increased chest congestion with productive cough  Muscle Pain: Complaint of upper back muscle pain. -Recommend over-the-counter ibuprofen as needed for pain relief.   follow up 7-10 days or sooner if needed  We discussed if feeling worse can also recheck covid test on Thursday. If test + before 5th day of symptoms would give antiviral.

## 2023-06-08 ENCOUNTER — Other Ambulatory Visit: Payer: Self-pay | Admitting: Medical

## 2023-06-17 ENCOUNTER — Other Ambulatory Visit: Payer: Self-pay | Admitting: Medical

## 2023-06-26 NOTE — Progress Notes (Signed)
This encounter was created in error - please disregard.

## 2023-07-08 ENCOUNTER — Other Ambulatory Visit: Payer: Self-pay | Admitting: Medical

## 2023-07-13 ENCOUNTER — Other Ambulatory Visit: Payer: Self-pay | Admitting: Medical

## 2023-08-04 LAB — HM MAMMOGRAPHY

## 2023-08-05 ENCOUNTER — Telehealth: Payer: Self-pay | Admitting: Medical

## 2023-08-05 ENCOUNTER — Encounter: Payer: Self-pay | Admitting: Medical

## 2023-08-05 NOTE — Telephone Encounter (Signed)
Lauren Case from [facility] stated pt came in for a routine mammogram and now needs additional orders. Diagnostic mammogram and [place] of L breast. For any question # is [phone]

## 2023-08-07 ENCOUNTER — Other Ambulatory Visit: Payer: Self-pay | Admitting: Medical

## 2023-08-09 NOTE — Addendum Note (Signed)
Addended by: Gwenevere Abbot on: 08/09/2023 08:37 PM   Modules accepted: Orders

## 2023-08-09 NOTE — Telephone Encounter (Signed)
I just put in pt diagnostic mammogram and [place] order. See scheduling instruction on location. Thanks.

## 2023-08-10 ENCOUNTER — Encounter: Payer: Self-pay | Admitting: Medical

## 2023-08-12 ENCOUNTER — Encounter: Payer: Self-pay | Admitting: Medical

## 2023-08-14 ENCOUNTER — Other Ambulatory Visit: Payer: Self-pay | Admitting: Medical

## 2023-09-02 ENCOUNTER — Other Ambulatory Visit: Payer: Self-pay | Admitting: Medical

## 2023-09-06 IMAGING — US US ABDOMEN COMPLETE
1 series · 13 of 25 positions shown · non-contrast
Comparison: Ultrasound October 11, 2020

CLINICAL DATA: Hepatic steatosis.

EXAM:
ABDOMEN ULTRASOUND COMPLETE

[Series 1: us abdomen complete · 13 of 93 slices shown]
[im 1/93]
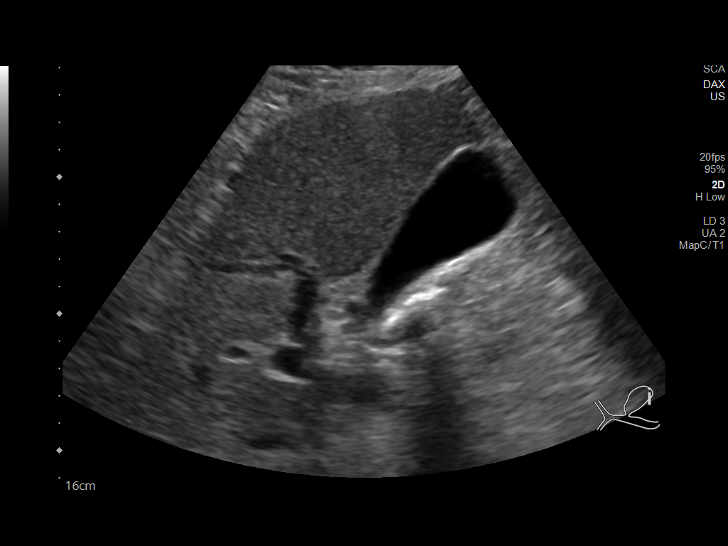
[im 8/93]
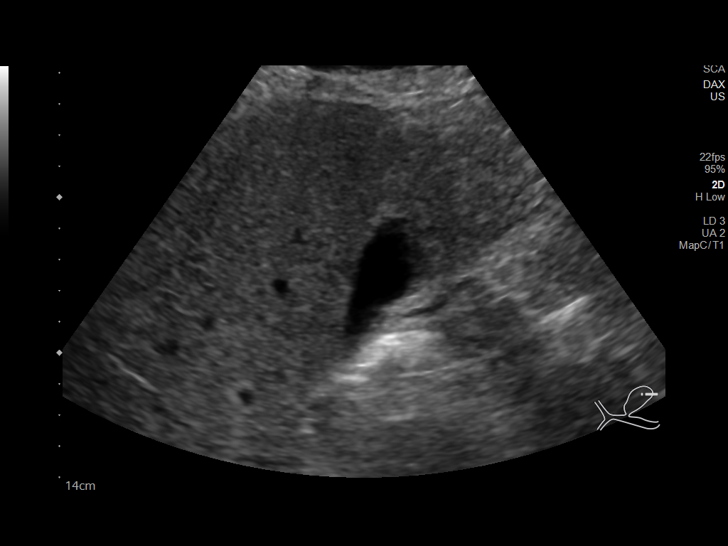
[im 16/93]
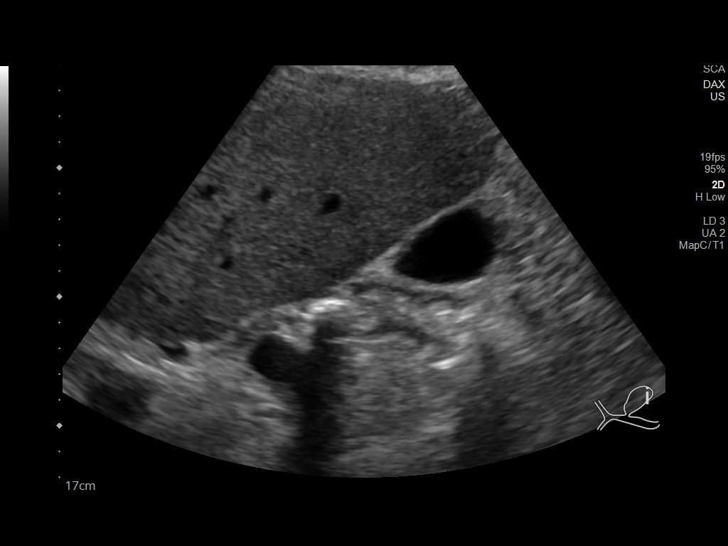
[im 24/93]
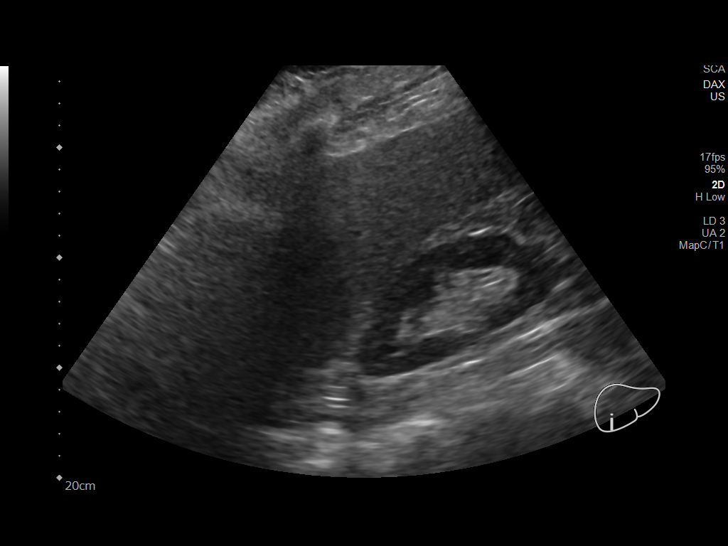
[im 31/93]
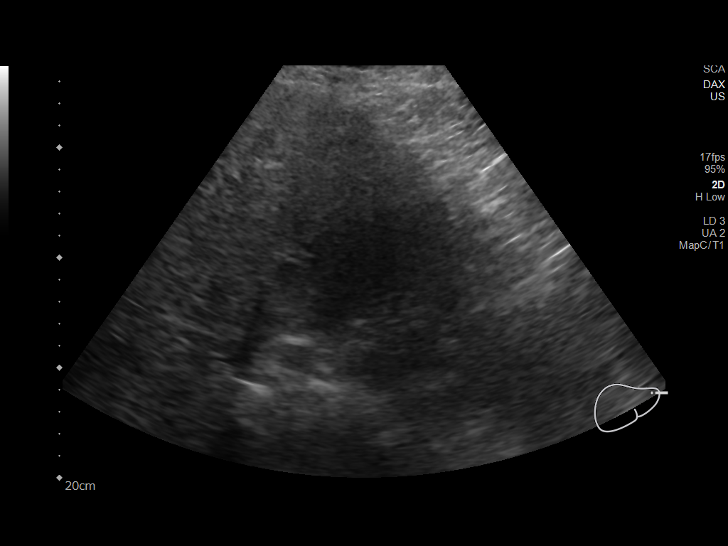
[im 39/93]
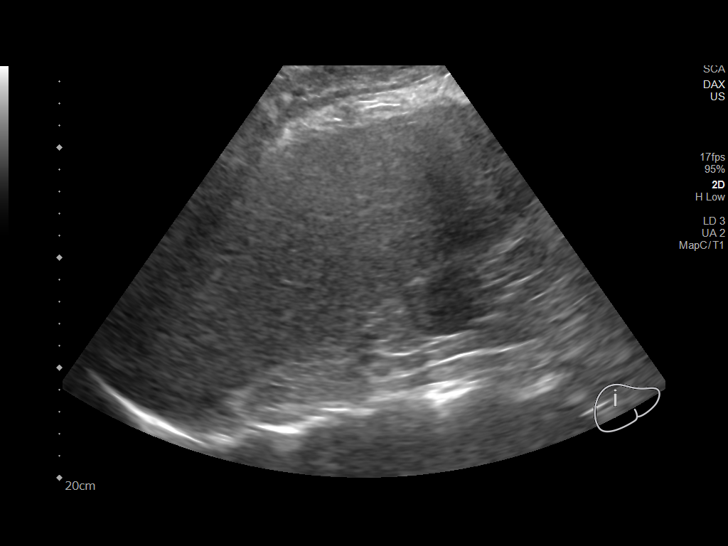
[im 47/93]
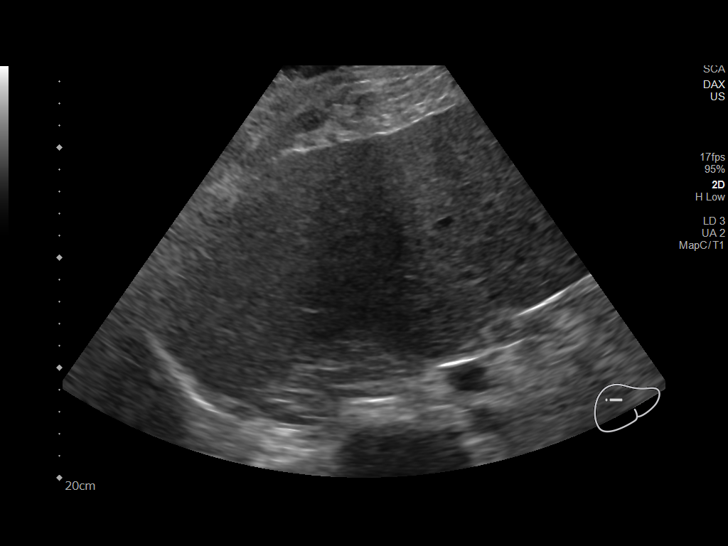
[im 54/93]
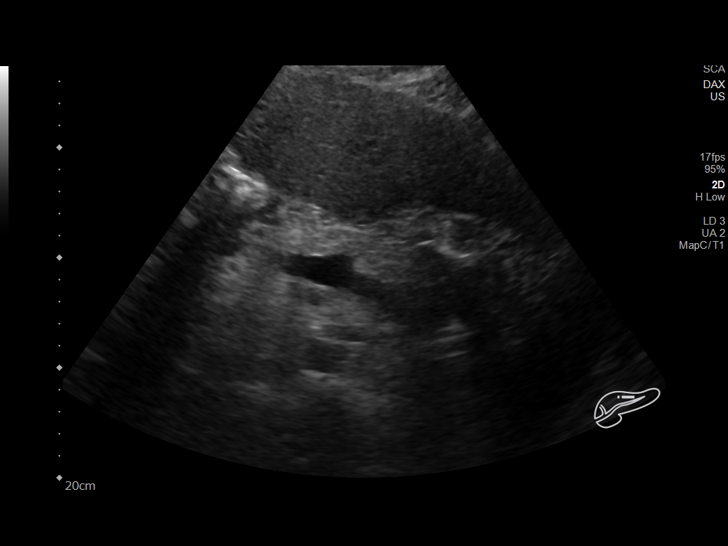
[im 62/93]
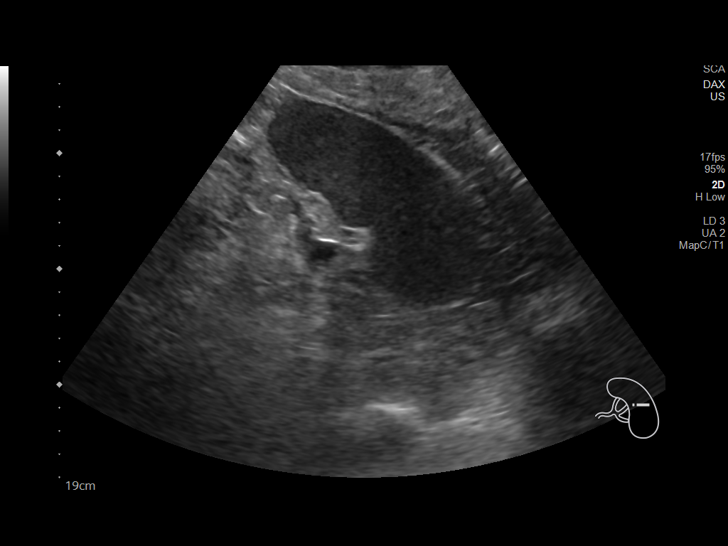
[im 70/93]
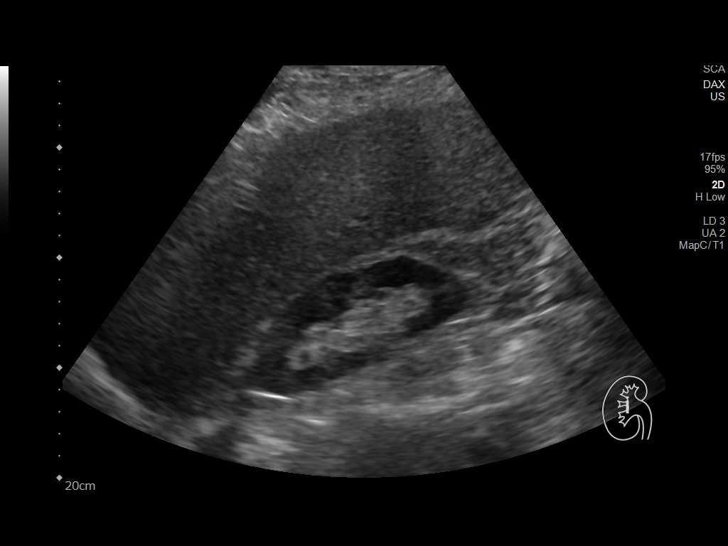
[im 77/93]
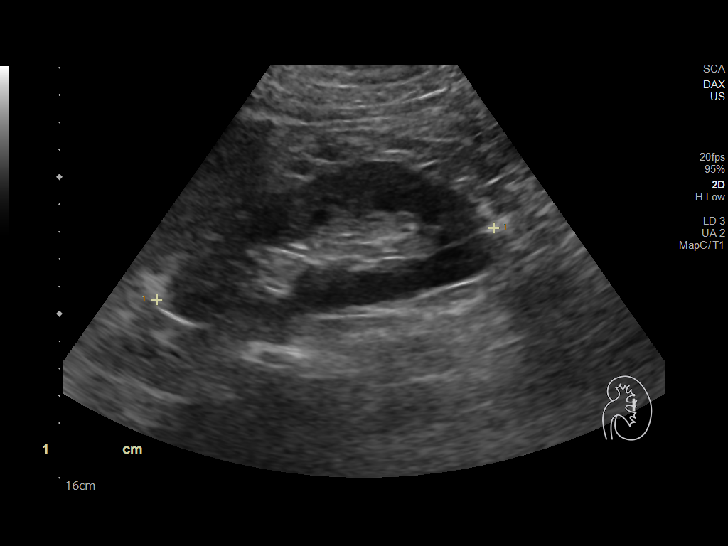
[im 85/93]
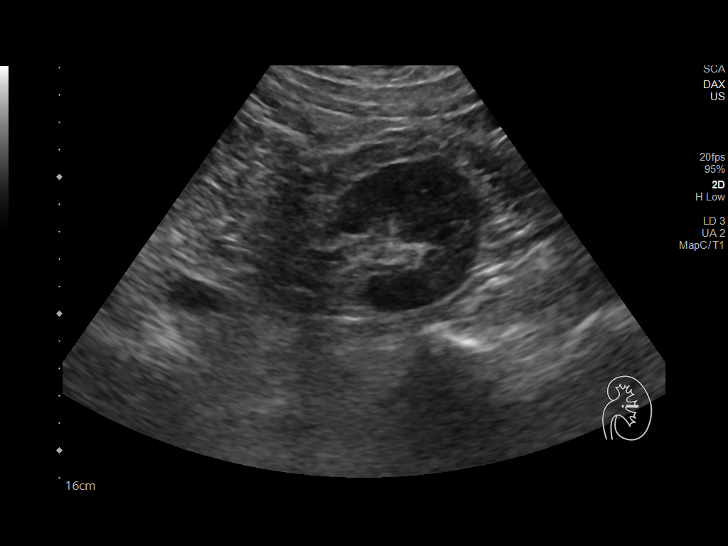
[im 93/93]
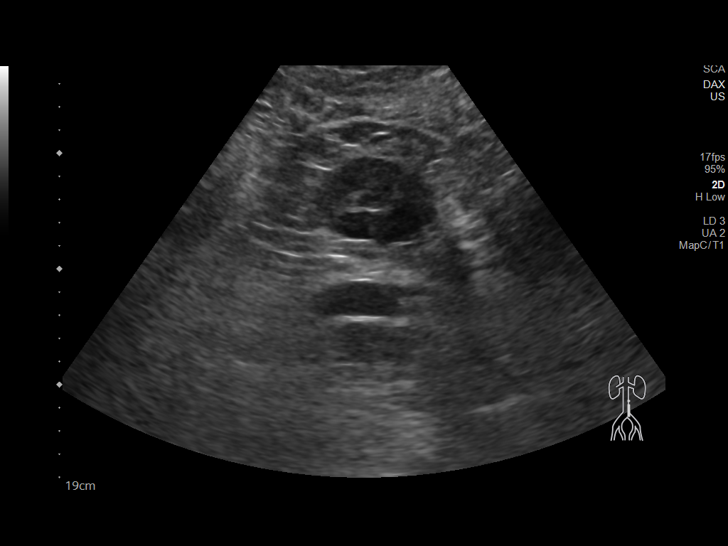

[13 of 25 positions shown; findings below may reference images not displayed]

FINDINGS: Gallbladder: Gallbladder polyp measuring 5 mm. No gallstones or wall
thickening visualized. No sonographic Murphy sign noted by
sonographer.

Common bile duct: Diameter: 4 mm

Liver: No focal lesion identified. Diffusely increased parenchymal
echogenicity. Portal vein is patent on color Doppler imaging with
normal direction of blood flow towards the liver.

IVC: No abnormality visualized.

Pancreas: Visualized portion unremarkable.

Spleen: Size and appearance within normal limits.

Right Kidney: Length: 11.4 cm. Echogenicity within normal limits. No
mass or hydronephrosis visualized.

Left Kidney: Length: 12.6 cm. Echogenicity within normal limits. No
mass or hydronephrosis visualized.

Abdominal aorta: No aneurysm visualized.

Other findings: None.
IMPRESSION: 1. The liver is diffusely increased in echogenicity, suggesting
fatty infiltration. There are no obvious focal liver lesions.
2. Gallbladder polyp measuring 5 mm. No further evaluation or
follow-up necessary based on size criteria. Per consensus
guidelines, this requires no additional evaluation or specific
follow-up. This recommendation follows ACR consensus guidelines:
White Paper of the ACR Incidental findings Committee II on
Gallbladder and Biliary Findings. [HOSPITAL] 4616:;[DATE].

## 2023-09-07 ENCOUNTER — Other Ambulatory Visit: Payer: Self-pay | Admitting: Medical

## 2023-10-13 ENCOUNTER — Other Ambulatory Visit: Payer: Self-pay | Admitting: Medical

## 2023-10-30 ENCOUNTER — Ambulatory Visit (INDEPENDENT_AMBULATORY_CARE_PROVIDER_SITE_OTHER): Payer: Managed Care, Other (non HMO) | Admitting: Medical

## 2023-10-30 ENCOUNTER — Encounter: Payer: Self-pay | Admitting: Medical

## 2023-10-30 ENCOUNTER — Encounter: Payer: Managed Care, Other (non HMO) | Admitting: Medical

## 2023-10-30 VITALS — BP 138/80 | HR 88 | Temp 98.2°F | Resp 18 | Ht 64.0 in | Wt 205.4 lb

## 2023-10-30 DIAGNOSIS — R748 Abnormal levels of other serum enzymes: Secondary | ICD-10-CM | POA: Diagnosis not present

## 2023-10-30 DIAGNOSIS — Z0000 Encounter for general adult medical examination without abnormal findings: Secondary | ICD-10-CM | POA: Diagnosis not present

## 2023-10-30 DIAGNOSIS — F17200 Nicotine dependence, unspecified, uncomplicated: Secondary | ICD-10-CM | POA: Diagnosis not present

## 2023-10-30 NOTE — Patient Instructions (Signed)
For you wellness exam today I have ordered cbc, cmp and lipid panel. Future fasting for next week.  Vaccine up to date. Discussed that you report having got second shingrix vaccine.  Ct lung cancer referral placed.  Recommend exercise and healthy diet.  We will let you know lab results as they come in.  Follow up date appointment will be determined after lab review.    Preventive Care 3-59 Years Old, Female Preventive care refers to lifestyle choices and visits with your health care provider that can promote health and wellness. Preventive care visits are also called wellness exams. What can I expect for my preventive care visit? Counseling Your health care provider may ask you questions about your: Medical history, including: Past medical problems. Family medical history. Pregnancy history. Current health, including: Menstrual cycle. Method of birth control. Emotional well-being. Home life and relationship well-being. Sexual activity and sexual health. Lifestyle, including: Alcohol, nicotine or tobacco, and drug use. Access to firearms. Diet, exercise, and sleep habits. Work and work Astronomer. Sunscreen use. Safety issues such as seatbelt and bike helmet use. Physical exam Your health care provider will check your: Height and weight. These may be used to calculate your BMI (body mass index). BMI is a measurement that tells if you are at a healthy weight. Waist circumference. This measures the distance around your waistline. This measurement also tells if you are at a healthy weight and may help predict your risk of certain diseases, such as type 2 diabetes and high blood pressure. Heart rate and blood pressure. Body temperature. Skin for abnormal spots. What immunizations do I need?  Vaccines are usually given at various ages, according to a schedule. Your health care provider will recommend vaccines for you based on your age, medical history, and lifestyle or other  factors, such as travel or where you work. What tests do I need? Screening Your health care provider may recommend screening tests for certain conditions. This may include: Lipid and cholesterol levels. Diabetes screening. This is done by checking your blood sugar (glucose) after you have not eaten for a while (fasting). Pelvic exam and Pap test. Hepatitis B test. Hepatitis C test. HIV (human immunodeficiency virus) test. STI (sexually transmitted infection) testing, if you are at risk. Lung cancer screening. Colorectal cancer screening. Mammogram. Talk with your health care provider about when you should start having regular mammograms. This may depend on whether you have a family history of breast cancer. BRCA-related cancer screening. This may be done if you have a family history of breast, ovarian, tubal, or peritoneal cancers. Bone density scan. This is done to screen for osteoporosis. Talk with your health care provider about your test results, treatment options, and if necessary, the need for more tests. Follow these instructions at home: Eating and drinking  Eat a diet that includes fresh fruits and vegetables, whole grains, lean protein, and low-fat dairy products. Take vitamin and mineral supplements as recommended by your health care provider. Do not drink alcohol if: Your health care provider tells you not to drink. You are pregnant, may be pregnant, or are planning to become pregnant. If you drink alcohol: Limit how much you have to 0-1 drink a day. Know how much alcohol is in your drink. In the [place] one drink equals one 12 oz bottle of beer (355 mL), one 5 oz glass of wine (148 mL), or one 1 oz glass of hard liquor (44 mL). Lifestyle Brush your teeth every morning and night with fluoride toothpaste.  Floss one time each day. Exercise for at least 30 minutes 5 or more days each week. Do not use any products that contain nicotine or tobacco. These products include  cigarettes, chewing tobacco, and vaping devices, such as e-cigarettes. If you need help quitting, ask your health care provider. Do not use drugs. If you are sexually active, practice safe sex. Use a condom or other form of protection to prevent STIs. If you do not wish to become pregnant, use a form of birth control. If you plan to become pregnant, see your health care provider for a prepregnancy visit. Take aspirin only as told by your health care provider. Make sure that you understand how much to take and what form to take. Work with your health care provider to find out whether it is safe and beneficial for you to take aspirin daily. Find healthy ways to manage stress, such as: Meditation, yoga, or listening to music. Journaling. Talking to a trusted person. Spending time with friends and family. Minimize exposure to UV radiation to reduce your risk of skin cancer. Safety Always wear your seat belt while driving or riding in a vehicle. Do not drive: If you have been drinking alcohol. Do not ride with someone who has been drinking. When you are tired or distracted. While texting. If you have been using any mind-altering substances or drugs. Wear a helmet and other protective equipment during sports activities. If you have firearms in your house, make sure you follow all gun safety procedures. Seek help if you have been physically or sexually abused. What's next? Visit your health care provider once a year for an annual wellness visit. Ask your health care provider how often you should have your eyes and teeth checked. Stay up to date on all vaccines. This information is not intended to replace advice given to you by your health care provider. Make sure you discuss any questions you have with your health care provider. Document Revised: 04/17/2021 Document Reviewed: 04/17/2021 Elsevier Patient Education  2024 [employer].

## 2023-10-30 NOTE — Progress Notes (Signed)
Subjective:    Patient ID: Lauren Case, female    DOB: Sep 27, 1966, 57 y.o.   MRN: [id number]  HPI  Pt in for wellness exam.  Pt is not fasting. Pt works for [employer].    Also she states had mammogram done. [id number]. Negative but had to get [place] for clarfiication.   On review she smoked for 40 years. For 20 years of that she smoked pack a day. Last 20 average 6 cigarettes a day.  Pt up to date on papsmears.  Pt states she got 2nd shingrix vaccine her. She states she remembers was worse than 1st one.  CT lung cancer screening test ordered. Pt declined when they called. She will get done if place referral again.    Review of Systems  Constitutional:  Negative for chills and fatigue.  HENT:  Negative for congestion.   Respiratory:  Negative for cough, chest tightness, shortness of breath and wheezing.   Cardiovascular:  Negative for chest pain and palpitations.  Gastrointestinal:  Negative for abdominal pain, blood in stool, diarrhea and nausea.  Genitourinary:  Negative for dysuria and frequency.  Neurological:  Negative for facial asymmetry, weakness and numbness.  Hematological:  Negative for adenopathy.  Psychiatric/Behavioral:  Negative for behavioral problems, decreased concentration and dysphoric mood. The patient is not nervous/anxious.     Past Medical History:  Diagnosis Date   Chicken pox    Environmental allergies    GERD (gastroesophageal reflux disease)    Hypertension    Measles    Mumps    Seasonal allergies    Vaginal Pap smear, abnormal      Social History   Socioeconomic History   Marital status: Single    Spouse name: Not on file   Number of children: 1   Years of education: Not on file   Highest education level: 12th grade  Occupational History   Occupation: Operations Coodinator  Tobacco Use   Smoking status: Every Day    Current packs/day: 1.00    Average packs/day: 1 pack/day for 30.0 years (30.0 ttl pk-yrs)    Types: Cigarettes    Smokeless tobacco: Never  Vaping Use   Vaping status: Never Used  Substance and Sexual Activity   Alcohol use: Yes    Alcohol/week: 10.0 standard drinks of alcohol    Types: 10 Shots of liquor per week    Comment: daily   Drug use: No   Sexual activity: Never    Birth control/protection: Surgical  Other Topics Concern   Not on file  Social History Narrative   Not on file   Social Drivers of Health   Financial Resource Strain: Low Risk  (04/16/2023)   Overall Financial Resource Strain (CARDIA)    Difficulty of Paying Living Expenses: Not hard at all  Food Insecurity: No Food Insecurity (04/16/2023)   Hunger Vital Sign    Worried About Running Out of Food in the Last Year: Never true    Ran Out of Food in the Last Year: Never true  Transportation Needs: No Transportation Needs (04/16/2023)   PRAPARE - Administrator, Civil Service (Medical): No    Lack of Transportation (Non-Medical): No  Physical Activity: Insufficiently Active (04/16/2023)   Exercise Vital Sign    Days of Exercise per Week: 3 days    Minutes of Exercise per Session: 30 min  Stress: No Stress Concern Present (04/16/2023)   [employer] of Occupational Health - Occupational Stress Questionnaire    Feeling  of Stress : Only a little  Social Connections: Moderately Isolated (04/16/2023)   Social Connection and Isolation Panel [NHANES]    Frequency of Communication with Friends and Family: More than three times a week    Frequency of Social Gatherings with Friends and Family: Twice a week    Attends Religious Services: More than 4 times per year    Active Member of [employer] or Organizations: No    Attends Engineer, structural: Not on file    Marital Status: Never married  Intimate Partner Violence: Unknown (02/07/2022)   Received from [facility], [facility]   HITS    Physically Hurt: Not on file    Insult or Talk Down To: Not on file    Threaten Physical Harm: Not on file    Scream or  Curse: Not on file    Past Surgical History:  Procedure Laterality Date   ESSURE TUBAL LIGATION     TONSILLECTOMY  1984   WISDOM TOOTH EXTRACTION      Family History  Problem Relation Age of Onset   Healthy Mother        Living   Healthy Father        Living   Healthy Brother        x1   Alzheimer's disease Maternal Grandmother    Diabetes Maternal Grandfather        Borderline   Alzheimer's disease Paternal Grandfather    Allergies Son        x1   COPD Maternal Aunt    COPD Maternal Uncle    Colon cancer Neg Hx    Rectal cancer Neg Hx    Stomach cancer Neg Hx    Esophageal cancer Neg Hx     Allergies  Allergen Reactions   Other Anaphylaxis    Truffle Oils   Penicillins Anaphylaxis    Current Outpatient Medications on File Prior to Visit  Medication Sig Dispense Refill   albuterol (PROVENTIL HFA;VENTOLIN HFA) 108 (90 Base) MCG/ACT inhaler Inhale 2 puffs into the lungs every 6 (six) hours as needed for wheezing or shortness of breath. 1 Inhaler 2   amLODipine (NORVASC) 10 MG tablet TAKE 1 TABLET BY MOUTH EVERY DAY 30 tablet 0   benzonatate (TESSALON) 100 MG capsule Take 1 capsule (100 mg total) by mouth 3 (three) times daily as needed for cough. 30 capsule 0   buPROPion (WELLBUTRIN XL) 150 MG 24 hr tablet TAKE 1 TABLET BY MOUTH EVERY DAY 30 tablet 5   busPIRone (BUSPAR) 7.5 MG tablet TAKE 1 TABLET BY MOUTH 2 TIMES A DAY 180 tablet 0   cetirizine (ZYRTEC) 10 MG tablet Take 10 mg by mouth daily.     clonazePAM (KLONOPIN) 0.5 MG tablet Take 1 tablet (0.5 mg total) by mouth 2 (two) times daily as needed for anxiety. (Patient not taking: Reported on 01/02/2023) 14 tablet 0   ezetimibe (ZETIA) 10 MG tablet Take 1 tablet (10 mg total) by mouth daily. (Patient not taking: Reported on 01/02/2023) 30 tablet 3   FEROSUL 325 (65 Fe) MG tablet TAKE 1 TABLET BY MOUTH 3 TIMES DAILY (Patient not taking: Reported on 01/02/2023) 90 tablet 1   FEROSUL 325 (65 Fe) MG tablet TAKE 1 TABLET BY  MOUTH 2 TIMES A DAY 60 tablet 1   FLUoxetine (PROZAC) 20 MG capsule Take 3 capsules (60 mg total) by mouth daily. 270 capsule 0   fluticasone (FLONASE) 50 MCG/ACT nasal spray Place 2 sprays into both  nostrils daily. 16 g 1   hydrOXYzine (ATARAX/VISTARIL) 10 MG tablet TAKE ONE TABLET BY MOUTH THREE TIMES DAILY AS NEEDED FOR ITCHING (Patient not taking: Reported on 01/02/2023) 30 tablet 0   levocetirizine (XYZAL) 5 MG tablet Take 1 tablet (5 mg total) by mouth every evening. 30 tablet 3   losartan (COZAAR) 100 MG tablet TAKE 1 TABLET BY MOUTH EVERY DAY 30 tablet 0   nicotine (NICODERM CQ - DOSED IN MG/24 HR) 7 mg/24hr patch Place 1 patch (7 mg total) onto the skin daily. (Patient not taking: Reported on 01/02/2023) 28 patch 0   pantoprazole (PROTONIX) 40 MG tablet Take 1 tablet (40 mg total) by mouth daily. (Patient not taking: Reported on 01/02/2023) 90 tablet 4   potassium chloride SA (KLOR-CON M) 20 MEQ tablet Take 1 tablet (20 mEq total) by mouth 3 (three) times daily. (Patient not taking: Reported on 01/02/2023) 15 tablet 0   rosuvastatin (CRESTOR) 10 MG tablet TAKE 1 TABLET BY MOUTH EVERY DAY FOR CHOLESTEROL (Patient not taking: Reported on 01/02/2023) 30 tablet 3   traZODone (DESYREL) 50 MG tablet Take 0.5 tablets (25 mg total) by mouth at bedtime as needed for sleep. 30 tablet 1   No current facility-administered medications on file prior to visit.    BP (!) 140/75   Pulse 88   Temp 98.2 F (36.8 C)   Resp 18   Ht 5\' 4"  (1.626 m)   Wt 205 lb 6.4 oz (93.2 kg)   SpO2 93%   BMI 35.26 kg/m   138/80 on recheck.     Objective:   Physical Exam  General Mental Status- Alert. General Appearance- Not in acute distress.   Skin General: Color- Normal Color. Moisture- Normal Moisture.  Neck Carotid Arteries- Normal color. Moisture- Normal Moisture. No carotid bruits. No JVD.  Chest and Lung Exam Auscultation: Breath Sounds:-Normal.  Cardiovascular Auscultation:Rythm- Regular. Murmurs &  Other Heart Sounds:Auscultation of the heart reveals- No Murmurs.  Abdomen Inspection:-Inspeection Normal. Palpation/Percussion:Note:No mass. Palpation and Percussion of the abdomen reveal- Non Tender, Non Distended + BS, no rebound or guarding.   Neurologic Cranial Nerve exam:- CN III-XII intact(No nystagmus), symmetric smile. Strength:- 5/5 equal and symmetric strength both upper and lower extremities.       Assessment & Plan:   For you wellness exam today I have ordered cbc, cmp and lipid panel. Future fasting for next week.  Vaccine up to date. Discussed that you report having got second shingrix vaccine.  Ct lung cancer referral placed.  Recommend exercise and healthy diet.  We will let you know lab results as they come in.  Follow up date appointment will be determined after lab review.    Hold form for work and fill out when lipid panel is back.

## 2023-11-02 ENCOUNTER — Other Ambulatory Visit (INDEPENDENT_AMBULATORY_CARE_PROVIDER_SITE_OTHER): Payer: Managed Care, Other (non HMO)

## 2023-11-02 DIAGNOSIS — Z0000 Encounter for general adult medical examination without abnormal findings: Secondary | ICD-10-CM

## 2023-11-02 LAB — LIPID PANEL
Cholesterol: 232 mg/dL — ABNORMAL HIGH (ref 0–200)
HDL: 48.8 mg/dL (ref >39.00)
LDL Cholesterol: 154 mg/dL — ABNORMAL HIGH (ref 0–99)
NonHDL: 183.36
Total CHOL/HDL Ratio: 5
Triglycerides: 148 mg/dL (ref 0.0–149.0)
VLDL: 29.6 mg/dL (ref 0.0–40.0)

## 2023-11-02 LAB — CBC WITH DIFFERENTIAL/PLATELET
Basophils Absolute: 0.1 10*3/uL (ref 0.0–0.1)
Basophils Relative: 0.9 % (ref 0.0–3.0)
Eosinophils Absolute: 0.1 10*3/uL (ref 0.0–0.7)
Eosinophils Relative: 0.8 % (ref 0.0–5.0)
HCT: 49.3 % — ABNORMAL HIGH (ref 36.0–46.0)
Hemoglobin: 16.2 g/dL — ABNORMAL HIGH (ref 12.0–15.0)
Lymphocytes Relative: 25.2 % (ref 12.0–46.0)
Lymphs Abs: 1.8 10*3/uL (ref 0.7–4.0)
MCHC: 32.8 g/dL (ref 30.0–36.0)
MCV: 102.1 fL — ABNORMAL HIGH (ref 78.0–100.0)
Monocytes Absolute: 0.5 10*3/uL (ref 0.1–1.0)
Monocytes Relative: 7.5 % (ref 3.0–12.0)
Neutro Abs: 4.7 10*3/uL (ref 1.4–7.7)
Neutrophils Relative %: 65.6 % (ref 43.0–77.0)
Platelets: 150 10*3/uL (ref 150.0–400.0)
RBC: 4.82 Mil/uL (ref 3.87–5.11)
RDW: 15 % (ref 11.5–15.5)
WBC: 7.1 10*3/uL (ref 4.0–10.5)

## 2023-11-02 LAB — COMPREHENSIVE METABOLIC PANEL
ALT: 46 U/L — ABNORMAL HIGH (ref 0–35)
AST: 75 U/L — ABNORMAL HIGH (ref 0–37)
Albumin: 4.6 g/dL (ref 3.5–5.2)
Alkaline Phosphatase: 82 U/L (ref 39–117)
BUN: 11 mg/dL (ref 6–23)
CO2: 27 meq/L (ref 19–32)
Calcium: 9.1 mg/dL (ref 8.4–10.5)
Chloride: 97 meq/L (ref 96–112)
Creatinine, Ser: 0.7 mg/dL (ref 0.40–1.20)
GFR: 94.29 mL/min (ref >60.00)
Glucose, Bld: 98 mg/dL (ref 70–99)
Potassium: 3.9 meq/L (ref 3.5–5.1)
Sodium: 138 meq/L (ref 135–145)
Total Bilirubin: 0.7 mg/dL (ref 0.2–1.2)
Total Protein: 7.6 g/dL (ref 6.0–8.3)

## 2023-11-05 ENCOUNTER — Other Ambulatory Visit: Payer: Self-pay | Admitting: Medical

## 2023-11-08 ENCOUNTER — Telehealth: Payer: Self-pay | Admitting: Medical

## 2023-11-08 NOTE — Telephone Encounter (Signed)
 Finished form and will place on your desk. Please fax or have pt pick up.

## 2023-11-09 NOTE — Telephone Encounter (Signed)
 faxed

## 2023-11-16 ENCOUNTER — Other Ambulatory Visit: Payer: Self-pay | Admitting: Medical

## 2023-11-28 IMAGING — DX DG CHEST 2V
2 series · 2 of 2 positions shown · non-contrast
Comparison: None.

CLINICAL DATA: Cough and congestion

EXAM:
CHEST - 2 VIEW

[chest pa]
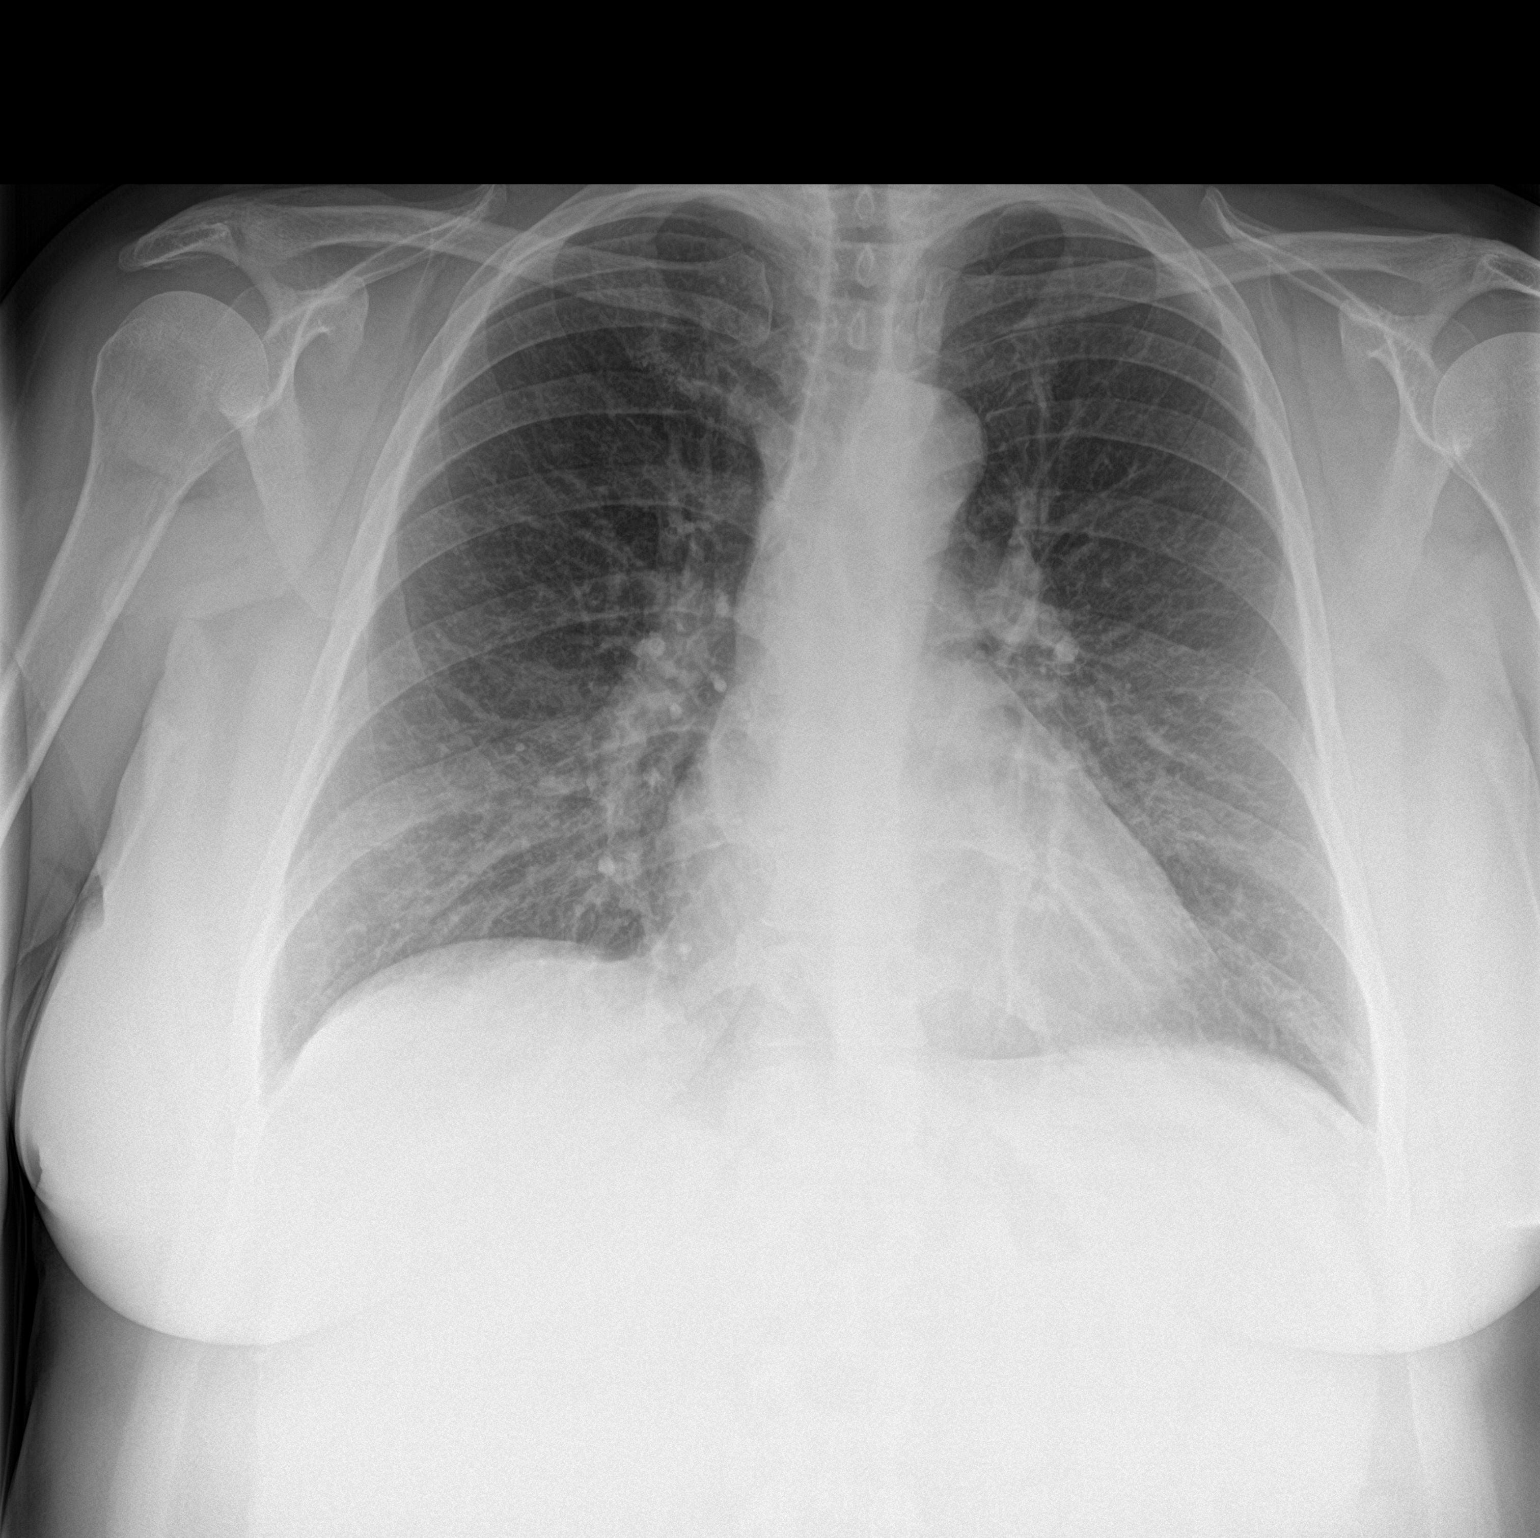

[chest lat]
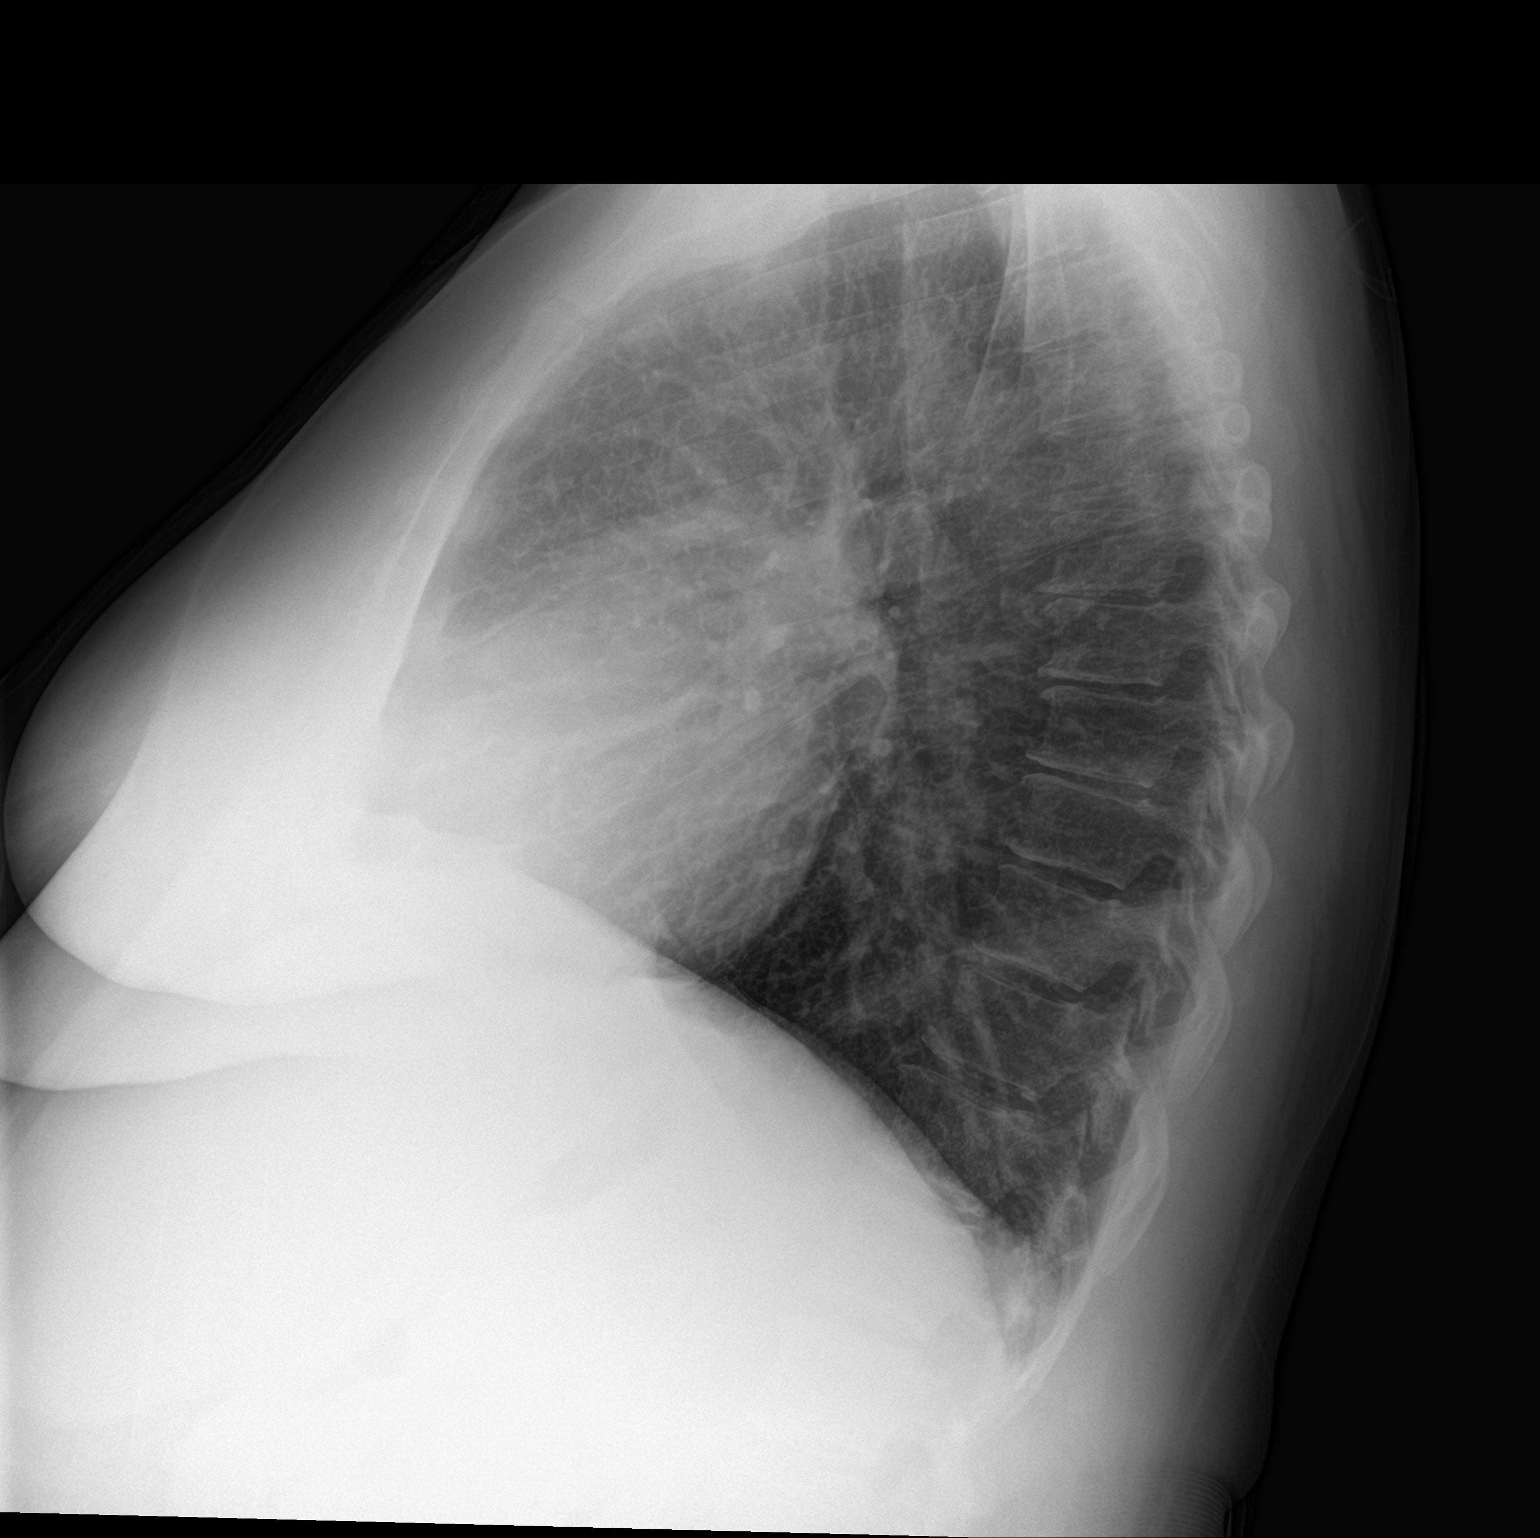

[2 of 2 positions shown; findings below may reference images not displayed]

FINDINGS: The lungs are clear and negative for focal airspace consolidation,
pulmonary edema or suspicious pulmonary nodule. No pleural effusion
or pneumothorax. Cardiac and mediastinal contours are within normal
limits. No acute fracture or lytic or blastic osseous lesions. The
visualized upper abdominal bowel gas pattern is unremarkable.
IMPRESSION: No active cardiopulmonary disease.

## 2023-12-07 ENCOUNTER — Other Ambulatory Visit: Payer: Self-pay | Admitting: Medical

## 2023-12-14 ENCOUNTER — Other Ambulatory Visit: Payer: Self-pay | Admitting: Medical

## 2024-01-12 ENCOUNTER — Encounter: Payer: Self-pay | Admitting: Medical

## 2024-01-12 ENCOUNTER — Other Ambulatory Visit: Payer: Self-pay | Admitting: Medical

## 2024-02-10 ENCOUNTER — Other Ambulatory Visit: Payer: Self-pay | Admitting: Medical

## 2024-03-08 ENCOUNTER — Other Ambulatory Visit: Payer: Self-pay | Admitting: Medical

## 2024-03-09 ENCOUNTER — Other Ambulatory Visit: Payer: Self-pay | Admitting: Medical

## 2024-04-04 ENCOUNTER — Other Ambulatory Visit: Payer: Self-pay | Admitting: Medical

## 2024-04-13 ENCOUNTER — Other Ambulatory Visit: Payer: Self-pay | Admitting: Medical

## 2024-05-11 ENCOUNTER — Other Ambulatory Visit: Payer: Self-pay | Admitting: Medical

## 2024-06-03 ENCOUNTER — Other Ambulatory Visit: Payer: Self-pay | Admitting: Medical

## 2024-06-03 NOTE — Telephone Encounter (Signed)
 Just filled today

## 2024-06-09 ENCOUNTER — Other Ambulatory Visit: Payer: Self-pay | Admitting: Medical

## 2024-07-05 ENCOUNTER — Other Ambulatory Visit: Payer: Self-pay | Admitting: Medical

## 2024-07-05 ENCOUNTER — Telehealth: Payer: Self-pay

## 2024-07-05 MED ORDER — FLUOXETINE HCL 20 MG PO CAPS: 60.0000 mg | ORAL_CAPSULE | Freq: Every day | ORAL | 0 refills | Status: DC

## 2024-07-05 NOTE — Telephone Encounter (Signed)
 Rx sent.

## 2024-07-11 ENCOUNTER — Other Ambulatory Visit: Payer: Self-pay | Admitting: Medical

## 2024-07-19 ENCOUNTER — Ambulatory Visit (INDEPENDENT_AMBULATORY_CARE_PROVIDER_SITE_OTHER): Admitting: Medical

## 2024-07-19 VITALS — BP 124/78 | HR 83 | Temp 98.3°F | Resp 16 | Ht 64.0 in | Wt 209.4 lb

## 2024-07-19 DIAGNOSIS — E669 Obesity, unspecified: Secondary | ICD-10-CM | POA: Diagnosis not present

## 2024-07-19 DIAGNOSIS — F17200 Nicotine dependence, unspecified, uncomplicated: Secondary | ICD-10-CM | POA: Diagnosis not present

## 2024-07-19 DIAGNOSIS — F1010 Alcohol abuse, uncomplicated: Secondary | ICD-10-CM

## 2024-07-19 DIAGNOSIS — R739 Hyperglycemia, unspecified: Secondary | ICD-10-CM | POA: Diagnosis not present

## 2024-07-19 DIAGNOSIS — R748 Abnormal levels of other serum enzymes: Secondary | ICD-10-CM | POA: Diagnosis not present

## 2024-07-19 DIAGNOSIS — Z122 Encounter for screening for malignant neoplasm of respiratory organs: Secondary | ICD-10-CM | POA: Diagnosis not present

## 2024-07-19 NOTE — Progress Notes (Signed)
 Subjective:    Patient ID: Lauren Case, female    DOB: [birth year], 58 y.o.   MRN: [id number]  HPI Lauren Case is a 58 year old female with obesity who presents with concerns about weight management.  She is concerned about her weight, describing a feeling of 'getting fatter and fatter' and is interested in exploring weight management options. Her BMI is 35. She has previously used diet pills with success but is unsure of their current availability. She is attempting to manage her weight by avoiding eating out and using a new exercise device she found online.  She has a history of elevated blood sugar levels but has not been diagnosed with diabetes. She has not had an A1c test recently, with the last blood work done in December during her annual wellness exam. She mentions her uncle lost weight on metformin, a medication for diabetes, and wonders if there is a genetic component to weight loss with this medication.  She has a history of smoking for over 40 years and acknowledges the financial and health benefits of quitting. She has not yet quit smoking and has not used vaping as a cessation tool, expressing a strong dislike for it.  She consumes alcohol daily, approximately six to eight ounces of vodka. She has reduced her alcohol intake over time, noting that it was higher in her past. She has a history of fatty liver disease. She is concerned about the impact of alcohol on her liver and is cautious about medications that could further affect liver function.  She is up to date with her vaccinations, including flu and pneumonia, and is planning to receive the shingles vaccine soon. She has a history of elevated liver enzymes and is aware of the need for regular monitoring. She is scheduled for a mammogram next month and plans to schedule a colonoscopy after the first of the year. She had a colonoscopy in 2023 where polyps were found and removed, with no malignancy  detected.    Review of Systems  Constitutional:  Negative for chills and fatigue.  Respiratory:  Negative for cough, chest tightness, shortness of breath and wheezing.   Cardiovascular:  Negative for chest pain and palpitations.  Gastrointestinal:  Negative for abdominal pain, blood in stool, constipation, nausea and vomiting.  Genitourinary:  Negative for dysuria and frequency.  Musculoskeletal:  Negative for back pain, joint swelling and neck pain.  Skin:  Negative for rash.  Neurological:  Negative for dizziness, syncope, weakness and numbness.  Hematological:  Negative for adenopathy.  Psychiatric/Behavioral:  Negative for behavioral problems, decreased concentration and dysphoric mood.     Past Medical History:  Diagnosis Date   Chicken pox    Environmental allergies    GERD (gastroesophageal reflux disease)    Hypertension    Measles    Mumps    Seasonal allergies    Vaginal Pap smear, abnormal      Social History   Socioeconomic History   Marital status: Single    Spouse name: Not on file   Number of children: 1   Years of education: Not on file   Highest education level: 12th grade  Occupational History   Occupation: Operations Coodinator  Tobacco Use   Smoking status: Every Day    Current packs/day: 1.00    Average packs/day: 1 pack/day for 30.0 years (30.0 ttl pk-yrs)    Types: Cigarettes   Smokeless tobacco: Never  Vaping Use   Vaping status: Never Used  Substance  and Sexual Activity   Alcohol use: Yes    Alcohol/week: 10.0 standard drinks of alcohol    Types: 10 Shots of liquor per week    Comment: daily   Drug use: No   Sexual activity: Never    Birth control/protection: Surgical  Other Topics Concern   Not on file  Social History Narrative   Not on file   Social Drivers of Health   Financial Resource Strain: Low Risk  (07/19/2024)   Overall Financial Resource Strain (CARDIA)    Difficulty of Paying Living Expenses: Not hard at all  Food  Insecurity: No Food Insecurity (07/19/2024)   Hunger Vital Sign    Worried About Running Out of Food in the Last Year: Never true    Ran Out of Food in the Last Year: Never true  Transportation Needs: No Transportation Needs (07/19/2024)   PRAPARE - Administrator, Civil Service (Medical): No    Lack of Transportation (Non-Medical): No  Physical Activity: Insufficiently Active (07/19/2024)   Exercise Vital Sign    Days of Exercise per Week: 3 days    Minutes of Exercise per Session: 20 min  Stress: Stress Concern Present (07/19/2024)   [employer] of Occupational Health - Occupational Stress Questionnaire    Feeling of Stress: To some extent  Social Connections: Moderately Integrated (07/19/2024)   Social Connection and Isolation Panel    Frequency of Communication with Friends and Family: More than three times a week    Frequency of Social Gatherings with Friends and Family: Twice a week    Attends Religious Services: More than 4 times per year    Active Member of [employer] or Organizations: Yes    Attends Banker Meetings: More than 4 times per year    Marital Status: Never married  Intimate Partner Violence: Unknown (02/07/2022)   Received from [facility]   HITS    Physically Hurt: Not on file    Insult or Talk Down To: Not on file    Threaten Physical Harm: Not on file    Scream or Curse: Not on file    Past Surgical History:  Procedure Laterality Date   ESSURE TUBAL LIGATION     TONSILLECTOMY  1984   WISDOM TOOTH EXTRACTION      Family History  Problem Relation Age of Onset   Healthy Mother        Living   Healthy Father        Living   Healthy Brother        x1   Alzheimer's disease Maternal Grandmother    Diabetes Maternal Grandfather        Borderline   Alzheimer's disease Paternal Grandfather    Allergies Son        x1   COPD Maternal Aunt    COPD Maternal Uncle    Colon cancer Neg Hx    Rectal cancer Neg Hx    Stomach cancer Neg  Hx    Esophageal cancer Neg Hx     Allergies  Allergen Reactions   Other Anaphylaxis    Truffle Oils   Penicillins Anaphylaxis    Current Outpatient Medications on File Prior to Visit  Medication Sig Dispense Refill   albuterol  (PROVENTIL  HFA;VENTOLIN  HFA) 108 (90 Base) MCG/ACT inhaler Inhale 2 puffs into the lungs every 6 (six) hours as needed for wheezing or shortness of breath. 1 Inhaler 2   amLODipine  (NORVASC ) 10 MG tablet Take 1 tablet (10 mg  total) by mouth daily. Needs appt 30 tablet 0   benzonatate  (TESSALON ) 100 MG capsule Take 1 capsule (100 mg total) by mouth 3 (three) times daily as needed for cough. 30 capsule 0   buPROPion  (WELLBUTRIN  XL) 150 MG 24 hr tablet Take 1 tablet (150 mg total) by mouth daily. Needs appt 30 tablet 0   busPIRone  (BUSPAR ) 7.5 MG tablet Take 1 tablet (7.5 mg total) by mouth 2 (two) times daily. Needs appt 60 tablet 0   cetirizine (ZYRTEC) 10 MG tablet Take 10 mg by mouth daily.     FEROSUL 325 (65 Fe) MG tablet TAKE 1 TABLET BY MOUTH 2 TIMES A DAY 60 tablet 1   FLUoxetine  (PROZAC ) 20 MG capsule Take 3 capsules (60 mg total) by mouth daily. 270 capsule 0   fluticasone  (FLONASE ) 50 MCG/ACT nasal spray Place 2 sprays into both nostrils daily. 16 g 1   levocetirizine (XYZAL ) 5 MG tablet Take 1 tablet (5 mg total) by mouth every evening. 30 tablet 3   losartan  (COZAAR ) 100 MG tablet Take 1 tablet (100 mg total) by mouth daily. Needs appt 30 tablet 0   traZODone  (DESYREL ) 50 MG tablet Take 0.5 tablets (25 mg total) by mouth at bedtime as needed for sleep. 30 tablet 1   clonazePAM  (KLONOPIN ) 0.5 MG tablet Take 1 tablet (0.5 mg total) by mouth 2 (two) times daily as needed for anxiety. (Patient not taking: Reported on 07/19/2024) 14 tablet 0   ezetimibe  (ZETIA ) 10 MG tablet Take 1 tablet (10 mg total) by mouth daily. (Patient not taking: Reported on 07/19/2024) 30 tablet 3   FEROSUL 325 (65 Fe) MG tablet TAKE 1 TABLET BY MOUTH 3 TIMES DAILY (Patient not taking:  Reported on 07/19/2024) 90 tablet 1   hydrOXYzine  (ATARAX /VISTARIL ) 10 MG tablet TAKE ONE TABLET BY MOUTH THREE TIMES DAILY AS NEEDED FOR ITCHING (Patient not taking: Reported on 07/19/2024) 30 tablet 0   nicotine  (NICODERM CQ  - DOSED IN MG/24 HR) 7 mg/24hr patch Place 1 patch (7 mg total) onto the skin daily. (Patient not taking: Reported on 07/19/2024) 28 patch 0   pantoprazole  (PROTONIX ) 40 MG tablet Take 1 tablet (40 mg total) by mouth daily. (Patient not taking: Reported on 07/19/2024) 90 tablet 4   potassium chloride  SA (KLOR-CON  M) 20 MEQ tablet Take 1 tablet (20 mEq total) by mouth 3 (three) times daily. (Patient not taking: Reported on 07/19/2024) 15 tablet 0   rosuvastatin  (CRESTOR ) 10 MG tablet TAKE 1 TABLET BY MOUTH EVERY DAY FOR CHOLESTEROL (Patient not taking: Reported on 07/19/2024) 30 tablet 3   No current facility-administered medications on file prior to visit.    BP 124/78   Pulse 83   Temp 98.3 F (36.8 C) (Oral)   Resp 16   Ht 5' 4 (1.626 m)   Wt 209 lb 6.4 oz (95 kg)   SpO2 96%   BMI 35.94 kg/m        Objective:   Physical Exam  General Mental Status- Alert. General Appearance- Not in acute distress.   Skin General: Color- Normal Color. Moisture- Normal Moisture.  Neck Carotid Arteries- Normal color. Moisture- Normal Moisture. No carotid bruits. No JVD.  Chest and Lung Exam Auscultation: Breath Sounds:-Normal.  Cardiovascular Auscultation:Rythm- Regular. Murmurs & Other Heart Sounds:Auscultation of the heart reveals- No Murmurs.  Abdomen Inspection:-Inspeection Normal. Palpation/Percussion:Note:No mass. Palpation and Percussion of the abdomen reveal- Non Tender, Non Distended + BS, no rebound or guarding.   Neurologic Cranial Nerve exam:-  CN III-XII intact(No nystagmus), symmetric smile. Strength:- 5/5 equal and symmetric strength both upper and lower extremities.       Assessment & Plan:   Patient Instructions  Obesity BMI 35, discussed  metformin for weight loss if A1c indicates prediabetes or diabetes. Emphasized lifestyle modifications. - Order A1c to assess for prediabetes or diabetes. - Consider prescribing metformin based on A1c results.  Fatty liver disease with elevated liver enzymes Discussed reducing alcohol intake and avoiding fructose and fatty foods. Consider metformin if A1c indicates prediabetes or diabetes. - Order metabolic panel to assess liver enzymes. - Advise reduction of alcohol intake. - Advise avoidance of fructose and fatty foods.  Excessive alcohol use(has made alot of progress) Discussed benefits of reducing alcohol intake. Consider acamprosate for cravings, insurance may require formal diagnosis. Discussed THC gummies as an alternative. - Consider acamprosate if unable to reduce alcohol intake. - Encourage reduction of alcohol intake. - Consider trial of THC gummies to reduce alcohol consumption.  Tobacco use disorder Discussed health benefits of quitting smoking and CT lung cancer screening due to smoking history. - Order CT lung cancer screening. - Encourage smoking cessation.  General Health Maintenance Discussed immunizations and routine screenings. She is due for a mammogram next month and a colonoscopy after the first of the year. Up to date on pneumonia and tetanus vaccines. Plans to receive flu and shingles vaccines this month. - Schedule mammogram for next month. - Schedule colonoscopy after the first of the year. - Receive flu and shingles vaccines this month.  Follow up date to be determined after lab review.   Laneya Gasaway, PA-C

## 2024-07-19 NOTE — Patient Instructions (Addendum)
 Obesity BMI 35, discussed metformin for weight loss if A1c indicates prediabetes or diabetes. Emphasized lifestyle modifications. - Order A1c to assess for prediabetes or diabetes. - Consider prescribing metformin based on A1c results.  Fatty liver disease with elevated liver enzymes Discussed reducing alcohol intake and avoiding fructose and fatty foods. Consider metformin if A1c indicates prediabetes or diabetes. - Order metabolic panel to assess liver enzymes. - Advise reduction of alcohol intake. - Advise avoidance of fructose and fatty foods.  Excessive alcohol use(has made alot of progress) Discussed benefits of reducing alcohol intake. Consider acamprosate for cravings, insurance may require formal diagnosis. Discussed THC gummies as an alternative. - Consider acamprosate if unable to reduce alcohol intake. - Encourage reduction of alcohol intake. - Consider trial of THC gummies to reduce alcohol consumption.  Tobacco use disorder Discussed health benefits of quitting smoking and CT lung cancer screening due to smoking history. - Order CT lung cancer screening. - Encourage smoking cessation.  General Health Maintenance Discussed immunizations and routine screenings. She is due for a mammogram next month and a colonoscopy after the first of the year. Up to date on pneumonia and tetanus vaccines. Plans to receive flu and shingles vaccines this month. - Schedule mammogram for next month. - Schedule colonoscopy after the first of the year. - Receive flu and shingles vaccines this month.  Follow up date to be determined after lab review.

## 2024-07-20 LAB — COMPREHENSIVE METABOLIC PANEL WITH GFR
ALT: 60 U/L — ABNORMAL HIGH (ref 0–35)
AST: 103 U/L — ABNORMAL HIGH (ref 0–37)
Albumin: 4.5 g/dL (ref 3.5–5.2)
Alkaline Phosphatase: 77 U/L (ref 39–117)
BUN: 9 mg/dL (ref 6–23)
CO2: 26 meq/L (ref 19–32)
Calcium: 9.6 mg/dL (ref 8.4–10.5)
Chloride: 100 meq/L (ref 96–112)
Creatinine, Ser: 0.66 mg/dL (ref 0.40–1.20)
GFR: 95.16 mL/min (ref >60.00)
Glucose, Bld: 99 mg/dL (ref 70–99)
Potassium: 3.8 meq/L (ref 3.5–5.1)
Sodium: 138 meq/L (ref 135–145)
Total Bilirubin: 0.4 mg/dL (ref 0.2–1.2)
Total Protein: 7.4 g/dL (ref 6.0–8.3)

## 2024-07-20 LAB — HEMOGLOBIN A1C: Hgb A1c MFr Bld: 6.1 % (ref 4.6–6.5)

## 2024-07-21 ENCOUNTER — Ambulatory Visit: Payer: Self-pay | Admitting: Medical

## 2024-08-03 ENCOUNTER — Other Ambulatory Visit: Payer: Self-pay | Admitting: Medical

## 2024-08-18 ENCOUNTER — Ambulatory Visit (HOSPITAL_BASED_OUTPATIENT_CLINIC_OR_DEPARTMENT_OTHER)
Admission: RE | Admit: 2024-08-18 | Discharge: 2024-08-18 | Disposition: A | Discharge status: Home / Self Care | Source: Ambulatory Visit | Attending: Medical | Admitting: Medical

## 2024-08-18 DIAGNOSIS — F17200 Nicotine dependence, unspecified, uncomplicated: Secondary | ICD-10-CM | POA: Diagnosis present

## 2024-08-18 DIAGNOSIS — Z122 Encounter for screening for malignant neoplasm of respiratory organs: Secondary | ICD-10-CM | POA: Insufficient documentation

## 2024-08-30 ENCOUNTER — Other Ambulatory Visit: Payer: Self-pay | Admitting: Medical

## 2024-10-18 ENCOUNTER — Ambulatory Visit: Payer: Self-pay

## 2024-10-18 NOTE — Telephone Encounter (Signed)
 Appointment scheduled.

## 2024-10-18 NOTE — Telephone Encounter (Signed)
 FYI Only or Action Required?: FYI only for provider: appointment scheduled on 12/18.  Patient was last seen in primary care on 07/19/2024 by Dorina Loving, PA-C.  Called Nurse Triage reporting Sore Throat.  Symptoms began a week ago.  Interventions attempted: OTC medications: mucinex and Prescription medications: prednisone , nasacort.  Symptoms are: gradually worsening.  Triage Disposition: See Physician Within 24 Hours  Patient/caregiver understands and will follow disposition?: Yes  Message from [facility] sent at 10/18/2024  8:07 AM EST  Reason for Triage: Patient has itchy throat swelling glands no pain just body aches   Reason for Disposition  [1] Tender node in the neck AND [2] also has a sore throat AND [3] minimal/no runny nose or cough  Answer Assessment - Initial Assessment Questions Last week was seen at [facility]- Nasacort, prednisone , flonase   Strep, covid and flu were negative-  Throat is scratching- tongue hurts from scratching throat Ears itching- right is draining a lot of wax- dark orange- denies pain just itching  Lymph nodes are swollen and visibly noticeable from across the room.  Denies SOB, CP, Dizziness, or fever.   Appt with PCP Thursday- advised UC in the interim to assess her glands and itchy throat. Benadryl for itching and any swelling. ED precautions understood   1. ONSET: When did the throat start hurting? (Hours or days ago)      Weeks  2. SEVERITY: How bad is the sore throat? (Scale 1-10; mild, moderate or severe)     Visible from afar-  3. STREP EXPOSURE: Has there been any exposure to strep within the past week? If Yes, ask: What type of contact occurred?      denies 4.  VIRAL SYMPTOMS: Are there any symptoms of a cold, such as a runny nose, cough, hoarse voice or red eyes?      Runny nose (blood streaked mucous), itchy throat 5. FEVER: Do you have a fever? If Yes, ask: What is your temperature, how was it measured, and when  did it start?     denies 6. PUS ON THE TONSILS: Is there pus on the tonsils in the back of your throat?     Doesn't have tonsils anymore- just red throat 7. OTHER SYMPTOMS: Do you have any other symptoms? (e.g., difficulty breathing, headache, rash)     Swollen lymph nodes  Answer Assessment - Initial Assessment Questions 1. LOCATION: Where is the swollen node located? Is the matching node on the other side of the body also swollen?      Bilateral neck lymph nodes 2. SIZE: How big is the node? (e.g., inches or centimeters; or compared to common objects such as pea, bean, marble, golf ball)      Visibly swollen- noticed by coworkeds 3. ONSET: When did the swelling start?      This morning  4. NECK NODES: Is there a sore throat, runny nose or other symptoms of a cold?      Cold symptoms, runny nose, sore throat 6. FEVER: Do you have a fever? If Yes, ask: What is it, how was it measured, and when did it start?      denies 7. CAUSE: What do you think is causing the swollen lymph nodes?     URI 8. OTHER SYMPTOMS: Do you have any other symptoms? (e.g., node is tender to touch, skin redness over node, weight changes)     Tender to touch. Itching throat, ear drainage  Protocols used: Sore Throat-A-AH, Lymph Nodes - Swollen-A-AH

## 2024-10-20 ENCOUNTER — Ambulatory Visit: Admitting: Medical

## 2024-11-01 ENCOUNTER — Other Ambulatory Visit: Payer: Self-pay | Admitting: Medical

## 2024-11-18 ENCOUNTER — Encounter: Payer: Self-pay | Admitting: Medical

## 2024-11-18 ENCOUNTER — Ambulatory Visit (INDEPENDENT_AMBULATORY_CARE_PROVIDER_SITE_OTHER): Admitting: Medical

## 2024-11-18 VITALS — BP 130/70 | HR 83 | Temp 97.7°F | Resp 16 | Ht 64.0 in | Wt 193.2 lb

## 2024-11-18 DIAGNOSIS — R9230 Dense breasts, unspecified: Secondary | ICD-10-CM

## 2024-11-18 DIAGNOSIS — R110 Nausea: Secondary | ICD-10-CM

## 2024-11-18 DIAGNOSIS — E669 Obesity, unspecified: Secondary | ICD-10-CM | POA: Diagnosis not present

## 2024-11-18 DIAGNOSIS — R739 Hyperglycemia, unspecified: Secondary | ICD-10-CM

## 2024-11-18 DIAGNOSIS — Z1211 Encounter for screening for malignant neoplasm of colon: Secondary | ICD-10-CM | POA: Diagnosis not present

## 2024-11-18 DIAGNOSIS — Z0000 Encounter for general adult medical examination without abnormal findings: Secondary | ICD-10-CM | POA: Diagnosis not present

## 2024-11-18 DIAGNOSIS — Z1231 Encounter for screening mammogram for malignant neoplasm of breast: Secondary | ICD-10-CM | POA: Diagnosis not present

## 2024-11-18 NOTE — Patient Instructions (Addendum)
 For you wellness exam today I have ordered cbc, cmp and lipid panel..  Elevated sugar get A1c.  Placed order for diagnostic mammogram  Follow thru with repeat pap.  Vaccines up to date.  Placed referral for colonoscopy  Recommend exercise and healthy diet.  We will let you know lab results as they come in.  Follow up date appointment will be determined after lab review.    Obesity Currently on Wegovy with 13-pound weight loss(medi-v) on line service. No significant side effects except nausea when she  overeats  - Continue Wegovy regimen, two injections per week. - Monitor for side effects or contraindications such as pancreatitis.   Preventive Care 34-60 Years Old, Female Preventive care refers to lifestyle choices and visits with your health care provider that can promote health and wellness. Preventive care visits are also called wellness exams. What can I expect for my preventive care visit? Counseling Your health care provider may ask you questions about your: Medical history, including: Past medical problems. Family medical history. Pregnancy history. Current health, including: Menstrual cycle. Method of birth control. Emotional well-being. Home life and relationship well-being. Sexual activity and sexual health. Lifestyle, including: Alcohol, nicotine  or tobacco, and drug use. Access to firearms. Diet, exercise, and sleep habits. Work and work astronomer. Sunscreen use. Safety issues such as seatbelt and bike helmet use. Physical exam Your health care provider will check your: Height and weight. These may be used to calculate your BMI (body mass index). BMI is a measurement that tells if you are at a healthy weight. Waist circumference. This measures the distance around your waistline. This measurement also tells if you are at a healthy weight and may help predict your risk of certain diseases, such as type 2 diabetes and high blood pressure. Heart rate and  blood pressure. Body temperature. Skin for abnormal spots. What immunizations do I need?  Vaccines are usually given at various ages, according to a schedule. Your health care provider will recommend vaccines for you based on your age, medical history, and lifestyle or other factors, such as travel or where you work. What tests do I need? Screening Your health care provider may recommend screening tests for certain conditions. This may include: Lipid and cholesterol levels. Diabetes screening. This is done by checking your blood sugar (glucose) after you have not eaten for a while (fasting). Pelvic exam and Pap test. Hepatitis B test. Hepatitis C test. HIV (human immunodeficiency virus) test. STI (sexually transmitted infection) testing, if you are at risk. Lung cancer screening. Colorectal cancer screening. Mammogram. Talk with your health care provider about when you should start having regular mammograms. This may depend on whether you have a family history of breast cancer. BRCA-related cancer screening. This may be done if you have a family history of breast, ovarian, tubal, or peritoneal cancers. Bone density scan. This is done to screen for osteoporosis. Talk with your health care provider about your test results, treatment options, and if necessary, the need for more tests. Follow these instructions at home: Eating and drinking  Eat a diet that includes fresh fruits and vegetables, whole grains, lean protein, and low-fat dairy products. Take vitamin and mineral supplements as recommended by your health care provider. Do not drink alcohol if: Your health care provider tells you not to drink. You are pregnant, may be pregnant, or are planning to become pregnant. If you drink alcohol: Limit how much you have to 0-1 drink a day. Know how much alcohol is in your  drink. In the [place] one drink equals one 12 oz bottle of beer (355 mL), one 5 oz glass of wine (148 mL), or one 1 oz  glass of hard liquor (44 mL). Lifestyle Brush your teeth every morning and night with fluoride toothpaste. Floss one time each day. Exercise for at least 30 minutes 5 or more days each week. Do not use any products that contain nicotine  or tobacco. These products include cigarettes, chewing tobacco, and vaping devices, such as e-cigarettes. If you need help quitting, ask your health care provider. Do not use drugs. If you are sexually active, practice safe sex. Use a condom or other form of protection to prevent STIs. If you do not wish to become pregnant, use a form of birth control. If you plan to become pregnant, see your health care provider for a prepregnancy visit. Take aspirin only as told by your health care provider. Make sure that you understand how much to take and what form to take. Work with your health care provider to find out whether it is safe and beneficial for you to take aspirin daily. Find healthy ways to manage stress, such as: Meditation, yoga, or listening to music. Journaling. Talking to a trusted person. Spending time with friends and family. Minimize exposure to UV radiation to reduce your risk of skin cancer. Safety Always wear your seat belt while driving or riding in a vehicle. Do not drive: If you have been drinking alcohol. Do not ride with someone who has been drinking. When you are tired or distracted. While texting. If you have been using any mind-altering substances or drugs. Wear a helmet and other protective equipment during sports activities. If you have firearms in your house, make sure you follow all gun safety procedures. Seek help if you have been physically or sexually abused. What's next? Visit your health care provider once a year for an annual wellness visit. Ask your health care provider how often you should have your eyes and teeth checked. Stay up to date on all vaccines. This information is not intended to replace advice given to you by  your health care provider. Make sure you discuss any questions you have with your health care provider. Document Revised: 04/17/2021 Document Reviewed: 04/17/2021 Elsevier Patient Education  2024 [employer].

## 2024-11-18 NOTE — Progress Notes (Signed)
 "  Subjective:    Patient ID: Lauren Case, female    DOB: 08/30/1966, 59 y.o.   MRN: [id number]  HPI  Pt in for wellness exam.   Pt is not fasting. Pt works for [employer].      Also she states had mammogram done. [id number]. Negative but had to get US  for clarfiication. Pt did not follow thru and I placed order today to get diagnostic mammogram order today as they stated she do that along with US .    On review she smoked for 40 years. For 20 years of that she smoked pack a day. Last 20 years average 6 cigarettes a day. Pt not yet due for ct screening lung cancer. Will be due oct 2026.   Need repeat pap. See last report in epic.    Pt  got 2nd shingrix  vaccine her. She states she remembers was worse than 1st one.     Lauren Case is a 59 year old female who presents for follow-up on weight management and medication use.  She has lost 13 pounds while using semaglutide [facility]) injections. She takes two injections per week but is unsure of the dose. She has nausea if she overeats. The medication costs $300 per month.  She has dense breasts. She had a diagnostic mammogram and ultrasound in October with unremarkable results. A follow-up was recommended in April, which she missed.  She had bronchitis before Christmas and was then diagnosed with mononucleosis at urgent care. She had significant fatigue and still needs naps during her lunch hour at work. Energy now back to normal/fatigue resolved.  She is due for colonoscopy, with the last one 3 years ago. She is up to date on shingles and RSV vaccines given at her workplace clinic. She missed a recommended follow-up visit  with gyn after a Pap showed LSIL.  She lives with and cares for her father and finds these responsibilities overwhelming at times.    Review of Systems  Constitutional:  Negative for chills, fatigue and fever.  HENT:  Negative for congestion.   Respiratory:  Negative for cough, chest tightness and wheezing.    Cardiovascular:  Negative for chest pain and palpitations.  Gastrointestinal:  Negative for abdominal distention, diarrhea, nausea and vomiting.       No nausea presently.  Genitourinary:  Negative for dysuria and frequency.  Musculoskeletal:  Negative for back pain.  Neurological:  Negative for dizziness and headaches.  Hematological:  Negative for adenopathy.  Psychiatric/Behavioral:  Negative for behavioral problems and dysphoric mood. The patient is not nervous/anxious.     Past Medical History:  Diagnosis Date   Chicken pox    Environmental allergies    GERD (gastroesophageal reflux disease)    Hypertension    Measles    Mumps    Seasonal allergies    Vaginal Pap smear, abnormal      Social History   Socioeconomic History   Marital status: Single    Spouse name: Not on file   Number of children: 1   Years of education: Not on file   Highest education level: 12th grade  Occupational History   Occupation: Operations Coodinator  Tobacco Use   Smoking status: Every Day    Current packs/day: 1.00    Average packs/day: 1 pack/day for 30.0 years (30.0 ttl pk-yrs)    Types: Cigarettes   Smokeless tobacco: Never  Vaping Use   Vaping status: Never Used  Substance and Sexual Activity   Alcohol use:  Yes    Alcohol/week: 10.0 standard drinks of alcohol    Types: 10 Shots of liquor per week    Comment: daily   Drug use: No   Sexual activity: Never    Birth control/protection: Surgical  Other Topics Concern   Not on file  Social History Narrative   Not on file   Social Drivers of Health   Tobacco Use: High Risk (11/18/2024)   Patient History    Smoking Tobacco Use: Every Day    Smokeless Tobacco Use: Never    Passive Exposure: Not on file  Financial Resource Strain: Low Risk (11/14/2024)   Overall Financial Resource Strain (CARDIA)    Difficulty of Paying Living Expenses: Not very hard  Food Insecurity: No Food Insecurity (11/14/2024)   Epic    Worried About  Programme Researcher, Broadcasting/film/video in the Last Year: Never true    Ran Out of Food in the Last Year: Never true  Transportation Needs: No Transportation Needs (11/14/2024)   Epic    Lack of Transportation (Medical): No    Lack of Transportation (Non-Medical): No  Physical Activity: Insufficiently Active (11/14/2024)   Exercise Vital Sign    Days of Exercise per Week: 4 days    Minutes of Exercise per Session: 20 min  Stress: No Stress Concern Present (11/14/2024)   [employer] of Occupational Health - Occupational Stress Questionnaire    Feeling of Stress: Only a little  Social Connections: Moderately Isolated (11/14/2024)   Social Connection and Isolation Panel    Frequency of Communication with Friends and Family: More than three times a week    Frequency of Social Gatherings with Friends and Family: Once a week    Attends Religious Services: More than 4 times per year    Active Member of [employer] or Organizations: No    Attends Engineer, Structural: Not on file    Marital Status: Never married  Intimate Partner Violence: Unknown (02/07/2022)   Received from [facility]   HITS    Physically Hurt: Not on file    Insult or Talk Down To: Not on file    Threaten Physical Harm: Not on file    Scream or Curse: Not on file  Depression (PHQ2-9): Low Risk (11/18/2024)   Depression (PHQ2-9)    PHQ-2 Score: 2  Alcohol Screen: High Risk (11/14/2024)   Alcohol Screen    Last Alcohol Screening Score (AUDIT): 17  Housing: Low Risk (11/14/2024)   Epic    Unable to Pay for Housing in the Last Year: No    Number of Times Moved in the Last Year: 0    Homeless in the Last Year: No  Utilities: Not on file  Health Literacy: Not on file    Past Surgical History:  Procedure Laterality Date   ESSURE TUBAL LIGATION     TONSILLECTOMY  1984   WISDOM TOOTH EXTRACTION      Family History  Problem Relation Age of Onset   Healthy Mother        Living   Healthy Father        Living   Healthy Brother         x1   Alzheimer's disease Maternal Grandmother    Diabetes Maternal Grandfather        Borderline   Alzheimer's disease Paternal Grandfather    Allergies Son        x1   COPD Maternal Aunt    COPD Maternal Uncle  Colon cancer Neg Hx    Rectal cancer Neg Hx    Stomach cancer Neg Hx    Esophageal cancer Neg Hx     Allergies[1]  Medications Ordered Prior to Encounter[2]  BP 106/70   Pulse 83   Temp 97.7 F (36.5 C) (Oral)   Resp 16   Ht 5' 4 (1.626 m)   Wt 193 lb 3.2 oz (87.6 kg)   SpO2 96%   BMI 33.16 kg/m   Bp recheck 130/70        Objective:   Physical Exam General Mental Status- Alert. General Appearance- Not in acute distress.   Skin General: Color- Normal Color. Moisture- Normal Moisture.  Neck Carotid Arteries- Normal color. Moisture- Normal Moisture. No carotid bruits. No JVD.  Chest and Lung Exam Auscultation: Breath Sounds:-Normal.  Cardiovascular Auscultation:Rythm- Regular. Murmurs & Other Heart Sounds:Auscultation of the heart reveals- No Murmurs.  Abdomen Inspection:-Inspeection Normal. Palpation/Percussion:Note:No mass. Palpation and Percussion of the abdomen reveal- Non Tender, Non Distended + BS, no rebound or guarding.   Neurologic Cranial Nerve exam:- CN III-XII intact(No nystagmus), symmetric smile. Strength:- 5/5 equal and symmetric strength both upper and lower extremities.        Assessment & Plan:   For you wellness exam today I have ordered cbc, cmp and lipid panel..  Elevated sugar get A1c.  Placed order for diagnostic mammogram  Follow thru with repeat pap.  Vaccines up to date.  Placed referral for colonoscopy  Recommend exercise and healthy diet.  We will let you know lab results as they come in.  Follow up date appointment will be determined after lab review.    Obesity Currently on Wegovy with 13-pound weight loss(medi-v) on line service. No significant side effects except nausea when she   overeats  - Continue Wegovy regimen, two injections per week. - Monitor for side effects or contraindications such as pancreatitis.  Dallas Maxwell, PA-C      [1]  Allergies Allergen Reactions   Other Anaphylaxis    Truffle Oils   Penicillins Anaphylaxis  [2]  Current Outpatient Medications on File Prior to Visit  Medication Sig Dispense Refill   albuterol  (PROVENTIL  HFA;VENTOLIN  HFA) 108 (90 Base) MCG/ACT inhaler Inhale 2 puffs into the lungs every 6 (six) hours as needed for wheezing or shortness of breath. 1 Inhaler 2   amLODipine  (NORVASC ) 10 MG tablet Take 1 tablet (10 mg total) by mouth daily. 90 tablet 1   benzonatate  (TESSALON ) 100 MG capsule Take 1 capsule (100 mg total) by mouth 3 (three) times daily as needed for cough. 30 capsule 0   buPROPion  (WELLBUTRIN  XL) 150 MG 24 hr tablet Take 1 tablet (150 mg total) by mouth daily. 90 tablet 1   busPIRone  (BUSPAR ) 7.5 MG tablet Take 1 tablet (7.5 mg total) by mouth 2 (two) times daily. 180 tablet 0   cetirizine (ZYRTEC) 10 MG tablet Take 10 mg by mouth daily.     FEROSUL 325 (65 Fe) MG tablet TAKE 1 TABLET BY MOUTH 2 TIMES A DAY 60 tablet 1   FLUoxetine  (PROZAC ) 20 MG capsule Take 3 capsules (60 mg total) by mouth daily. 270 capsule 0   fluticasone  (FLONASE ) 50 MCG/ACT nasal spray Place 2 sprays into both nostrils daily. 16 g 1   levocetirizine (XYZAL ) 5 MG tablet Take 1 tablet (5 mg total) by mouth every evening. 30 tablet 3   losartan  (COZAAR ) 100 MG tablet TAKE 1 TABLET (100 MG TOTAL) BY MOUTH DAILY. 90 tablet 1  SEMAGLUTIDE-WEIGHT MANAGEMENT [place] Inject 1.12 mg into the skin 2 (two) times a week.     traZODone  (DESYREL ) 50 MG tablet Take 0.5 tablets (25 mg total) by mouth at bedtime as needed for sleep. 30 tablet 1   clonazePAM  (KLONOPIN ) 0.5 MG tablet Take 1 tablet (0.5 mg total) by mouth 2 (two) times daily as needed for anxiety. (Patient not taking: Reported on 11/18/2024) 14 tablet 0   ezetimibe  (ZETIA ) 10 MG tablet Take 1  tablet (10 mg total) by mouth daily. (Patient not taking: Reported on 11/18/2024) 30 tablet 3   FEROSUL 325 (65 Fe) MG tablet TAKE 1 TABLET BY MOUTH 3 TIMES DAILY (Patient not taking: Reported on 11/18/2024) 90 tablet 1   hydrOXYzine  (ATARAX /VISTARIL ) 10 MG tablet TAKE ONE TABLET BY MOUTH THREE TIMES DAILY AS NEEDED FOR ITCHING (Patient not taking: Reported on 11/18/2024) 30 tablet 0   nicotine  (NICODERM CQ  - DOSED IN MG/24 HR) 7 mg/24hr patch Place 1 patch (7 mg total) onto the skin daily. (Patient not taking: Reported on 11/18/2024) 28 patch 0   pantoprazole  (PROTONIX ) 40 MG tablet Take 1 tablet (40 mg total) by mouth daily. (Patient not taking: Reported on 11/18/2024) 90 tablet 4   potassium chloride  SA (KLOR-CON  M) 20 MEQ tablet Take 1 tablet (20 mEq total) by mouth 3 (three) times daily. (Patient not taking: Reported on 11/18/2024) 15 tablet 0   rosuvastatin  (CRESTOR ) 10 MG tablet TAKE 1 TABLET BY MOUTH EVERY DAY FOR CHOLESTEROL (Patient not taking: Reported on 11/18/2024) 30 tablet 3   No current facility-administered medications on file prior to visit.   "

## 2024-11-22 ENCOUNTER — Other Ambulatory Visit: Payer: Self-pay | Admitting: Medical

## 2024-11-22 DIAGNOSIS — E669 Obesity, unspecified: Secondary | ICD-10-CM

## 2024-11-22 DIAGNOSIS — Z1231 Encounter for screening mammogram for malignant neoplasm of breast: Secondary | ICD-10-CM

## 2024-11-22 DIAGNOSIS — R110 Nausea: Secondary | ICD-10-CM

## 2024-11-22 DIAGNOSIS — Z1211 Encounter for screening for malignant neoplasm of colon: Secondary | ICD-10-CM

## 2024-11-22 DIAGNOSIS — R739 Hyperglycemia, unspecified: Secondary | ICD-10-CM

## 2024-11-22 DIAGNOSIS — Z0000 Encounter for general adult medical examination without abnormal findings: Secondary | ICD-10-CM

## 2024-11-22 DIAGNOSIS — N6489 Other specified disorders of breast: Secondary | ICD-10-CM

## 2024-11-22 DIAGNOSIS — R9230 Dense breasts, unspecified: Secondary | ICD-10-CM

## 2024-11-23 ENCOUNTER — Telehealth: Payer: Self-pay

## 2024-11-23 NOTE — Telephone Encounter (Signed)
 Called pt to see when she could come back and get her labs done so that I can complete her physical forms for work. Was able to get her scheduled for lab tomorrow 11/24/24

## 2024-11-24 ENCOUNTER — Other Ambulatory Visit (INDEPENDENT_AMBULATORY_CARE_PROVIDER_SITE_OTHER)

## 2024-11-24 DIAGNOSIS — Z13220 Encounter for screening for lipoid disorders: Secondary | ICD-10-CM | POA: Diagnosis not present

## 2024-11-24 DIAGNOSIS — R110 Nausea: Secondary | ICD-10-CM

## 2024-11-24 DIAGNOSIS — R739 Hyperglycemia, unspecified: Secondary | ICD-10-CM

## 2024-11-24 DIAGNOSIS — Z0000 Encounter for general adult medical examination without abnormal findings: Secondary | ICD-10-CM | POA: Diagnosis not present

## 2024-11-24 LAB — CBC WITH DIFFERENTIAL/PLATELET
Basophils Absolute: 0.1 K/uL (ref 0.0–0.1)
Basophils Relative: 0.7 % (ref 0.0–3.0)
Eosinophils Absolute: 0.1 K/uL (ref 0.0–0.7)
Eosinophils Relative: 0.8 % (ref 0.0–5.0)
HCT: 42.6 % (ref 36.0–46.0)
Hemoglobin: 13.6 g/dL (ref 12.0–15.0)
Lymphocytes Relative: 22.1 % (ref 12.0–46.0)
Lymphs Abs: 1.7 K/uL (ref 0.7–4.0)
MCHC: 32 g/dL (ref 30.0–36.0)
MCV: 81.8 fl (ref 78.0–100.0)
Monocytes Absolute: 0.6 K/uL (ref 0.1–1.0)
Monocytes Relative: 7.5 % (ref 3.0–12.0)
Neutro Abs: 5.4 K/uL (ref 1.4–7.7)
Neutrophils Relative %: 68.9 % (ref 43.0–77.0)
Platelets: 156 K/uL (ref 150.0–400.0)
RBC: 5.21 Mil/uL — ABNORMAL HIGH (ref 3.87–5.11)
RDW: 18.5 % — ABNORMAL HIGH (ref 11.5–15.5)
WBC: 7.9 K/uL (ref 4.0–10.5)

## 2024-11-24 LAB — LIPID PANEL
Cholesterol: 196 mg/dL (ref 28–200)
HDL: 47.7 mg/dL
LDL Cholesterol: 123 mg/dL — ABNORMAL HIGH (ref 10–99)
NonHDL: 148.37
Total CHOL/HDL Ratio: 4
Triglycerides: 129 mg/dL (ref 10.0–149.0)
VLDL: 25.8 mg/dL (ref 0.0–40.0)

## 2024-11-24 LAB — COMPREHENSIVE METABOLIC PANEL WITH GFR
ALT: 41 U/L — ABNORMAL HIGH (ref 3–35)
AST: 94 U/L — ABNORMAL HIGH (ref 5–37)
Albumin: 4.2 g/dL (ref 3.5–5.2)
Alkaline Phosphatase: 73 U/L (ref 39–117)
BUN: 6 mg/dL (ref 6–23)
CO2: 32 meq/L (ref 19–32)
Calcium: 9.3 mg/dL (ref 8.4–10.5)
Chloride: 98 meq/L (ref 96–112)
Creatinine, Ser: 0.62 mg/dL (ref 0.40–1.20)
GFR: 96.36 mL/min
Glucose, Bld: 99 mg/dL (ref 70–99)
Potassium: 3.1 meq/L — ABNORMAL LOW (ref 3.5–5.1)
Sodium: 141 meq/L (ref 135–145)
Total Bilirubin: 0.5 mg/dL (ref 0.2–1.2)
Total Protein: 7.3 g/dL (ref 6.0–8.3)

## 2024-11-24 LAB — LIPASE: Lipase: 32 U/L (ref 11.0–59.0)

## 2024-11-24 LAB — HEMOGLOBIN A1C: Hgb A1c MFr Bld: 5.3 % (ref 4.6–6.5)

## 2024-11-25 ENCOUNTER — Telehealth: Payer: Self-pay

## 2024-11-25 NOTE — Telephone Encounter (Signed)
 Work physical form faxed with confirmation received

## 2024-11-26 ENCOUNTER — Ambulatory Visit: Payer: Self-pay | Admitting: Medical

## 2024-11-26 MED ORDER — POTASSIUM CHLORIDE CRYS ER 10 MEQ PO TBCR: 10.0000 meq | EXTENDED_RELEASE_TABLET | Freq: Every day | ORAL | 0 refills | Status: AC

## 2024-11-26 NOTE — Addendum Note (Signed)
 Addended by: DORINA DALLAS DORINA PA-C M on: 11/26/2024 08:17 AM   Modules accepted: Orders

## 2024-11-29 NOTE — Progress Notes (Signed)
 Last read by Jon Morrison Knueppel at 9:09AM on 11/29/2024.

## 2024-11-30 ENCOUNTER — Other Ambulatory Visit: Payer: Self-pay | Admitting: Medical

## 2024-12-01 NOTE — Telephone Encounter (Signed)
 Sent in pt prozac  and buspar . Want pt to schedule follow up visit in June

## 2024-12-07 ENCOUNTER — Ambulatory Visit: Payer: Self-pay | Admitting: Medical

## 2024-12-07 ENCOUNTER — Ambulatory Visit
Admission: RE | Admit: 2024-12-07 | Discharge: 2024-12-07 | Disposition: A | Source: Ambulatory Visit | Attending: Medical | Admitting: Medical

## 2024-12-07 DIAGNOSIS — N6489 Other specified disorders of breast: Secondary | ICD-10-CM

## 2024-12-22 ENCOUNTER — Other Ambulatory Visit
